# Patient Record
Sex: Male | Born: 1935 | Race: White | Hispanic: No | Marital: Married | Smoking: Former smoker
Health system: Southern US, Community
[De-identification: ages and names within clinical notes are randomized; demographics above are authoritative.]

## PROBLEM LIST (undated history)

## (undated) DIAGNOSIS — I639 Cerebral infarction, unspecified: Secondary | ICD-10-CM

## (undated) DIAGNOSIS — J45909 Unspecified asthma, uncomplicated: Secondary | ICD-10-CM

## (undated) DIAGNOSIS — E119 Type 2 diabetes mellitus without complications: Secondary | ICD-10-CM

## (undated) DIAGNOSIS — K4031 Unilateral inguinal hernia, with obstruction, without gangrene, recurrent: Secondary | ICD-10-CM

## (undated) DIAGNOSIS — R42 Dizziness and giddiness: Secondary | ICD-10-CM

## (undated) DIAGNOSIS — E162 Hypoglycemia, unspecified: Secondary | ICD-10-CM

## (undated) HISTORY — PX: HAND SURGERY: SHX662

## (undated) HISTORY — PX: APPENDECTOMY: SHX54

## (undated) HISTORY — PX: TONSILLECTOMY: SUR1361

## (undated) HISTORY — PX: CHOLECYSTECTOMY: SHX55

## (undated) HISTORY — PX: OTHER SURGICAL HISTORY: SHX169

## (undated) HISTORY — PX: HERNIA REPAIR: SHX51

## (undated) HISTORY — PX: EYE SURGERY: SHX253

---

## 1968-07-10 HISTORY — PX: LAPAROSCOPIC GASTROTOMY W/ REPAIR OF ULCER: SUR772

## 1999-05-17 ENCOUNTER — Encounter: Admission: RE | Admit: 1999-05-17 | Discharge: 1999-05-17 | Payer: Self-pay | Admitting: Gastroenterology

## 1999-05-17 ENCOUNTER — Encounter: Payer: Self-pay | Admitting: Gastroenterology

## 1999-10-18 ENCOUNTER — Ambulatory Visit (HOSPITAL_COMMUNITY): Admission: RE | Admit: 1999-10-18 | Discharge: 1999-10-18 | Payer: Self-pay | Admitting: Gastroenterology

## 2001-10-15 ENCOUNTER — Encounter (INDEPENDENT_AMBULATORY_CARE_PROVIDER_SITE_OTHER): Payer: Self-pay | Admitting: Specialist

## 2001-10-15 ENCOUNTER — Ambulatory Visit (HOSPITAL_BASED_OUTPATIENT_CLINIC_OR_DEPARTMENT_OTHER): Admission: RE | Admit: 2001-10-15 | Discharge: 2001-10-15 | Payer: Self-pay | Admitting: Orthopedic Surgery

## 2004-08-23 ENCOUNTER — Ambulatory Visit (HOSPITAL_COMMUNITY): Admission: RE | Admit: 2004-08-23 | Discharge: 2004-08-23 | Payer: Self-pay | Admitting: Gastroenterology

## 2006-07-10 HISTORY — PX: DENTAL SURGERY: SHX609

## 2007-09-23 ENCOUNTER — Encounter: Admission: RE | Admit: 2007-09-23 | Discharge: 2007-09-23 | Payer: Self-pay | Admitting: Orthopedic Surgery

## 2007-09-26 ENCOUNTER — Ambulatory Visit (HOSPITAL_BASED_OUTPATIENT_CLINIC_OR_DEPARTMENT_OTHER): Admission: RE | Admit: 2007-09-26 | Discharge: 2007-09-26 | Payer: Self-pay | Admitting: Orthopedic Surgery

## 2007-09-26 ENCOUNTER — Encounter (INDEPENDENT_AMBULATORY_CARE_PROVIDER_SITE_OTHER): Payer: Self-pay | Admitting: Orthopedic Surgery

## 2007-12-21 ENCOUNTER — Emergency Department: Payer: Self-pay | Admitting: Emergency Medicine

## 2009-11-06 ENCOUNTER — Emergency Department: Payer: Self-pay | Admitting: Emergency Medicine

## 2010-07-31 ENCOUNTER — Encounter: Payer: Self-pay | Admitting: Internal Medicine

## 2010-11-22 NOTE — Op Note (Signed)
Craig Vazquez, Craig Vazquez                ACCOUNT NO.:  [id number]   MEDICAL RECORD NO.:  [id number]          PATIENT TYPE:  AMB   LOCATION:  DSC                          FACILITY:  [facility]   PHYSICIAN:  Katy Fitch. Sypher, M.D. DATE OF BIRTH:  04-10-1931   DATE OF PROCEDURE:  09/26/2007  DATE OF DISCHARGE:                               OPERATIVE REPORT   PREOPERATIVE DIAGNOSIS:  Complex recurrent diffuse Dupuytren's  contracture left hand involving index, long, ring and small finger  status post prior surgery to index finger and prior surgeries for  primary excision of palmar fibromatosis.   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   OPERATION:  1. Resection of Dupuytren's contracture from left palm and ring      finger.  2. Resection of Dupuytren's contracture from left palm and small      finger.  3. Resection Dupuytren's contracture from palm and left long finger.   OPERATING SURGEON:  Katy Fitch. Sypher, M.D.   ASSISTANT:  Molly Maduro Dasnoit PA-C.   ANESTHESIA:  General by LMA without supplementary regional block.   SUPERVISING ANESTHESIOLOGIST:  Zenon Mayo, MD.   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  He has had four prior surgeries for palmar  fibromatosis.  His initial index surgeries were while serving in the  [employer].   He has had recurrence of his disease and now presents with profound  flexion contractures of his long, ring and small finger of the left hand  with extensive palmar disease.   Preoperatively, we advised him that due to his age of 26, the presence  of osteoarthrosis and his severe  persistent disease, he should not  expect correction of his flexion contractures.   Our goal was to palliate his predicament by removal of the nodular  palmar fibromatosis and relieve his MP and PIP flexion contractures as  much as technically possible.   Preoperatively, he was advised of the potential risks of joint stiffness  due to his underlying osteoarthritis.   Questions were invited and answered in detail both in the office and in  the holding area.   PROCEDURE:  Craig Vazquez is brought to the operating room and placed in  supine position upon the operating table.   Preoperatively, Dr. Sampson Goon provided anesthesia consult and offered a  regional block.  Craig Vazquez declined the block.   He is brought to room one of the surgical center, placed in supine  position on the operating table and under Dr. Jarrett Ables strict  supervision, general anesthesia by LMA technique induced.   One gram of Ancef was administered as an IV prophylactic antibiotic  followed by routine Betadine scrub and paint of left upper extremity.   The arm was draped with sterile stockinette and impervious arthroscopy  drapes followed by exsanguination of the limb with Esmarch bandage and  inflation __________ mmHg.   Procedure commenced with planning of Brunner zigzag incisions to be  extended to V-Y advancement flaps for skin lengthening on the palmar  surfaces of the fingers.   The skin flaps were taken sharply with scalpel and scissors dissection  exposing the palmar fascia.  Extensive dissection was accomplished in  the ring finger extending from the carpal canal distally to the DIP  joint and extensive nodular disease was present along the lateral  fascial sheets, spiral bands were present, central cords and extensive  involvement with the natatory ligaments.   After meticulous dissection, we  were able to fully correct the PIP  joint flexion contractures of the long, ring and small  fingers.   Due to the underlying osteoarthrosis, I elected to inject the PIP joints  of the long and ring fingers that were most swollen and involved with  osteoarthritis with Depo-Medrol 40 mg/mL and 1% lidocaine through both  the dorsal and palmar approach in an effort to minimize an arthritic  flare response following mobilization.   The tourniquet was released after 120 minutes and hemostasis achieved  with bipolar cautery and direct pressure.  The wounds were thoroughly  irrigated, followed by closure with corner sutures of 5-0 nylon and  multiple interrupted sutures of 5-0 nylon.   The wounds were dressed with Silvadene, Adaptic, sterile gauze, sterile  Webril and a volar plaster splint maintaining the MP and PIP joints in  full extension.  There were no apparent complications.   Upon tourniquet release, there was immediate capillary refill to all  fingers and normal turgor within 30 seconds.      Katy Fitch Sypher, M.D.  Electronically Signed     RVS/MEDQ  D:  09/26/2007  T:  09/26/2007  Job:  [id number]   cc:   [name], MD

## 2010-11-25 NOTE — Op Note (Signed)
[facility]  Patient:    Craig Vazquez, Craig Vazquez Visit Number: [id number] MRN: [id number]          Service Type: DSU Location: [facility] Attending Physician:  Susa Day Dictated by:   Katy Fitch Naaman Plummer., M.D. Proc. 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  OPERATION PERFORMED:  Excision of recurrent Dupuytrens disease, right small finger with V-Y advancement flaps for skin coverage x 3.  Proximal interphalangeal release with volar plate proximal release.  SURGEON:  Katy Fitch. Sypher, Montez Hageman., M.D.  ASSISTANT:  Jonni Sanger, P.A.  ANESTHESIA:  General by LMA.  SUPERVISING ANESTHESIOLOGIST:  Dr. Gelene Mink.  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  Approximately 30 years ago while in the [employer] hospital system, he had resection of an extensive Dupuytrens contracture from his right palm, long, ring and small fingers.  He did quite well for nearly 25 years but then had a severe recurrence in his small finger with development of a 90 degree flexion contracture of the PIP joint and hyperextension of the DIP joint with a pseudoboutonniere posture. He had intact sensibility to his fingertip and was able to flex his finger to the palm without full  flexion at the DIP joint due to contracture of the spiral oblique retinacular ligament.  He requested repeat excision.  Preoperatively we advised him that recurrent Dupuytrens and subsequent surgery for recurrent disease can be fraught with many complications including injury to nerves, blood vessels and a chance of recurrence of the disease rather promptly.  We discussed the possible need for skin grafting and possible neurovascular repairs.  Despite these risks and benefits, due to his degree of frustration with his finger, he proceeds at this time.  DESCRIPTION OF PROCEDURE:  Craig Vazquez was brought to the operating room and placed in supine position on the operating table.  Following induction of general anesthesia by LMA, the right arm was prepped with Betadine soap and solution and sterilely draped.  Following exsanguination of the limb with an Esmarch bandage, the arterial tourniquet on the proximal brachium was inflated to 220 mHg.  The procedure commenced with planning of Brunner zigzag incision extending from the level of the metacarpal neck distally to the DIP joint.  The skin incisions were taken sharply with very careful undermining of the dermis revealing a very complex recurrent Dupuytrens involving the pretendinous fibers to the small and ring fingers extending along the lateral fascial sheath to the fingers.  Spiral bands on the radial and ulnar aspect of the finger and extensive central cord distally causing a PIP flexion contracture.  A very tedious and meticulous dissection was performed along the radial and ulnar neurovascular bundles releasing on the ulnar side extensions to the abductor digiti minimi and dense involvement of Graysons ligaments. With the complete removal of all the  pathologic fascia we are able to preserve the radial and ulnar proper digital arteries as well as the main portion of the radial and ulnar proper digital nerves.  Several cutaneous  perforating branches of the proper digital nerves were necessarily sacrificed with the removal of the extensive fibromatosis.  With removal of the pretendinous fibers, central cord, lateral fascial sheaths and spiral bands, the MP recovered full passive extension and the PIP joint extended to approximately 30 degree flexion contracture.  I could not identify other significant fascial disease therefore the PIP joint was addressed by release of the proximal swallow tails of the volar plate.  With gentle manipulation, the finger easily extended to 0 degrees.  Care was taken to observe the neurovascular bundles with extension.  There was no sign of injury and indeed, there was apparently redundancy in the vessels. With the PIP in full extension, there was noted to be 60 degrees of passive flexion of the DIP joint.  Due to skin contracture, the PIP joint was pinned in approximately 5 degrees flexion.  The skin flaps were then tailored with V-Y advancement flaps and closed with multiple corner sutures of 5-0 nylon and interrupted mattress sutures of 5-0 nylon.  The tourniquet was released with immediate capillary refill to the ring, long, index and thumb.  The small finger was warmed with warm saline and a 0.25% Marcaine block was placed at the metacarpal head level.  Within five minutes, the finger had good capillary refill and turgor.  It has been my experience with release of significant contractures, vasospasm is a common predicament. There appeared, however, to be satisfactory capillary refill with the PIP joint in 5 degrees flexion.  The wounds were then dressed with Xeroflo, sterile gauze, Silvadene, fluff, sterile Webril and sandwich splints maintaining the hand in safe position. There were no apparent complications.  The patient tolerated the surgery and anesthesia well and was transferred to the recovery room with stable vital signs.  For aftercare he was given prescriptions  for Percocet 5/325 one or two tablets p.o. q.4-6h. p.r.n. pain.  Also Keflex 500 mg 1 p.o. q.8h. x 4 days as  prophylactic antibiotic.  Motrin 600 mg 1 p.o. q.6h. p.r.n. pain, 30 tablets without refill. Dictated by:   Katy Fitch Naaman Plummer., M.D. Attending Physician:  Susa Day DD:  10/15/01 TD:  10/15/01 Job: [id number]

## 2011-04-03 LAB — POCT HEMOGLOBIN-HEMACUE: Hemoglobin: 14.9

## 2011-10-19 DIAGNOSIS — Z961 Presence of intraocular lens: Secondary | ICD-10-CM | POA: Diagnosis not present

## 2012-01-02 DIAGNOSIS — J45909 Unspecified asthma, uncomplicated: Secondary | ICD-10-CM | POA: Diagnosis not present

## 2012-01-02 DIAGNOSIS — K279 Peptic ulcer, site unspecified, unspecified as acute or chronic, without hemorrhage or perforation: Secondary | ICD-10-CM | POA: Diagnosis not present

## 2012-01-02 DIAGNOSIS — E782 Mixed hyperlipidemia: Secondary | ICD-10-CM | POA: Diagnosis not present

## 2012-01-02 DIAGNOSIS — R972 Elevated prostate specific antigen [PSA]: Secondary | ICD-10-CM | POA: Diagnosis not present

## 2012-01-02 DIAGNOSIS — J309 Allergic rhinitis, unspecified: Secondary | ICD-10-CM | POA: Diagnosis not present

## 2012-04-29 ENCOUNTER — Ambulatory Visit (INDEPENDENT_AMBULATORY_CARE_PROVIDER_SITE_OTHER): Payer: Medicare Other | Admitting: Ophthalmology

## 2012-04-29 DIAGNOSIS — H43819 Vitreous degeneration, unspecified eye: Secondary | ICD-10-CM

## 2012-04-29 DIAGNOSIS — H3530 Unspecified macular degeneration: Secondary | ICD-10-CM

## 2012-05-22 ENCOUNTER — Ambulatory Visit (INDEPENDENT_AMBULATORY_CARE_PROVIDER_SITE_OTHER): Payer: Medicare Other | Admitting: Ophthalmology

## 2012-05-22 DIAGNOSIS — H3530 Unspecified macular degeneration: Secondary | ICD-10-CM

## 2012-05-22 DIAGNOSIS — Z0000 Encounter for general adult medical examination without abnormal findings: Secondary | ICD-10-CM

## 2012-07-08 DIAGNOSIS — L0591 Pilonidal cyst without abscess: Secondary | ICD-10-CM | POA: Diagnosis not present

## 2012-07-08 DIAGNOSIS — E782 Mixed hyperlipidemia: Secondary | ICD-10-CM | POA: Diagnosis not present

## 2012-07-08 DIAGNOSIS — J45909 Unspecified asthma, uncomplicated: Secondary | ICD-10-CM | POA: Diagnosis not present

## 2012-07-08 DIAGNOSIS — R972 Elevated prostate specific antigen [PSA]: Secondary | ICD-10-CM | POA: Diagnosis not present

## 2012-07-08 DIAGNOSIS — Z1331 Encounter for screening for depression: Secondary | ICD-10-CM | POA: Diagnosis not present

## 2012-07-08 DIAGNOSIS — R7309 Other abnormal glucose: Secondary | ICD-10-CM | POA: Diagnosis not present

## 2012-07-08 DIAGNOSIS — Z0000 Encounter for general adult medical examination without abnormal findings: Secondary | ICD-10-CM | POA: Diagnosis not present

## 2012-07-15 ENCOUNTER — Ambulatory Visit (INDEPENDENT_AMBULATORY_CARE_PROVIDER_SITE_OTHER): Payer: Medicare Other | Admitting: Surgery

## 2012-07-15 ENCOUNTER — Encounter (INDEPENDENT_AMBULATORY_CARE_PROVIDER_SITE_OTHER): Payer: Self-pay | Admitting: Surgery

## 2012-07-15 VITALS — BP 152/80 | HR 85 | Temp 97.8°F | Resp 18 | Ht 69.75 in | Wt 167.8 lb

## 2012-07-15 DIAGNOSIS — K6289 Other specified diseases of anus and rectum: Secondary | ICD-10-CM | POA: Diagnosis not present

## 2012-07-15 NOTE — Patient Instructions (Signed)
Return in 2 months for recheck.

## 2012-07-15 NOTE — Progress Notes (Signed)
Patient ID: Craig Vazquez, male   DOB: [birth year], 79 y.o.   MRN: [id number]  Chief Complaint  Patient presents with  . New Evaluation    pilo cyst    HPI [name] is a 79 y.o. male.  Patient sent at the request of Dr. Eula Listen do to cyst near its. One month ago he developed swelling and pain just posterior to his anal canal and this drained. The pain is since gone away. He has no further swelling, drainage or pain. He denies any fever, chills or aches. Bowel movements are normal. HPI  No past medical history on file.  Past Surgical History  Procedure Date  . Hand surgery     rt hand  . Laparoscopic gastrotomy w/ repair of ulcer 1970  . Herina     Family History  Problem Relation Age of Onset  . Cancer Brother     lung ca x 2 brothers    Social History History  Substance Use Topics  . Smoking status: Former Games developer  . Smokeless tobacco: Not on file     Comment: quit 1970  . Alcohol Use: Not on file    No Known Allergies  Current Outpatient Prescriptions  Medication Sig Dispense Refill  . cephALEXin (KEFLEX) 500 MG capsule Take 500 mg by mouth 3 (three) times daily.      . fluticasone (FLONASE) 50 MCG/ACT nasal spray Place 2 sprays into the nose daily.      . flunisolide (NASAREL) 29 MCG/ACT (0.025%) nasal spray Place 2 sprays into the nose 2 (two) times daily. Dose is for each nostril.        Review of Systems Review of Systems  Constitutional: Negative for fever, chills and unexpected weight change.  HENT: Negative for hearing loss, congestion, sore throat, trouble swallowing and voice change.   Eyes: Negative for visual disturbance.  Respiratory: Negative for cough and wheezing.   Cardiovascular: Negative for chest pain, palpitations and leg swelling.  Gastrointestinal: Negative for nausea, vomiting, abdominal pain, diarrhea, constipation, blood in stool, abdominal distention, anal bleeding and rectal pain.  Genitourinary: Negative for hematuria and  difficulty urinating.  Musculoskeletal: Negative for arthralgias.  Skin: Negative for rash and wound.  Neurological: Negative for seizures, syncope, weakness and headaches.  Hematological: Negative for adenopathy. Does not bruise/bleed easily.  Psychiatric/Behavioral: Negative for confusion.    Blood pressure 152/80, pulse 85, temperature 97.8 F (36.6 C), temperature source Oral, resp. rate 18, height 5' 9.75" (1.772 m), weight 167 lb 12.8 oz (76.114 kg).  Physical Exam Physical Exam  Constitutional: He is oriented to person, place, and time. He appears well-developed and well-nourished.  HENT:  Head: Normocephalic and atraumatic.  Eyes: EOM are normal. Pupils are equal, round, and reactive to light.  Neck: Normal range of motion. Neck supple.  Pulmonary/Chest: Effort normal and breath sounds normal.  Genitourinary: Rectal exam shows no mass and no tenderness.     Musculoskeletal: Normal range of motion.  Neurological: He is alert and oriented to person, place, and time.  Skin: Skin is warm and dry.  Psychiatric: He has a normal mood and affect. His behavior is normal. Judgment and thought content normal.      Assessment    Proctalgia with probable history of perirectal abscess  resolved    Plan    Return in 2 months for recheck.  20 % risk of fistula en ano.       Craig Vazquez,Nima A. 07/15/2012, 2:29 PM

## 2012-08-02 DIAGNOSIS — H905 Unspecified sensorineural hearing loss: Secondary | ICD-10-CM | POA: Diagnosis not present

## 2012-08-02 DIAGNOSIS — H6120 Impacted cerumen, unspecified ear: Secondary | ICD-10-CM | POA: Diagnosis not present

## 2012-08-05 DIAGNOSIS — H903 Sensorineural hearing loss, bilateral: Secondary | ICD-10-CM | POA: Diagnosis not present

## 2012-08-05 DIAGNOSIS — H8110 Benign paroxysmal vertigo, unspecified ear: Secondary | ICD-10-CM | POA: Diagnosis not present

## 2012-08-05 DIAGNOSIS — R42 Dizziness and giddiness: Secondary | ICD-10-CM | POA: Diagnosis not present

## 2012-08-07 DIAGNOSIS — H8110 Benign paroxysmal vertigo, unspecified ear: Secondary | ICD-10-CM | POA: Diagnosis not present

## 2012-08-27 DIAGNOSIS — H8110 Benign paroxysmal vertigo, unspecified ear: Secondary | ICD-10-CM | POA: Diagnosis not present

## 2012-09-10 DIAGNOSIS — H8110 Benign paroxysmal vertigo, unspecified ear: Secondary | ICD-10-CM | POA: Diagnosis not present

## 2012-09-10 DIAGNOSIS — R42 Dizziness and giddiness: Secondary | ICD-10-CM | POA: Diagnosis not present

## 2012-09-12 ENCOUNTER — Encounter (INDEPENDENT_AMBULATORY_CARE_PROVIDER_SITE_OTHER): Payer: TRICARE For Life (TFL) | Admitting: Surgery

## 2012-09-27 ENCOUNTER — Encounter (INDEPENDENT_AMBULATORY_CARE_PROVIDER_SITE_OTHER): Payer: Medicare Other | Admitting: Surgery

## 2012-10-24 DIAGNOSIS — Z961 Presence of intraocular lens: Secondary | ICD-10-CM | POA: Diagnosis not present

## 2012-11-07 ENCOUNTER — Inpatient Hospital Stay (HOSPITAL_COMMUNITY): Payer: Medicare Other

## 2012-11-07 ENCOUNTER — Encounter (HOSPITAL_COMMUNITY): Payer: Self-pay | Admitting: Emergency Medicine

## 2012-11-07 ENCOUNTER — Emergency Department (HOSPITAL_COMMUNITY): Payer: Medicare Other

## 2012-11-07 ENCOUNTER — Inpatient Hospital Stay (HOSPITAL_COMMUNITY)
Admission: EM | Admit: 2012-11-07 | Discharge: 2012-11-09 | DRG: 066 | Disposition: A | Discharge status: Home / Self Care | Payer: Medicare Other | Attending: Internal Medicine | Admitting: Internal Medicine

## 2012-11-07 DIAGNOSIS — I6509 Occlusion and stenosis of unspecified vertebral artery: Secondary | ICD-10-CM | POA: Diagnosis not present

## 2012-11-07 DIAGNOSIS — H8110 Benign paroxysmal vertigo, unspecified ear: Secondary | ICD-10-CM | POA: Diagnosis not present

## 2012-11-07 DIAGNOSIS — R4701 Aphasia: Secondary | ICD-10-CM | POA: Diagnosis present

## 2012-11-07 DIAGNOSIS — Z87891 Personal history of nicotine dependence: Secondary | ICD-10-CM

## 2012-11-07 DIAGNOSIS — I669 Occlusion and stenosis of unspecified cerebral artery: Secondary | ICD-10-CM | POA: Diagnosis not present

## 2012-11-07 DIAGNOSIS — J45909 Unspecified asthma, uncomplicated: Secondary | ICD-10-CM | POA: Diagnosis not present

## 2012-11-07 DIAGNOSIS — I6350 Cerebral infarction due to unspecified occlusion or stenosis of unspecified cerebral artery: Secondary | ICD-10-CM

## 2012-11-07 DIAGNOSIS — G459 Transient cerebral ischemic attack, unspecified: Secondary | ICD-10-CM | POA: Diagnosis not present

## 2012-11-07 DIAGNOSIS — I6340 Cerebral infarction due to embolism of unspecified cerebral artery: Secondary | ICD-10-CM | POA: Diagnosis present

## 2012-11-07 DIAGNOSIS — R4789 Other speech disturbances: Secondary | ICD-10-CM | POA: Diagnosis not present

## 2012-11-07 DIAGNOSIS — R471 Dysarthria and anarthria: Secondary | ICD-10-CM | POA: Diagnosis present

## 2012-11-07 DIAGNOSIS — I639 Cerebral infarction, unspecified: Secondary | ICD-10-CM

## 2012-11-07 DIAGNOSIS — I498 Other specified cardiac arrhythmias: Secondary | ICD-10-CM | POA: Diagnosis not present

## 2012-11-07 DIAGNOSIS — Z79899 Other long term (current) drug therapy: Secondary | ICD-10-CM

## 2012-11-07 DIAGNOSIS — I519 Heart disease, unspecified: Secondary | ICD-10-CM | POA: Diagnosis not present

## 2012-11-07 DIAGNOSIS — I6789 Other cerebrovascular disease: Secondary | ICD-10-CM | POA: Diagnosis not present

## 2012-11-07 HISTORY — DX: Dizziness and giddiness: R42

## 2012-11-07 HISTORY — DX: Unspecified asthma, uncomplicated: J45.909

## 2012-11-07 LAB — POCT I-STAT TROPONIN I: Troponin i, poc: 0 ng/mL (ref 0.00–0.08)

## 2012-11-07 LAB — COMPREHENSIVE METABOLIC PANEL
ALT: 12 U/L (ref 0–53)
AST: 23 U/L (ref 0–37)
Albumin: 4.3 g/dL (ref 3.5–5.2)
Alkaline Phosphatase: 51 U/L (ref 39–117)
BUN: 12 mg/dL (ref 6–23)
CO2: 24 mEq/L (ref 19–32)
Calcium: 9.5 mg/dL (ref 8.4–10.5)
Chloride: 103 mEq/L (ref 96–112)
Creatinine, Ser: 1.22 mg/dL (ref 0.50–1.35)
GFR calc Af Amer: 63 mL/min — ABNORMAL LOW (ref >90)
GFR calc non Af Amer: 55 mL/min — ABNORMAL LOW (ref >90)
Glucose, Bld: 121 mg/dL — ABNORMAL HIGH (ref 70–99)
Potassium: 3.9 mEq/L (ref 3.5–5.1)
Sodium: 139 mEq/L (ref 135–145)
Total Bilirubin: 0.4 mg/dL (ref 0.3–1.2)
Total Protein: 7.2 g/dL (ref 6.0–8.3)

## 2012-11-07 LAB — DIFFERENTIAL
Basophils Absolute: 0 10*3/uL (ref 0.0–0.1)
Basophils Relative: 0 % (ref 0–1)
Eosinophils Absolute: 0.3 10*3/uL (ref 0.0–0.7)
Eosinophils Relative: 3 % (ref 0–5)
Lymphocytes Relative: 17 % (ref 12–46)
Lymphs Abs: 1.5 10*3/uL (ref 0.7–4.0)
Monocytes Absolute: 0.7 10*3/uL (ref 0.1–1.0)
Monocytes Relative: 7 % (ref 3–12)
Neutro Abs: 6.8 10*3/uL (ref 1.7–7.7)
Neutrophils Relative %: 73 % (ref 43–77)

## 2012-11-07 LAB — POCT I-STAT, CHEM 8
BUN: 11 mg/dL (ref 6–23)
Calcium, Ion: 1.17 mmol/L (ref 1.13–1.30)
Chloride: 104 mEq/L (ref 96–112)
Creatinine, Ser: 1.2 mg/dL (ref 0.50–1.35)
Glucose, Bld: 119 mg/dL — ABNORMAL HIGH (ref 70–99)
HCT: 42 % (ref 39.0–52.0)
Hemoglobin: 14.3 g/dL (ref 13.0–17.0)
Potassium: 3.8 mEq/L (ref 3.5–5.1)
Sodium: 139 mEq/L (ref 135–145)
TCO2: 25 mmol/L (ref 0–100)

## 2012-11-07 LAB — CBC
HCT: 39.1 % (ref 39.0–52.0)
Hemoglobin: 13.9 g/dL (ref 13.0–17.0)
MCH: 32 pg (ref 26.0–34.0)
MCHC: 35.5 g/dL (ref 30.0–36.0)
MCV: 90.1 fL (ref 78.0–100.0)
Platelets: 217 10*3/uL (ref 150–400)
RBC: 4.34 MIL/uL (ref 4.22–5.81)
RDW: 12.7 % (ref 11.5–15.5)
WBC: 9.4 10*3/uL (ref 4.0–10.5)

## 2012-11-07 LAB — TROPONIN I: Troponin I: <0.3 ng/mL (ref <0.30)

## 2012-11-07 LAB — APTT: aPTT: 24 seconds (ref 24–37)

## 2012-11-07 LAB — GLUCOSE, CAPILLARY: Glucose-Capillary: 113 mg/dL — ABNORMAL HIGH (ref 70–99)

## 2012-11-07 LAB — PROTIME-INR
INR: 0.96 (ref 0.00–1.49)
Prothrombin Time: 12.7 seconds (ref 11.6–15.2)

## 2012-11-07 MED ORDER — LORAZEPAM 2 MG/ML IJ SOLN
1.0000 mg | Freq: Once | INTRAMUSCULAR | Status: AC
  Administered 2012-11-07: 1 mg via INTRAVENOUS
  Filled 2012-11-07: qty 1

## 2012-11-07 MED ORDER — SALMETEROL XINAFOATE 50 MCG/DOSE IN AEPB
1.0000 | INHALATION_SPRAY | Freq: Two times a day (BID) | RESPIRATORY_TRACT | Status: DC
  Administered 2012-11-08: 1 via RESPIRATORY_TRACT
  Filled 2012-11-07: qty 0

## 2012-11-07 MED ORDER — ASPIRIN 325 MG PO TABS
325.0000 mg | ORAL_TABLET | Freq: Every day | ORAL | Status: DC
  Administered 2012-11-08 – 2012-11-09 (×2): 325 mg via ORAL
  Filled 2012-11-07 (×2): qty 1

## 2012-11-07 MED ORDER — HEPARIN SODIUM (PORCINE) 5000 UNIT/ML IJ SOLN
5000.0000 [IU] | Freq: Three times a day (TID) | INTRAMUSCULAR | Status: DC
  Administered 2012-11-07 – 2012-11-09 (×5): 5000 [IU] via SUBCUTANEOUS
  Filled 2012-11-07 (×8): qty 1

## 2012-11-07 MED ORDER — SODIUM CHLORIDE 0.9 % IV BOLUS (SEPSIS)
500.0000 mL | Freq: Once | INTRAVENOUS | Status: AC
  Administered 2012-11-07: 500 mL via INTRAVENOUS

## 2012-11-07 MED ORDER — FLUTICASONE PROPIONATE 50 MCG/ACT NA SUSP
2.0000 | Freq: Every day | NASAL | Status: DC
  Administered 2012-11-08 – 2012-11-09 (×2): 2 via NASAL
  Filled 2012-11-07: qty 16

## 2012-11-07 MED ORDER — ASPIRIN 81 MG PO CHEW
324.0000 mg | CHEWABLE_TABLET | Freq: Once | ORAL | Status: AC
  Administered 2012-11-07: 324 mg via ORAL
  Filled 2012-11-07: qty 4

## 2012-11-07 MED ORDER — FLUTICASONE PROPIONATE HFA 44 MCG/ACT IN AERO
2.0000 | INHALATION_SPRAY | Freq: Two times a day (BID) | RESPIRATORY_TRACT | Status: DC
  Filled 2012-11-07: qty 10.6

## 2012-11-07 NOTE — H&P (Signed)
[facility] History and Physical  Craig Vazquez ZOX:[id number] DOB: [birth year] DOA: 11/07/2012  Referring physician: ED PCP: [name], MD  Specialists: None  Chief Complaint: Aphasia  HPI: Craig Vazquez is a 79 y.o. male LKW 1650, when his wife left him alone for 10 mins.  On returning she noted that he had garbled speech with some drooling.  No extremity weakness, confusion, fall, or facial droop.  He had been working today outside for several hours in the yard.  Patient was taken to [facility] where code stroke was called but the patient was not given TPA due to improving symptoms.  CT head was negative, MRI is currently being done, neurology has already evaluated and hospitalist has been asked to admit.  Review of Systems: 12 systems reviewed and otherwise negative.  Past Medical History  Diagnosis Date  . Asthma   . Vertigo    Past Surgical History  Procedure Laterality Date  . Hand surgery      rt hand  . Laparoscopic gastrotomy w/ repair of ulcer  1970  . Herina     Social History:  reports that he has quit smoking. He does not have any smokeless tobacco history on file. He reports that he does not drink alcohol or use illicit drugs.   No Known Allergies  Family History  Problem Relation Age of Onset  . Cancer Brother     lung ca x 2 brothers     Prior to Admission medications   Medication Sig Start Date End Date Taking? Authorizing Provider  Budesonide (PULMICORT FLEXHALER) 90 MCG/ACT inhaler Inhale 1 puff into the lungs 2 (two) times daily.   Yes Historical Provider, MD  fluticasone (FLONASE) 50 MCG/ACT nasal spray Place 2 sprays into the nose daily.   Yes Historical Provider, MD  salmeterol (SEREVENT) 50 MCG/DOSE diskus inhaler Inhale 1 puff into the lungs 2 (two) times daily.   Yes Historical Provider, MD   Physical Exam: Filed Vitals:   11/07/12 1915 11/07/12 1930 11/07/12 1945 11/07/12 2000  BP: 155/72 159/73 148/76 136/105  Pulse: 102 99 106 99  Temp:       TempSrc:      Resp: 20 13 25 25   SpO2: 97% 95% 99% 98%    General:  NAD, resting comfortably in bed Eyes: PEERLA EOMI ENT: mucous membranes moist Neck: supple w/o JVD Cardiovascular: RRR w/o MRG Respiratory: CTA B Abdomen: soft, nt, nd, bs+ Skin: no rash nor lesion Musculoskeletal: MAE, full ROM all 4 extremities Psychiatric: normal tone and affect Neurologic: AAOx3, 5/5 strength all 4 extremities, no facial droop, normal finger to nose, following commands without difficulty, but clearly has expressive aphasia with dysarthria.  Labs on Admission:  Basic Metabolic Panel:  Recent Labs Lab 11/07/12 1817 11/07/12 1832  NA 139 139  K 3.9 3.8  CL 103 104  CO2 24  --   GLUCOSE 121* 119*  BUN 12 11  CREATININE 1.22 1.20  CALCIUM 9.5  --    Liver Function Tests:  Recent Labs Lab 11/07/12 1817  AST 23  ALT 12  ALKPHOS 51  BILITOT 0.4  PROT 7.2  ALBUMIN 4.3   No results found for this basename: LIPASE, AMYLASE,  in the last 168 hours No results found for this basename: AMMONIA,  in the last 168 hours CBC:  Recent Labs Lab 11/07/12 1817 11/07/12 1832  WBC 9.4  --   NEUTROABS 6.8  --   HGB 13.9 14.3  HCT 39.1 42.0  MCV 90.1  --   PLT 217  --    Cardiac Enzymes:  Recent Labs Lab 11/07/12 1817  TROPONINI <0.30    BNP (last 3 results) No results found for this basename: PROBNP,  in the last 8760 hours CBG:  Recent Labs Lab 11/07/12 1848  GLUCAP 113*    Radiological Exams on Admission: Ct Head Wo Contrast  11/07/2012  *RADIOLOGY REPORT*  Clinical Data: Code stroke with aphasia and drooling.  CT HEAD WITHOUT CONTRAST  Technique:  Contiguous axial images were obtained from the base of the skull through the vertex without contrast.  Comparison: None.  Findings: The patient had difficulty remaining motionless for the study.  Images are suboptimal.  Small or subtle lesions could be overlooked. There is no evidence for acute infarction, intracranial  hemorrhage, mass lesion, hydrocephalus, or extra-axial fluid.  Mild to moderate atrophy.  Chronic microvascular ischemic change.  Small dystrophic calcification left sylvian fissure, a chronic insult. Calvarium intact. Advanced proximal vascular calcification.  The maxillary sinuses show chronic mucosal thickening with other sinuses grossly clear.  No definite mastoid fluid.  IMPRESSION: Motion degraded exam showing no definite acute infarct or hemorrhage.  Critical Value/emergent results were called by telephone at the time of interpretation on 11/08/2011  at 6:54 p.m. to Dr. Preston Fleeting, who verbally acknowledged these results.   Original Report Authenticated By: Davonna Belling, M.D.     EKG: Independently reviewed.  Assessment/Plan Principal Problem:   Expressive aphasia   1. Expressive aphasia - probably acute ischemic stroke (confirmation with MRI pending), patient on stroke pathway, MRI, MRA head, carotid dopplers, 2d echo all pending, admit to tele monitor, allow permissive HTN (patient isnt on any BP meds at home for [place] to hold), starting ASA 325 daily per neuro recs.  Lipid panel and A1C ordered, neuro checks ordered.  Code Status: Full Code (must indicate code status--if unknown or must be presumed, indicate so) Family Communication: No family in room (indicate person spoken with, if applicable, with phone number if by telephone) Disposition Plan: Admit to inpatient (indicate anticipated LOS)  Time spent: 70 min  Reannon Candella M. [facility] Pager [phone]  If 7PM-7AM, please contact night-coverage [email or web address] Password [facility] 11/07/2012, 9:21 PM

## 2012-11-07 NOTE — ED Notes (Signed)
Patient transported to MRI 

## 2012-11-07 NOTE — ED Provider Notes (Signed)
History     CSN: [id number]  Arrival date & time 11/07/12  4098   First MD Initiated Contact with Patient 11/07/12 1819      Chief Complaint  Patient presents with  . Code Stroke    (Consider location/radiation/quality/duration/timing/severity/associated sxs/prior treatment) The history is provided by the patient.   79 year old male had onset about 5 PM of difficulty speaking. He did not have any headache, vision change, weakness, numbness, chest pain, dyspnea. No difficulty with balance. His daughter states that he had a similar episode but was not is mild several months ago. Symptoms are moderate. Nothing makes it better nothing makes it worse.  Past Medical History  Diagnosis Date  . Asthma   . Vertigo     Past Surgical History  Procedure Laterality Date  . Hand surgery      rt hand  . Laparoscopic gastrotomy w/ repair of ulcer  1970  . Herina      Family History  Problem Relation Age of Onset  . Cancer Brother     lung ca x 2 brothers    History  Substance Use Topics  . Smoking status: Former Games developer  . Smokeless tobacco: Not on file     Comment: quit 1970  . Alcohol Use: No      Review of Systems  All other systems reviewed and are negative.    Allergies  Review of patient's allergies indicates no known allergies.  Home Medications   Current Outpatient Rx  Name  Route  Sig  Dispense  Refill  . Budesonide (PULMICORT FLEXHALER) 90 MCG/ACT inhaler   Inhalation   Inhale 1 puff into the lungs 2 (two) times daily.         . fluticasone (FLONASE) 50 MCG/ACT nasal spray   Nasal   Place 2 sprays into the nose daily.         . salmeterol (SEREVENT) 50 MCG/DOSE diskus inhaler   Inhalation   Inhale 1 puff into the lungs 2 (two) times daily.           BP 155/72  Pulse 102  Temp(Src) 98.5 F (36.9 C) (Oral)  Resp 20  SpO2 97%  Physical Exam  Nursing note and vitals reviewed.  79 year old male, resting comfortably and in no acute distress.  Vital signs are significant for mild tachycardia with heart rate 102, and hypertension with blood pressure 155/72. Oxygen saturation is 97%, which is normal. Head is normocephalic and atraumatic. PERRLA, EOMI. Oropharynx is clear. Neck is nontender and supple without adenopathy or JVD. There no carotid bruits. Back is nontender and there is no CVA tenderness. Lungs are clear without rales, wheezes, or rhonchi. Chest is nontender. Heart has regular rate and rhythm without murmur. Abdomen is soft, flat, nontender without masses or hepatosplenomegaly and peristalsis is normoactive. Extremities have no cyanosis or edema, full range of motion is present. Skin is warm and dry without rash. Neurologic: He is awake, alert, oriented x3, speech is slow with difficulty thinking of words but no dysarthria, cranial nerves are intact, there are no motor or sensory deficits.  ED Course  Procedures (including critical care time)  Results for orders placed during the hospital encounter of 11/07/12  PROTIME-INR      Result Value Range   Prothrombin Time 12.7  11.6 - 15.2 seconds   INR 0.96  0.00 - 1.49  APTT      Result Value Range   aPTT 24  24 - 37 seconds  CBC      Result Value Range   WBC 9.4  4.0 - 10.5 K/uL   RBC 4.34  4.22 - 5.81 MIL/uL   Hemoglobin 13.9  13.0 - 17.0 g/dL   HCT 45.4  09.8 - 11.9 %   MCV 90.1  78.0 - 100.0 fL   MCH 32.0  26.0 - 34.0 pg   MCHC 35.5  30.0 - 36.0 g/dL   RDW 14.7  82.9 - 56.2 %   Platelets 217  150 - 400 K/uL  DIFFERENTIAL      Result Value Range   Neutrophils Relative 73  43 - 77 %   Neutro Abs 6.8  1.7 - 7.7 K/uL   Lymphocytes Relative 17  12 - 46 %   Lymphs Abs 1.5  0.7 - 4.0 K/uL   Monocytes Relative 7  3 - 12 %   Monocytes Absolute 0.7  0.1 - 1.0 K/uL   Eosinophils Relative 3  0 - 5 %   Eosinophils Absolute 0.3  0.0 - 0.7 K/uL   Basophils Relative 0  0 - 1 %   Basophils Absolute 0.0  0.0 - 0.1 K/uL  COMPREHENSIVE METABOLIC PANEL      Result Value  Range   Sodium 139  135 - 145 mEq/L   Potassium 3.9  3.5 - 5.1 mEq/L   Chloride 103  96 - 112 mEq/L   CO2 24  19 - 32 mEq/L   Glucose, Bld 121 (*) 70 - 99 mg/dL   BUN 12  6 - 23 mg/dL   Creatinine, Ser 1.30  0.50 - 1.35 mg/dL   Calcium 9.5  8.4 - 86.5 mg/dL   Total Protein 7.2  6.0 - 8.3 g/dL   Albumin 4.3  3.5 - 5.2 g/dL   AST 23  0 - 37 U/L   ALT 12  0 - 53 U/L   Alkaline Phosphatase 51  39 - 117 U/L   Total Bilirubin 0.4  0.3 - 1.2 mg/dL   GFR calc non Af Amer 55 (*) >90 mL/min   GFR calc Af Amer 63 (*) >90 mL/min  TROPONIN I      Result Value Range   Troponin I <0.30  <0.30 ng/mL  GLUCOSE, CAPILLARY      Result Value Range   Glucose-Capillary 113 (*) 70 - 99 mg/dL   Comment 1 Documented in Chart    POCT I-STAT, CHEM 8      Result Value Range   Sodium 139  135 - 145 mEq/L   Potassium 3.8  3.5 - 5.1 mEq/L   Chloride 104  96 - 112 mEq/L   BUN 11  6 - 23 mg/dL   Creatinine, Ser 7.84  0.50 - 1.35 mg/dL   Glucose, Bld 696 (*) 70 - 99 mg/dL   Calcium, Ion 2.95  2.84 - 1.30 mmol/L   TCO2 25  0 - 100 mmol/L   Hemoglobin 14.3  13.0 - 17.0 g/dL   HCT 13.2  44.0 - 10.2 %  POCT I-STAT TROPONIN I      Result Value Range   Troponin i, poc 0.00  0.00 - 0.08 ng/mL   Comment 3            Ct Head Wo Contrast  11/07/2012  *RADIOLOGY REPORT*  Clinical Data: Code stroke with aphasia and drooling.  CT HEAD WITHOUT CONTRAST  Technique:  Contiguous axial images were obtained from the base of the skull through the vertex without contrast.  Comparison: None.  Findings: The patient had difficulty remaining motionless for the study.  Images are suboptimal.  Small or subtle lesions could be overlooked. There is no evidence for acute infarction, intracranial hemorrhage, mass lesion, hydrocephalus, or extra-axial fluid.  Mild to moderate atrophy.  Chronic microvascular ischemic change.  Small dystrophic calcification left sylvian fissure, a chronic insult. Calvarium intact. Advanced proximal vascular  calcification.  The maxillary sinuses show chronic mucosal thickening with other sinuses grossly clear.  No definite mastoid fluid.  IMPRESSION: Motion degraded exam showing no definite acute infarct or hemorrhage.  Critical Value/emergent results were called by telephone at the time of interpretation on 11/08/2011  at 6:54 p.m. to Dr. Preston Fleeting, who verbally acknowledged these results.   Original Report Authenticated By: Davonna Belling, M.D.     Date: 11/07/2012  Rate: 111  Rhythm: sinus tachycardia  QRS Axis: normal  Intervals: normal  ST/T Wave abnormalities: normal  Conduction Disutrbances:none  Narrative Interpretation: Left atrial hypertrophy. When compared with ECG of 09/23/2007, no significant changes are seen  Old EKG Reviewed: unchanged    1. Stroke    CRITICAL CARE Performed by: Dione Booze   Total critical care time: 35 minutes  Critical care time was exclusive of separately billable procedures and treating other patients.  Critical care was necessary to treat or prevent imminent or life-threatening deterioration.  Critical care was time spent personally by me on the following activities: development of treatment plan with patient and/or surrogate as well as nursing, discussions with consultants, evaluation of patient's response to treatment, examination of patient, obtaining history from patient or surrogate, ordering and performing treatments and interventions, ordering and review of laboratory studies, ordering and review of radiographic studies, pulse oximetry and re-evaluation of patient's condition.    MDM  Stroke with some relatively low NIH scale of 1. I doubt he would be a candidate for thrombolytic to treatment because of low and NIH scale.  Patient has been seen by neurology who agrees that he is not a candidate for thrombolytic treatment and he will be admitted on the medicine service. Case is discussed with Dr. Julian Reil of [facility] who agrees to admit the  patient.        Dione Booze, MD 11/07/12 2003

## 2012-11-07 NOTE — Consult Note (Signed)
Referring Physician: Dr. Preston Fleeting    Chief Complaint: Speech difficulty  HPI: Craig Vazquez is an 79 y.o. male who was LKW 1650 by his wife, she left him alone for 10 minutes, upon return she found him to have garbled speech with some drooling. No extremity weakness, confusion or fall. He has been working outside today for a couple of hours in the yard.  Does not take aspirin. Patient has history of asthma and BPPV. See's Dr. Eula Listen.  Date last known well: 11/07/2012 Time last known well: 1650 NIHSS=2 (speech)  tPA Given: No: resolving symptoms.  Past Medical History  Diagnosis Date  . Asthma   . Vertigo     Past Surgical History  Procedure Laterality Date  . Hand surgery      rt hand  . Laparoscopic gastrotomy w/ repair of ulcer  1970  . Herina      Family History  Problem Relation Age of Onset  . Cancer Brother     lung ca x 2 brothers   Social History:  reports that he has quit smoking. He does not have any smokeless tobacco history on file. He reports that he does not drink alcohol or use illicit drugs.  Allergies: No Known Allergies  Current Facility-Administered Medications  Medication Dose Route Frequency Provider Last Rate Last Dose  . aspirin chewable tablet 324 mg  324 mg Oral Once Noel Christmas      . sodium chloride 0.9 % bolus 500 mL  500 mL Intravenous Once Noel Christmas       Current Outpatient Prescriptions  Medication Sig Dispense Refill  . cephALEXin (KEFLEX) 500 MG capsule Take 500 mg by mouth 3 (three) times daily.      . flunisolide (NASAREL) 29 MCG/ACT (0.025%) nasal spray Place 2 sprays into the nose 2 (two) times daily. Dose is for each nostril.      . fluticasone (FLONASE) 50 MCG/ACT nasal spray Place 2 sprays into the nose daily.         ROS: History obtained from the patient  General ROS: negative for - chills, fatigue, fever, night sweats, weight gain or weight loss Psychological ROS: negative for - behavioral disorder,  hallucinations, memory difficulties, mood swings or suicidal ideation Ophthalmic ROS: negative for - blurry vision, double vision, eye pain or loss of vision ENT ROS: negative for - epistaxis, nasal discharge, oral lesions, sore throat, tinnitus or vertigo Allergy and Immunology ROS: negative for - hives or itchy/watery eyes Hematological and Lymphatic ROS: negative for - bleeding problems, bruising or swollen lymph nodes Endocrine ROS: negative for - galactorrhea, hair pattern changes, polydipsia/polyuria or temperature intolerance Respiratory ROS: negative for - cough, hemoptysis, shortness of breath or wheezing Cardiovascular ROS: negative for - chest pain, dyspnea on exertion, edema or irregular heartbeat Gastrointestinal ROS: negative for - abdominal pain, diarrhea, hematemesis, nausea/vomiting or stool incontinence Genito-Urinary ROS: negative for - dysuria, hematuria, incontinence or urinary frequency/urgency Musculoskeletal ROS: negative for - joint swelling or muscular weakness Neurological ROS: as noted in HPI Dermatological ROS: negative for rash and skin lesion changes   Physical Examination: Blood pressure 177/82, pulse 110, resp. rate 18, SpO2 97.00%.  Neurologic Examination: Mental Status: Alert, oriented, thought content appropriate.  Expressive aphasia. Dysarthric.  Able to follow 3 step commands without difficulty. Cranial Nerves: II: visual fields grossly normal, pupils equal, round, reactive to light and accommodation III,IV, VI: ptosis not present, extra-ocular motions intact bilaterally V,VII: smile symmetric, facial light touch sensation normal bilaterally VIII: hard  of hearing IX,X: gag reflex present XI: trapezius strength/neck flexion strength normal bilaterally XII: tongue strength normal, appears dehydrated-dry mouth Motor: Right : Upper extremity   5/5    Left:     Upper extremity   5/5  Lower extremity   5/5     Lower extremity   5/5 Tone and bulk:normal  tone throughout; no atrophy noted Sensory: Pinprick and light touch intact throughout, bilaterally Deep Tendon Reflexes: 2+ and symmetric throughout knee distally Plantars: Right: downgoing   Left: downgoing Cerebellar: normal finger-to-nose   Results for orders placed during the hospital encounter of 11/07/12 (from the past 48 hour(s))  PROTIME-INR     Status: None   Collection Time    11/07/12  6:17 PM      Result Value Range   Prothrombin Time 12.7  11.6 - 15.2 seconds   INR 0.96  0.00 - 1.49  APTT     Status: None   Collection Time    11/07/12  6:17 PM      Result Value Range   aPTT 24  24 - 37 seconds  CBC     Status: None   Collection Time    11/07/12  6:17 PM      Result Value Range   WBC 9.4  4.0 - 10.5 K/uL   RBC 4.34  4.22 - 5.81 MIL/uL   Hemoglobin 13.9  13.0 - 17.0 g/dL   HCT 13.0  86.5 - 78.4 %   MCV 90.1  78.0 - 100.0 fL   MCH 32.0  26.0 - 34.0 pg   MCHC 35.5  30.0 - 36.0 g/dL   RDW 69.6  29.5 - 28.4 %   Platelets 217  150 - 400 K/uL  DIFFERENTIAL     Status: None   Collection Time    11/07/12  6:17 PM      Result Value Range   Neutrophils Relative 73  43 - 77 %   Neutro Abs 6.8  1.7 - 7.7 K/uL   Lymphocytes Relative 17  12 - 46 %   Lymphs Abs 1.5  0.7 - 4.0 K/uL   Monocytes Relative 7  3 - 12 %   Monocytes Absolute 0.7  0.1 - 1.0 K/uL   Eosinophils Relative 3  0 - 5 %   Eosinophils Absolute 0.3  0.0 - 0.7 K/uL   Basophils Relative 0  0 - 1 %   Basophils Absolute 0.0  0.0 - 0.1 K/uL  POCT I-STAT TROPONIN I     Status: None   Collection Time    11/07/12  6:29 PM      Result Value Range   Troponin i, poc 0.00  0.00 - 0.08 ng/mL   Comment 3            Comment: Due to the release kinetics of cTnI,     a negative result within the first hours     of the onset of symptoms does not rule out     myocardial infarction with certainty.     If myocardial infarction is still suspected,     repeat the test at appropriate intervals.  POCT I-STAT, CHEM 8      Status: Abnormal   Collection Time    11/07/12  6:32 PM      Result Value Range   Sodium 139  135 - 145 mEq/L   Potassium 3.8  3.5 - 5.1 mEq/L   Chloride 104  96 - 112 mEq/L  BUN 11  6 - 23 mg/dL   Creatinine, Ser 1.61  0.50 - 1.35 mg/dL   Glucose, Bld 096 (*) 70 - 99 mg/dL   Calcium, Ion 0.45  4.09 - 1.30 mmol/L   TCO2 25  0 - 100 mmol/L   Hemoglobin 14.3  13.0 - 17.0 g/dL   HCT 81.1  91.4 - 78.2 %   No results found.  Assessment: 79 y.o. male with expressive aphasia/dysarthria which is intermittent, improved per patient. Tachycardic with dehydration. Will rehydrate with fluid bolus. Continue stroke workup. Add aspirin.  Stroke Risk Factors - none  Plan: 1. HgbA1c, fasting lipid panel 2. MRI, MRA  of the brain without contrast 3. PT consult, OT consult, Speech consult 4. Echocardiogram 5. Carotid dopplers 6. Prophylactic therapy-Antiplatelet med: Aspirin - dose 325mg  7. Risk factor modification 8. Telemetry monitoring 9. Frequent neuro checks   Guy Franco PA-C, MBA, MHA [facility] Pager [phone]  This patient was examined by me along with the physician assistant and I approved the above clinical assessment and management recommendations.  Venetia Maxon M.D. [facility] [phone]  11/07/2012, 6:48 PM

## 2012-11-07 NOTE — ED Notes (Signed)
Patient working at home sweeping and wife last saw patient normal 1650 and discovered patient having slurred speech at 1700. EMS called Ax4 with slurred speech and slight left side drool. Alert answering and following commands appropriate.

## 2012-11-08 DIAGNOSIS — J45909 Unspecified asthma, uncomplicated: Secondary | ICD-10-CM | POA: Diagnosis not present

## 2012-11-08 DIAGNOSIS — H8110 Benign paroxysmal vertigo, unspecified ear: Secondary | ICD-10-CM | POA: Diagnosis not present

## 2012-11-08 DIAGNOSIS — R4701 Aphasia: Secondary | ICD-10-CM | POA: Diagnosis not present

## 2012-11-08 DIAGNOSIS — I6350 Cerebral infarction due to unspecified occlusion or stenosis of unspecified cerebral artery: Secondary | ICD-10-CM | POA: Diagnosis not present

## 2012-11-08 DIAGNOSIS — I519 Heart disease, unspecified: Secondary | ICD-10-CM

## 2012-11-08 LAB — HEMOGLOBIN A1C
Hgb A1c MFr Bld: 6 % — ABNORMAL HIGH (ref <5.7)
Mean Plasma Glucose: 126 mg/dL — ABNORMAL HIGH (ref <117)

## 2012-11-08 LAB — LIPID PANEL
Cholesterol: 183 mg/dL (ref 0–200)
HDL: 86 mg/dL (ref >39)
LDL Cholesterol: 83 mg/dL (ref 0–99)
Total CHOL/HDL Ratio: 2.1 RATIO
Triglycerides: 72 mg/dL (ref <150)
VLDL: 14 mg/dL (ref 0–40)

## 2012-11-08 NOTE — Progress Notes (Signed)
  Echocardiogram 2D Echocardiogram has been performed.  Craig Vazquez 11/08/2012, 10:34 AM

## 2012-11-08 NOTE — Progress Notes (Signed)
Stroke Team Progress Note  HISTORY Craig Vazquez is an 79 y.o. male who was LKW 1650 on 11/07/2012 by his wife, she left him alone for 10 minutes, upon return she found him to have garbled speech with some drooling. No extremity weakness, confusion or fall. He has been working outside today for a couple of hours in the yard. Does not take aspirin. Patient has history of asthma and BPPV. See's Dr. Eula Listen. Patient was not a TPA candidate secondary to resolving symptoms. He was admitted  for further evaluation and treatment.  SUBJECTIVE His daughter and grandson are at the bedside.  Overall he feels his condition is gradually improving.   OBJECTIVE Most recent Vital Signs: Filed Vitals:   11/08/12 0014 11/08/12 0157 11/08/12 0420 11/08/12 0617  BP: 126/78 120/58 119/56 120/54  Pulse: 73 73 70 65  Temp: 98.4 F (36.9 C) 98.2 F (36.8 C) 98.2 F (36.8 C) 98.1 F (36.7 C)  TempSrc: Oral Oral Oral Oral  Resp: 16 16 16 16   Height:      Weight:      SpO2: 96% 97% 97% 94%   CBG (last 3)   Recent Labs  11/07/12 1848  GLUCAP 113*    IV Fluid Intake:     MEDICATIONS  . aspirin  325 mg Oral Daily  . fluticasone  2 spray Each Nare Daily  . fluticasone  2 puff Inhalation BID  . heparin  5,000 Units Subcutaneous Q8H  . salmeterol  1 puff Inhalation BID   PRN:    Diet:  Cardiac thin liquids Activity:  Bedrest with Bathroom privileges DVT Prophylaxis:  Heparin 5000 units sq tid   CLINICALLY SIGNIFICANT STUDIES Basic Metabolic Panel:  Recent Labs Lab 11/07/12 1817 11/07/12 1832  NA 139 139  K 3.9 3.8  CL 103 104  CO2 24  --   GLUCOSE 121* 119*  BUN 12 11  CREATININE 1.22 1.20  CALCIUM 9.5  --    Liver Function Tests:  Recent Labs Lab 11/07/12 1817  AST 23  ALT 12  ALKPHOS 51  BILITOT 0.4  PROT 7.2  ALBUMIN 4.3   CBC:  Recent Labs Lab 11/07/12 1817 11/07/12 1832  WBC 9.4  --   NEUTROABS 6.8  --   HGB 13.9 14.3  HCT 39.1 42.0  MCV 90.1  --   PLT 217  --     Coagulation:  Recent Labs Lab 11/07/12 1817  LABPROT 12.7  INR 0.96   Cardiac Enzymes:  Recent Labs Lab 11/07/12 1817  TROPONINI <0.30   Urinalysis: No results found for this basename: COLORURINE, APPERANCEUR, LABSPEC, PHURINE, GLUCOSEU, HGBUR, BILIRUBINUR, KETONESUR, PROTEINUR, UROBILINOGEN, NITRITE, LEUKOCYTESUR,  in the last 168 hours Lipid Panel    Component Value Date/Time   CHOL 183 11/08/2012 0530   TRIG 72 11/08/2012 0530   HDL 86 11/08/2012 0530   CHOLHDL 2.1 11/08/2012 0530   VLDL 14 11/08/2012 0530   LDLCALC 83 11/08/2012 0530   HgbA1C  No results found for this basename: HGBA1C    Urine Drug Screen:   No results found for this basename: labopia, cocainscrnur, labbenz, amphetmu, thcu, labbarb    Alcohol Level: No results found for this basename: ETH,  in the last 168 hours  CT of the brain  11/07/2012  Motion degraded exam showing no definite acute infarct or hemorrhage.   MRI of the brain  11/07/2012   Innumerable punctate foci of acute infarctions scattered throughout the cerebral hemispheres consistent with micro  emboli from a cardiac or ascending aortic source. However, based on a congenital pattern described below, I think it is more likely that this pattern derives from the left carotid system micro emboli.  Background pattern of chronic small vessel disease.  Mucosal inflammation of the paranasal sinuses.    MRA of the brain  11/07/2012   No major vessel occlusion or correctable proximal stenosis.  Small distal right vertebral artery which could be congenital or due to required atherosclerosis.  More distal branch vessels show atherosclerotic change diffusely.  Diminutive A1 segment on the right.  Prominent anterior circulation contribution to the posterior cerebral arteries.  Based on these variations, the embolic pattern is more likely to be due to left carotid disease than cardiac or ascending aortic disease.   2D Echocardiogram    Carotid Doppler    CXR    EKG   sinus tachycardia.   Therapy Recommendations no needs  Physical Exam   Pleasant elderly Caucasian male currently not in distress.Awake alert. Afebrile. Head is nontraumatic. Neck is supple without bruit. Hearing is normal. Cardiac exam no murmur or gallop. Lungs are clear to auscultation. Distal pulses are well felt. Neurological Exam :  Awake  Alert oriented x 3. Normal speech and language.eye movements full without nystagmus.fundi were not visualized. Vision acuity and fields appear normal. Hearing is normal. Palatal movements are normal. Face symmetric. Tongue midline. Normal strength, tone, reflexes and coordination. Normal sensation. Gait deferred. ASSESSMENT [name] is a 79 y.o. male presenting with sudden onset inability to speak (but could type a text). Imaging confirms a multiple bilateral tiny infarcts. Infarct felt to be embolic secondary to unknown source.  On no antiplatlets prior to admission. Now on aspirin 325 mg orally every day for secondary stroke prevention. Patient with resultant improved dysarthria. Work up underway.  LDL 83 HgbA1c pending  Hospital day # 1  TREATMENT/PLAN  Continue aspirin 325 mg orally every day for secondary stroke prevention.  Continue telemetry monitoring to look for atrial fibrillation TEE to look for embolic source. Please arrange with pts cardiologist or cardiologist of choice. If positive for PFO (patent foramen ovale), check bilateral lower extremity venous dopplers to rule out DVT as possible source of stroke. Can be done as an OP next week if discharge desired (TEEs are not typically performed on the weekends.) Please schedule outpatient telemetry monitoring to assess patient for atrial fibrillation as source of stroke. May be arranged with patient's cardiologist, or cardiologist of choice.  F/u carotid dopplers, 2D echo, HgbA1c  SHARON BIBY, MSN, RN, ANVP-BC, ANP-BC, GNP-BC [facility] Pager:  [phone] 11/08/2012 11:38 AM  I have personally obtained a history, examined the patient, evaluated imaging results, and formulated the assessment and plan of care. I agree with the above. Delia Heady, MD

## 2012-11-08 NOTE — Progress Notes (Signed)
PT Evaluation Cancellation Note  Patient Details Name: Craig Vazquez MRN: [id number] DOB: November 05, 1930   Cancelled Treatment:    Reason Eval/Treat Not Completed: Other (comment) (Evaluation cancelled due to Pt with no further PT needs.)  11/08/2012  Cygnet Bing, PT [phone]  (pager) Moneka Mcquinn, Eliseo Gum 11/08/2012, 4:20 PM

## 2012-11-08 NOTE — Progress Notes (Signed)
*  PRELIMINARY RESULTS* Vascular Ultrasound Carotid Duplex (Doppler) has been completed.   There is no obvious evidence of hemodynamically significant internal carotid artery stenosis >40%. Vertebral arteries are patent with antegrade flow.  11/08/2012 3:46 PM Gertie Fey, RDMS, RDCS

## 2012-11-08 NOTE — Evaluation (Signed)
Occupational Therapy Evaluation Patient Details Name: Craig Vazquez MRN: [id number] DOB: 04-Jul-1931 Today's Date: 11/08/2012 Time: 1550-1600 OT Time Calculation (min): 10 min  OT Assessment / Plan / Recommendation Clinical Impression  Pt admitted with expressive aphasia/dysarthria which is intermittent, improved per patient. MRI revealed innumerable punctate foci of acute infarctions scattered throughout the cerebral hemispheres consistent with micro emboli from a cardiac or ascending aortic source. Pt is at baseline (independent) with ADLs and functional mobility.  Educated pt on stroke signs/symptoms.  No further acute OT needed. Signing off.        OT Assessment  Patient does not need any further OT services    Follow Up Recommendations  No OT follow up    Barriers to Discharge      Equipment Recommendations  None recommended by OT    Recommendations for Other Services    Frequency       Precautions / Restrictions     Pertinent Vitals/Pain See vitals    ADL  Grooming: Performed;Wash/dry hands;Independent Where Assessed - Grooming: Unsupported standing Toilet Transfer: Performed;Independent Toilet Transfer Method: Sit to Barista: Comfort height toilet Toileting - Clothing Manipulation and Hygiene: Performed;Independent Where Assessed - Toileting Clothing Manipulation and Hygiene: Sit to stand from 3-in-1 or toilet Transfers/Ambulation Related to ADLs: independent ADL Comments: Pt is at baseline.  Family present and agree that pt is at baseline LOF.  Educated pt on stroke signs and symptoms, and pt verbalized understanding.    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 Pt's daughter is caring for wife currently while pt is in hospital. Communication Communication: No difficulties Dominant Hand: Right         Vision/Perception Vision - History Baseline Vision: Wears glasses all the time   Cognition  Cognition Arousal/Alertness: Awake/alert Behavior During Therapy: WFL for tasks assessed/performed Overall Cognitive Status: Within Functional Limits for tasks assessed    Extremity/Trunk Assessment Right Upper Extremity Assessment RUE ROM/Strength/Tone: [facility] for tasks assessed Left Upper Extremity Assessment LUE ROM/Strength/Tone: WFL for tasks assessed Right Lower Extremity Assessment RLE ROM/Strength/Tone: [facility] for tasks assessed Left Lower Extremity Assessment LLE ROM/Strength/Tone: [facility] for tasks assessed     Mobility Bed Mobility Bed Mobility: Supine to Sit;Sitting - Scoot to Edge of Bed;Sit to Supine Supine to Sit: 7: Independent Sitting - Scoot to Edge of Bed: 7: Independent Sit to Supine: 7: Independent Transfers Transfers: Sit to Stand;Stand to Sit Sit to Stand: 7: Independent;From bed Stand to Sit: 7: Independent;To bed     Exercise     Balance     End of Session OT - End of Session Equipment Utilized During Treatment:  (none) Activity Tolerance: Patient tolerated treatment well Patient left: in bed;with call bell/phone within reach;with family/visitor present Nurse Communication: Mobility status  GO    11/08/2012 Cipriano Mile OTR/L Pager [phone] Office [phone]  Cipriano Mile 11/08/2012, 4:14 PM

## 2012-11-08 NOTE — Evaluation (Signed)
Speech Language Pathology Evaluation Patient Details Name: Craig Vazquez MRN: [id number] DOB: Jul 28, 1930 Today's Date: 11/08/2012 Time: 4098-1191 SLP Time Calculation (min): 15 min  Problem List:  Patient Active Problem List   Diagnosis Date Noted  . Asthma, chronic 11/08/2012  . BPPV (benign paroxysmal positional vertigo) 11/08/2012  . Expressive aphasia 11/07/2012  . 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 Asthma   . Vertigo    Past Surgical History:  Past Surgical History  Procedure Laterality Date  . Hand surgery      rt hand  . Laparoscopic gastrotomy w/ repair of ulcer  1970  . Herina     HPI:  79 y.o. Male with expressive aphasia/dysarthria which is intermittent, improved per patient. MRI revealed innumerable punctate foci of acute infarctions scattered throughout the cerebral hemispheres consistent with micro emboli from a cardiac or ascending aortic source.     Assessment / Plan / Recommendation Clinical Impression  Pt presents with mild dysfluency in verbal expression, but expressive/receptive language are WNL.  Pt reports continued improvements - no SLP f/u warranted.    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   Cognition  Overall Cognitive Status: Within Functional Limits for tasks assessed Orientation Level: Oriented X4    Comprehension  Auditory Comprehension Overall Auditory Comprehension: Appears within functional limits for tasks assessed Visual Recognition/Discrimination Discrimination: Within Function Limits Reading Comprehension Reading Status: Within funtional limits    Expression Expression Primary Mode of Expression: Verbal Verbal Expression Overall Verbal  Expression: Appears within functional limits for tasks assessed Written Expression Dominant Hand: Right Written Expression: Within Functional Limits   Oral / Motor Oral Motor/Sensory Function Overall Oral Motor/Sensory Function: Appears within functional limits for tasks assessed Motor Speech Overall Motor Speech: Appears within functional limits for tasks assessed   GO     Blenda Mounts Laurice 11/08/2012, 2:00 PM

## 2012-11-08 NOTE — Progress Notes (Signed)
Subjective: Pt feel better A xO x3 Speech ok MRI finding noted  Objective: Vital signs in last 24 hours: Temp:  [98.1 F (36.7 C)-98.5 F (36.9 C)] 98.1 F (36.7 C) (05/02 0617) Pulse Rate:  [65-110] 65 (05/02 0617) Resp:  [13-25] 16 (05/02 0617) BP: (119-177)/(54-105) 120/54 mmHg (05/02 0617) SpO2:  [94 %-99 %] 94 % (05/02 0617) Weight:  [72.8 kg (160 lb 7.9 oz)] 72.8 kg (160 lb 7.9 oz) (05/01 2204) Weight change:  Last BM Date: 11/07/12  Intake/Output from previous day:   Intake/Output this shift:    General appearance: alert Resp: clear to auscultation bilaterally Cardio: regular rate and rhythm Neurologic: Alert and oriented X 3, normal strength and tone. Normal symmetric reflexes. Normal coordination and gait  Lab Results:  Recent Labs  11/07/12 1817 11/07/12 1832  WBC 9.4  --   HGB 13.9 14.3  HCT 39.1 42.0  PLT 217  --    BMET  Recent Labs  11/07/12 1817 11/07/12 1832  NA 139 139  K 3.9 3.8  CL 103 104  CO2 24  --   GLUCOSE 121* 119*  BUN 12 11  CREATININE 1.22 1.20  CALCIUM 9.5  --     Studies/Results: Ct Head Wo Contrast  11/07/2012  *RADIOLOGY REPORT*  Clinical Data: Code stroke with aphasia and drooling.  CT HEAD WITHOUT CONTRAST  Technique:  Contiguous axial images were obtained from the base of the skull through the vertex without contrast.  Comparison: None.  Findings: The patient had difficulty remaining motionless for the study.  Images are suboptimal.  Small or subtle lesions could be overlooked. There is no evidence for acute infarction, intracranial hemorrhage, mass lesion, hydrocephalus, or extra-axial fluid.  Mild to moderate atrophy.  Chronic microvascular ischemic change.  Small dystrophic calcification left sylvian fissure, a chronic insult. Calvarium intact. Advanced proximal vascular calcification.  The maxillary sinuses show chronic mucosal thickening with other sinuses grossly clear.  No definite mastoid fluid.  IMPRESSION: Motion  degraded exam showing no definite acute infarct or hemorrhage.  Critical Value/emergent results were called by telephone at the time of interpretation on 11/08/2011  at 6:54 p.m. to Dr. Preston Fleeting, who verbally acknowledged these results.   Original Report Authenticated By: Davonna Belling, M.D.    Mr Angiogram Head Wo Contrast  11/07/2012  *RADIOLOGY REPORT*  Clinical Data:  Mental status changes.  Speech disturbance.  MRI HEAD WITHOUT CONTRAST MRA HEAD WITHOUT CONTRAST  Technique:  Multiplanar, multiecho pulse sequences of the brain and surrounding structures were obtained without intravenous contrast. Angiographic images of the head were obtained using MRA technique without contrast.  Comparison:  Head CT same day  MRI HEAD  Findings:  There are innumerable punctate foci of restricted diffusion scattered throughout the the anterior circulation territory bilaterally, more numerous on the left than the right. No single infarction is larger than 5 mm in size.  The pattern is suggestive of a shower of micro emboli.  No large vessel territory infarction.  No sign of mass lesion, hemorrhage, hydrocephalus or extra-axial collection.  There is a background pattern of chronic small vessel disease throughout the brain.  No pituitary mass. There is mucosal inflammation affecting the paranasal sinuses.  No skull or skull base lesion.  IMPRESSION: Innumerable punctate foci of acute infarctions scattered throughout the cerebral hemispheres consistent with micro emboli from a cardiac or ascending aortic source. However, based on a congenital pattern described below, I think it is more likely that this pattern derives from  the left carotid system micro emboli.  Background pattern of chronic small vessel disease.  Mucosal inflammation of the paranasal sinuses.  MRA HEAD  Findings: Both internal carotid arteries are widely patent into the brain.  No siphon stenosis.  The anterior and middle cerebral vessels are patent without proximal  stenosis, aneurysm or vascular malformation.  More distal branch vessels show mild atherosclerotic change. The A1 segment on the right is diminutive.  Both anterior cerebral arteries receive most there supply from the left carotid circulation. There is dominant contribution to the posterior cerebral arteries from the anterior circulation.  Both vertebral arteries are patent with the left being dominant. The right vertebral artery is very small, either congenital or due to atherosclerosis.  No basilar stenosis.  Posterior circulation branch vessels are patent, with the posterior cerebral arteries receiving most of there supply from the anterior circulation.  IMPRESSION:  No major vessel occlusion or correctable proximal stenosis.  Small distal right vertebral artery which could be congenital or due to required atherosclerosis.  More distal branch vessels show atherosclerotic change diffusely.  Diminutive A1 segment on the right.  Prominent anterior circulation contribution to the posterior cerebral arteries.  Based on these variations, the embolic pattern is more likely to be due to left carotid disease than cardiac or ascending aortic disease.   Original Report Authenticated By: Paulina Fusi, M.D.    Mr Brain Wo Contrast  11/07/2012  *RADIOLOGY REPORT*  Clinical Data:  Mental status changes.  Speech disturbance.  MRI HEAD WITHOUT CONTRAST MRA HEAD WITHOUT CONTRAST  Technique:  Multiplanar, multiecho pulse sequences of the brain and surrounding structures were obtained without intravenous contrast. Angiographic images of the head were obtained using MRA technique without contrast.  Comparison:  Head CT same day  MRI HEAD  Findings:  There are innumerable punctate foci of restricted diffusion scattered throughout the the anterior circulation territory bilaterally, more numerous on the left than the right. No single infarction is larger than 5 mm in size.  The pattern is suggestive of a shower of micro emboli.  No  large vessel territory infarction.  No sign of mass lesion, hemorrhage, hydrocephalus or extra-axial collection.  There is a background pattern of chronic small vessel disease throughout the brain.  No pituitary mass. There is mucosal inflammation affecting the paranasal sinuses.  No skull or skull base lesion.  IMPRESSION: Innumerable punctate foci of acute infarctions scattered throughout the cerebral hemispheres consistent with micro emboli from a cardiac or ascending aortic source. However, based on a congenital pattern described below, I think it is more likely that this pattern derives from the left carotid system micro emboli.  Background pattern of chronic small vessel disease.  Mucosal inflammation of the paranasal sinuses.  MRA HEAD  Findings: Both internal carotid arteries are widely patent into the brain.  No siphon stenosis.  The anterior and middle cerebral vessels are patent without proximal stenosis, aneurysm or vascular malformation.  More distal branch vessels show mild atherosclerotic change. The A1 segment on the right is diminutive.  Both anterior cerebral arteries receive most there supply from the left carotid circulation. There is dominant contribution to the posterior cerebral arteries from the anterior circulation.  Both vertebral arteries are patent with the left being dominant. The right vertebral artery is very small, either congenital or due to atherosclerosis.  No basilar stenosis.  Posterior circulation branch vessels are patent, with the posterior cerebral arteries receiving most of there supply from the anterior circulation.  IMPRESSION:  No major vessel occlusion or correctable proximal stenosis.  Small distal right vertebral artery which could be congenital or due to required atherosclerosis.  More distal branch vessels show atherosclerotic change diffusely.  Diminutive A1 segment on the right.  Prominent anterior circulation contribution to the posterior cerebral arteries.  Based  on these variations, the embolic pattern is more likely to be due to left carotid disease than cardiac or ascending aortic disease.   Original Report Authenticated By: Paulina Fusi, M.D.     Medications: I have reviewed the patient's current medications.  Assessment/Plan: Acute CVA- with expressive aphasia and dysarthria, confusion- improved MRI showed punctate acute area in left  Cerebral area emboli ? From left carotid Check  Carotid u/s. Echo today-  Cholesterol is ok, not DM. No HTN ASA 325 mg daily Asthma- continue Pulmocort and serenvent inhalers- stable H/o BPPV- stable If study ok , may d/c probable in am    LOS: 1 day   Kelli Robeck 11/08/2012, 7:50 AM

## 2012-11-09 DIAGNOSIS — J45909 Unspecified asthma, uncomplicated: Secondary | ICD-10-CM

## 2012-11-09 DIAGNOSIS — I6350 Cerebral infarction due to unspecified occlusion or stenosis of unspecified cerebral artery: Secondary | ICD-10-CM | POA: Diagnosis not present

## 2012-11-09 DIAGNOSIS — R4701 Aphasia: Secondary | ICD-10-CM

## 2012-11-09 MED ORDER — ASPIRIN 325 MG PO TABS: 325.0000 mg | ORAL_TABLET | Freq: Every day | ORAL | Status: DC

## 2012-11-09 NOTE — Progress Notes (Signed)
Stroke Team Progress Note  HISTORY Craig Vazquez is a 79 y.o. male who was last seen well at 1650 on 11/07/2012 by his wife. She left him alone for 10 minutes, upon return she found him to have garbled speech with some drooling. No extremity weakness, confusion or fall. He had been working outside that day for a couple of hours in the yard. He does not take aspirin. Patient has a history of asthma and BPPV. See's Dr. Eula Listen. Patient was not a TPA candidate secondary to resolving symptoms. He was admitted  for further evaluation and treatment.  SUBJECTIVE  There no family members present this morning. The patient is without complaints. He feels he is back to baseline.  OBJECTIVE Most recent Vital Signs: Filed Vitals:   11/08/12 1853 11/08/12 2138 11/09/12 0208 11/09/12 0545  BP: 120/61 146/62 124/59 128/63  Pulse: 69 64 75 71  Temp: 97.6 F (36.4 C) 98.1 F (36.7 C) 98.1 F (36.7 C) 98.2 F (36.8 C)  TempSrc: Oral Oral Oral Oral  Resp: 16 18 18 19   Height:      Weight:    67.4 kg (148 lb 9.4 oz)  SpO2: 97% 98% 98% 96%   CBG (last 3)   Recent Labs  11/07/12 1848  GLUCAP 113*    IV Fluid Intake:     MEDICATIONS  . aspirin  325 mg Oral Daily  . fluticasone  2 spray Each Nare Daily  . fluticasone  2 puff Inhalation BID  . heparin  5,000 Units Subcutaneous Q8H  . salmeterol  1 puff Inhalation BID   PRN:    Diet:  Cardiac thin liquids Activity:  Up with assistance. DVT Prophylaxis:  Heparin 5000 units sq tid   CLINICALLY SIGNIFICANT STUDIES Basic Metabolic Panel:   Recent Labs Lab 11/07/12 1817 11/07/12 1832  NA 139 139  K 3.9 3.8  CL 103 104  CO2 24  --   GLUCOSE 121* 119*  BUN 12 11  CREATININE 1.22 1.20  CALCIUM 9.5  --    Liver Function Tests:   Recent Labs Lab 11/07/12 1817  AST 23  ALT 12  ALKPHOS 51  BILITOT 0.4  PROT 7.2  ALBUMIN 4.3   CBC:   Recent Labs Lab 11/07/12 1817 11/07/12 1832  WBC 9.4  --   NEUTROABS 6.8  --   HGB 13.9 14.3   HCT 39.1 42.0  MCV 90.1  --   PLT 217  --    Coagulation:   Recent Labs Lab 11/07/12 1817  LABPROT 12.7  INR 0.96   Cardiac Enzymes:   Recent Labs Lab 11/07/12 1817  TROPONINI <0.30   Urinalysis: No results found for this basename: COLORURINE, APPERANCEUR, LABSPEC, PHURINE, GLUCOSEU, HGBUR, BILIRUBINUR, KETONESUR, PROTEINUR, UROBILINOGEN, NITRITE, LEUKOCYTESUR,  in the last 168 hours Lipid Panel    Component Value Date/Time   CHOL 183 11/08/2012 0530   TRIG 72 11/08/2012 0530   HDL 86 11/08/2012 0530   CHOLHDL 2.1 11/08/2012 0530   VLDL 14 11/08/2012 0530   LDLCALC 83 11/08/2012 0530   HgbA1C  Lab Results  Component Value Date   HGBA1C 6.0* 11/08/2012    Urine Drug Screen:   No results found for this basename: labopia,  cocainscrnur,  labbenz,  amphetmu,  thcu,  labbarb    Alcohol Level: No results found for this basename: ETH,  in the last 168 hours  CT of the brain  11/07/2012  Motion degraded exam showing no definite acute infarct  or hemorrhage.   MRI of the brain  11/07/2012   Innumerable punctate foci of acute infarctions scattered throughout the cerebral hemispheres consistent with micro emboli from a cardiac or ascending aortic source. However, based on a congenital pattern described below, I think it is more likely that this pattern derives from the left carotid system micro emboli.  Background pattern of chronic small vessel disease.  Mucosal inflammation of the paranasal sinuses.    MRA of the brain  11/07/2012   No major vessel occlusion or correctable proximal stenosis.  Small distal right vertebral artery which could be congenital or due to required atherosclerosis.  More distal branch vessels show atherosclerotic change diffusely.  Diminutive A1 segment on the right.  Prominent anterior circulation contribution to the posterior cerebral arteries.  Based on these variations, the embolic pattern is more likely to be due to left carotid disease than cardiac or ascending aortic  disease.   2D Echocardiogram  pending  Carotid Doppler  There is no obvious evidence of hemodynamically significant internal carotid artery stenosis >40%. Vertebral arteries are patent with antegrade flow.  CXR    EKG  sinus tachycardia.   Therapy Recommendations no needs  Physical Exam   Pleasant elderly Caucasian male currently not in distress.Awake alert. Afebrile. Head is nontraumatic. Neck is supple without bruit. Hearing is normal. Cardiac exam no murmur or gallop. Lungs are clear to auscultation. Distal pulses are well felt. Neurological Exam :  Awake  Alert oriented x 3. Normal speech and language.eye movements full without nystagmus.fundi were not visualized. Vision acuity and fields appear normal. Hearing is normal. Palatal movements are normal. Face symmetric. Tongue midline. Normal strength, tone, reflexes and coordination. Normal sensation. Gait deferred.  ASSESSMENT [name] is a 79 y.o. male presenting with sudden onset inability to speak (but could type a text). Imaging confirms a multiple bilateral tiny infarcts. Infarct felt to be embolic secondary to unknown source.  On no antiplatlets prior to admission. Now on aspirin 325 mg orally every day for secondary stroke prevention. Patient with resultant improved dysarthria. Work up underway.  LDL 83 HgbA1c 6.0  Hospital day # 2  TREATMENT/PLAN  Continue aspirin 325 mg orally every day for secondary stroke prevention.  Continue telemetry monitoring to look for atrial fibrillation TEE to look for embolic source. Please arrange with pts cardiologist or cardiologist of choice. If positive for PFO (patent foramen ovale), check bilateral lower extremity venous dopplers to rule out DVT as possible source of stroke. Can be done as an OP next week if discharge desired (TEEs are not typically performed on the weekends.) Please schedule outpatient telemetry monitoring to assess patient for atrial fibrillation as source of  stroke. May be arranged with patient's cardiologist, or cardiologist of choice.  Remote history of GI bleed F/u 2D echo. Followup Dr. Pearlean Brownie in 1-2 months.   Delton See PA-C [facility] Pager [phone] 11/09/2012, 9:46 AM  This patient was discharged prior to my evaluation.  Lesly Dukes

## 2012-11-09 NOTE — Discharge Summary (Signed)
Physician Discharge Summary  Patient ID: Craig Vazquez MRN: [id number] DOB/AGE: 1930/08/04 79 y.o.  Admit date: 11/07/2012 Discharge date: 11/09/2012  Admission Diagnoses: Stroke  Discharge Diagnoses:  Principal Problem:   Expressive aphasia Active Problems:   Asthma, chronic   BPPV (benign paroxysmal positional vertigo)   Discharged Condition: good  Hospital Course: on the day of admission the patient had been working in the yard at about 5 PM he came in for a glass of water. While eating the water out of the refrigerator he had a sudden sense of weakness and collapsed to the floor. He states that he laid there for approximately 10 minutes before he was able to get his wife's attention. At that time he was unable to speak only able to produce garbled sounds. EMS arrived approximately 5:30 and within an hour his symptoms have significantly improved. During the hospitalization the MRI brain revealed multiple strokes likely of an embolic source. The carotid Dopplers were negative for significant stenosis. He remained in normal sinus rhythm. He was started on one aspirin a day and suggested that he should get a TEE as an outpatient   Consults: neurology  Significant Diagnostic Studies: labs: carotid Dopplers negative for significant stenosis  Treatments: aspirin  Discharge Exam: Blood pressure 128/63, pulse 71, temperature 98.2 F (36.8 C), temperature source Oral, resp. rate 19, height 5\' 10"  (1.778 m), weight 67.4 kg (148 lb 9.4 oz), SpO2 96.00%. Neurologic: Grossly normal  Disposition: Final discharge disposition not confirmed     Medication List    ASK your doctor about these medications       fluticasone 50 MCG/ACT nasal spray  Commonly known as:  FLONASE  Place 2 sprays into the nose daily.     PULMICORT FLEXHALER 90 MCG/ACT inhaler  Generic drug:  Budesonide  Inhale 1 puff into the lungs 2 (two) times daily.     salmeterol 50 MCG/DOSE diskus inhaler  Commonly known  as:  SEREVENT  Inhale 1 puff into the lungs 2 (two) times daily.           Follow-up Information   Follow up with [name], MD In 9 days.   Contact information:   [address] [place] [address] [phone]       Signed: MITCHELL, IWANICKI 11/09/2012, 9:34 AM

## 2012-11-09 NOTE — Discharge Instructions (Signed)
STROKE/TIA DISCHARGE INSTRUCTIONS SMOKING Cigarette smoking nearly doubles your risk of having a stroke & is the single most alterable risk factor  If you smoke or have smoked in the last 12 months, you are advised to quit smoking for your health.  Most of the excess cardiovascular risk related to smoking disappears within a year of stopping.  Ask you doctor about anti-smoking medications  [place] Quit Line: 1-800-QUIT NOW  Free Smoking Cessation Classes [phone]  CHOLESTEROL Know your levels; limit fat & cholesterol in your diet  Lipid Panel     Component Value Date/Time   CHOL 183 11/08/2012 0530   TRIG 72 11/08/2012 0530   HDL 86 11/08/2012 0530   CHOLHDL 2.1 11/08/2012 0530   VLDL 14 11/08/2012 0530   LDLCALC 83 11/08/2012 0530      Many patients benefit from treatment even if their cholesterol is at goal.  Goal: Total Cholesterol (CHOL) less than 160  Goal:  Triglycerides (TRIG) less than 150  Goal:  HDL greater than 40  Goal:  LDL (LDLCALC) less than 100   BLOOD PRESSURE American Stroke Association blood pressure target is less that 120/80 mm/Hg  Your discharge blood pressure is:  BP: 120/54 mmHg  Monitor your blood pressure  Limit your salt and alcohol intake  Many individuals will require more than one medication for high blood pressure  DIABETES (A1c is a blood sugar average for last 3 months) Goal HGBA1c is under 7% (HBGA1c is blood sugar average for last 3 months)  Diabetes: {STROKE DC DIABETES:22357}    No results found for this basename: HGBA1C     Your HGBA1c can be lowered with medications, healthy diet, and exercise.  Check your blood sugar as directed by your physician  Call your physician if you experience unexplained or low blood sugars.  PHYSICAL ACTIVITY/REHABILITATION Goal is 30 minutes at least 4 days per week    {STROKE DC ACTIVITY/REHAB:22359}  Activity decreases your risk of heart attack and stroke and makes your heart stronger.  It helps control  your weight and blood pressure; helps you relax and can improve your mood.  Participate in a regular exercise program.  Talk with your doctor about the best form of exercise for you (dancing, walking, swimming, cycling).  DIET/WEIGHT Goal is to maintain a healthy weight  Your discharge diet is: Cardiac *** liquids Your height is:  Height: 5\' 10"  (177.8 cm) Your current weight is: Weight: 72.8 kg (160 lb 7.9 oz) Your Body Mass Index (BMI) is:  BMI (Calculated): 23.1  Following the type of diet specifically designed for you will help prevent another stroke.  Your goal weight range is:  ***  Your goal Body Mass Index (BMI) is 19-24.  Healthy food habits can help reduce 3 risk factors for stroke:  High cholesterol, hypertension, and excess weight.  RESOURCES Stroke/Support Group:  Call [phone]  they meet the 3rd Sunday of the month on the Rehab Unit at [facility], [place] ( no meetings June, July & Aug).  STROKE EDUCATION PROVIDED/REVIEWED AND GIVEN TO PATIENT Stroke warning signs and symptoms How to activate emergency medical system (call 911). Medications prescribed at discharge. Need for follow-up after discharge. Personal risk factors for stroke. Pneumonia vaccine given:   {STROKE DC YES/NO/DATE:22363} Flu vaccine given:   {STROKE DC YES/NO/DATE:22363} My questions have been answered, the writing is legible, and I understand these instructions.  I will adhere to these goals & educational materials that have been provided to me after  my discharge from the hospital.   Resume regular activity avoid overheating

## 2012-11-22 ENCOUNTER — Ambulatory Visit: Payer: Medicare Other | Attending: Internal Medicine | Admitting: Physical Therapy

## 2012-11-22 DIAGNOSIS — IMO0001 Reserved for inherently not codable concepts without codable children: Secondary | ICD-10-CM | POA: Insufficient documentation

## 2012-11-22 DIAGNOSIS — R269 Unspecified abnormalities of gait and mobility: Secondary | ICD-10-CM | POA: Diagnosis not present

## 2012-11-26 ENCOUNTER — Ambulatory Visit: Payer: Medicare Other | Admitting: Physical Therapy

## 2012-11-29 ENCOUNTER — Ambulatory Visit: Payer: Medicare Other | Admitting: *Deleted

## 2012-12-03 ENCOUNTER — Ambulatory Visit: Payer: Medicare Other | Admitting: Physical Therapy

## 2012-12-06 ENCOUNTER — Ambulatory Visit: Payer: Medicare Other | Admitting: *Deleted

## 2012-12-08 DIAGNOSIS — M6281 Muscle weakness (generalized): Secondary | ICD-10-CM | POA: Diagnosis not present

## 2012-12-08 DIAGNOSIS — I679 Cerebrovascular disease, unspecified: Secondary | ICD-10-CM | POA: Diagnosis not present

## 2012-12-12 ENCOUNTER — Encounter: Payer: Self-pay | Admitting: Neurology

## 2012-12-12 ENCOUNTER — Ambulatory Visit (INDEPENDENT_AMBULATORY_CARE_PROVIDER_SITE_OTHER): Payer: Medicare Other | Admitting: Neurology

## 2012-12-12 VITALS — BP 116/58 | HR 68 | Ht 69.0 in | Wt 169.0 lb

## 2012-12-12 DIAGNOSIS — I6350 Cerebral infarction due to unspecified occlusion or stenosis of unspecified cerebral artery: Secondary | ICD-10-CM

## 2012-12-12 DIAGNOSIS — I639 Cerebral infarction, unspecified: Secondary | ICD-10-CM | POA: Insufficient documentation

## 2012-12-12 NOTE — Progress Notes (Signed)
[facility]  PATIENT: Craig Vazquez DOB: [birth year]   HISTORY FROM: patient, chart REASON FOR VISIT: routine stroke follow up  HISTORY OF PRESENT ILLNESS:  Patient denies medication side effects, with no signs of bleeding.    REVIEW OF SYSTEMS: Full 14 system review of systems performed and notable only for: allergy/immunology: Allergies   ALLERGIES: No Known Allergies  HOME MEDICATIONS: Outpatient Prescriptions Prior to Visit  Medication Sig Dispense Refill  . aspirin 325 MG tablet Take 1 tablet (325 mg total) by mouth daily.  1 tablet  1  . Budesonide (PULMICORT FLEXHALER) 90 MCG/ACT inhaler Inhale 1 puff into the lungs 2 (two) times daily.      . fluticasone (FLONASE) 50 MCG/ACT nasal spray Place 2 sprays into the nose daily.      . salmeterol (SEREVENT) 50 MCG/DOSE diskus inhaler Inhale 1 puff into the lungs 2 (two) times daily.       No facility-administered medications prior to visit.    PAST MEDICAL HISTORY: Past Medical History  Diagnosis Date  . Asthma   . Vertigo     PAST SURGICAL HISTORY: Past Surgical History  Procedure Laterality Date  . Hand surgery      rt hand  . Laparoscopic gastrotomy w/ repair of ulcer  1970  . Herina      FAMILY HISTORY: Family History  Problem Relation Age of Onset  . Cancer Brother     lung ca x 2 brothers    SOCIAL HISTORY: History   Social History  . Marital Status: Married    Spouse Name: N/A    Number of Children: N/A  . Years of Education: N/A   Occupational History  . Not on file.   Social History Main Topics  . Smoking status: Former Games developer  . Smokeless tobacco: Not on file     Comment: quit 1970  . Alcohol Use: Yes  . Drug Use: No  . Sexually Active: Not on file   Other Topics Concern  . Not on file   Social History Narrative  . No narrative on file     PHYSICAL EXAM  Filed Vitals:   12/12/12 1441  BP: 116/58  Pulse: 68  Height: 5\' 9"  (1.753 m)  Weight: 169 lb  (76.658 kg)   Body mass index is 24.95 kg/(m^2).  GENERAL EXAM: Patient is in no distress, well developed and well groomed. HEAD: Symmetric facial features. EARS, NOSE, and THROAT: Normal.  NECK: Supple, no JVD RESPIRATORY: Lungs CTA. CARDIOVASCULAR: Regular rate and rhythm, no murmurs, no carotid bruits SKIN: No rash, no bruising  NEUROLOGIC: MENTAL STATUS: awake, alert and oriented to person, place and time, language fluent, comprehension intact, naming intact CRANIAL NERVE: pupils equal and reactive to light, visual fields full to confrontation, extraocular muscles intact, no nystagmus, facial sensation and strength symmetric, uvula midline, shoulder shrug symmetric, tongue midline. MOTOR: normal bulk and tone, full strength in the BUE, BLE, fine finger movements normal SENSORY: normal and symmetric to light touch, pinprick, temperature, vibration COORDINATION: finger-nose-finger normal REFLEXES: deep tendon reflexes present and symmetric 2+,  GAIT/STATION: narrow based gait; able to walk on toes, heels and tandem; romberg is negative. No assistive device.   DIAGNOSTIC DATA (LABS, IMAGING, TESTING) - I reviewed patient records, labs, notes, testing and imaging myself where available.  Lab Results  Component Value Date   WBC 9.4 11/07/2012   HGB 14.3 11/07/2012   HCT 42.0 11/07/2012   MCV 90.1 11/07/2012   PLT  217 11/07/2012      Component Value Date/Time   NA 139 11/07/2012 1832   K 3.8 11/07/2012 1832   CL 104 11/07/2012 1832   CO2 24 11/07/2012 1817   GLUCOSE 119* 11/07/2012 1832   BUN 11 11/07/2012 1832   CREATININE 1.20 11/07/2012 1832   CALCIUM 9.5 11/07/2012 1817   PROT 7.2 11/07/2012 1817   ALBUMIN 4.3 11/07/2012 1817   AST 23 11/07/2012 1817   ALT 12 11/07/2012 1817   ALKPHOS 51 11/07/2012 1817   BILITOT 0.4 11/07/2012 1817   GFRNONAA 55* 11/07/2012 1817   GFRAA 63* 11/07/2012 1817   Lab Results  Component Value Date   CHOL 183 11/08/2012   HDL 86 11/08/2012   LDLCALC 83 11/08/2012   TRIG 72  11/08/2012   CHOLHDL 2.1 11/08/2012   Lab Results  Component Value Date   HGBA1C 6.0* 11/08/2012   No results found for this basename: VITAMINB12   No results found for this basename: TSH     ASSESSMENT AND PLAN  [name] is a 79 year old Caucasian gentleman who had a stroke on 11/07/12.  MRI revealed multiple small areas of stroke, likely of unknown embolic source.  Carotid dopplers were negative for significant stenosis. Symptoms of aphasia quickly resolved in hospital.    Continue aspirin 325 mg orally every day  for secondary stroke prevention and maintain strict control of hypertension with blood pressure goal below 130/90, diabetes with hemoglobin A1c goal below 6.5% and lipids with LDL cholesterol goal below 100 mg/dL.  We have referred patient to [facility] for TEE and LifeWatch telemetry monitoring for 30 days. Lipid panel to be checked every 6 months. Followup in 3 months.   LYNN LAM NP-C 12/12/2012, 4:02 PM  [facility] [address] [place], [place] [address] [phone]  I have personally examined this patient, reviewed pertinent data, developed plan of care and discussed with patient and agree with above.  Delia Heady, MD

## 2012-12-12 NOTE — Patient Instructions (Addendum)
We will order TEE and LifeWatch Cardiac monitor through [facility].  Follow up in office in 3-6 months.  STROKE/TIA INSTRUCTIONS SMOKING Cigarette smoking nearly doubles your risk of having a stroke & is the single most alterable risk factor  If you smoke or have smoked in the last 12 months, you are advised to quit smoking for your health.  Most of the excess cardiovascular risk related to smoking disappears within a year of stopping.  Ask you doctor about anti-smoking medications  [place] Quit Line: 1-800-QUIT NOW  Free Smoking Cessation Classes ([phone]  CHOLESTEROL Know your levels; limit fat & cholesterol in your diet  Lab Results  Component Value Date   CHOL 183 11/08/2012   HDL 86 11/08/2012   LDLCALC 83 11/08/2012   TRIG 72 11/08/2012   CHOLHDL 2.1 11/08/2012      Many patients benefit from treatment even if their cholesterol is at goal.  Goal: Total Cholesterol less than 160  Goal:  LDL less than 100  Goal:  HDL greater than 40  Goal:  Triglycerides less than 150  BLOOD PRESSURE American Stroke Association blood pressure target is less that 120/80 mm/Hg  Your discharge blood pressure is:  BP: 116/58 mmHg  Monitor your blood pressure  Limit your salt and alcohol intake  Many individuals will require more than one medication for high blood pressure  DIABETES (A1c is a blood sugar average for last 3 months) Goal A1c is under 7% (A1c is blood sugar average for last 3 months)  Diabetes: No known diagnosis of diabetes    Lab Results  Component Value Date   HGBA1C 6.0* 11/08/2012    Your A1c can be lowered with medications, healthy diet, and exercise.  Check your blood sugar as directed by your physician  Call your physician if you experience unexplained or low blood sugars.  PHYSICAL ACTIVITY/REHABILITATION Goal is 30 minutes at least 4 days per week    Activity decreases your risk of heart attack and stroke and makes your heart stronger.  It helps control your  weight and blood pressure; helps you relax and can improve your mood.  Participate in a regular exercise program.  Talk with your doctor about the best form of exercise for you (dancing, walking, swimming, cycling).  DIET/WEIGHT Goal is to maintain a healthy weight  Your height is:  Height: 5\' 9"  (175.3 cm) Your current weight is: Weight: 169 lb (76.658 kg) Your body Mass Index (BMI) is:  BMI (Calculated): 25  Following the type of diet specifically designed for you will help prevent another stroke.  Your goal Body Mass Index (BMI) is 19-24.  Healthy food habits can help reduce 3 risk factors for stroke:  High cholesterol, hypertension, and excess weight.

## 2012-12-18 ENCOUNTER — Telehealth: Payer: Self-pay | Admitting: *Deleted

## 2012-12-18 ENCOUNTER — Other Ambulatory Visit: Payer: Self-pay | Admitting: *Deleted

## 2012-12-18 DIAGNOSIS — I4891 Unspecified atrial fibrillation: Secondary | ICD-10-CM

## 2012-12-18 DIAGNOSIS — I6350 Cerebral infarction due to unspecified occlusion or stenosis of unspecified cerebral artery: Secondary | ICD-10-CM

## 2012-12-18 NOTE — Telephone Encounter (Signed)
Debbie from [facility] called on behalf of Dr. Pearlean Brownie to arrange TEE to evaluate for [facility], diagnostic code 434.91. Please notify Eunice Blase of scheduled date, as well as, patient.

## 2012-12-19 ENCOUNTER — Telehealth: Payer: Self-pay | Admitting: *Deleted

## 2012-12-19 NOTE — Telephone Encounter (Signed)
Dr. Delia Heady MD, from [facility] did  order the TEE. According to our Manager Ashok Croon RN,  you will need to put the orders in. Thanks Nivida.

## 2012-12-19 NOTE — Telephone Encounter (Signed)
Pt is scheduled for a TEE on July 2 Nd at 11:00 AM with Dr. Marca Ancona;  pt is to arrive at the Endoscopy lab at 9:30 AM Via the new North tower main entrance "A" located at [address]  Pt need to have labs drown: BMET, CBC within 6 weeks  Of the procedure. Pt is to be NPO after MN and have someone to drive;  Pt aware of instructions.This message is being send to Alease Frame RN; Dr. Marlis Edelson nurse.

## 2012-12-19 NOTE — Telephone Encounter (Signed)
Message copied by Harriet Butte on Thu Dec 19, 2012  3:24 PM ------      Message from: Laurey Morale      Created: Thu Dec 19, 2012 11:17 AM      Regarding: RE: orders       Can whoever ordered the tee do orders?       ----- Message -----         From: Harriet Butte, RN         Sent: 12/19/2012  10:58 AM           To: Laurey Morale, MD      Subject: orders                                                   Pt is on your scheduled for a TEE on 01/08/13 at 11:00 AM. Pt.  needs orders.          Thanks Ollen Gross RN        ------

## 2012-12-19 NOTE — Telephone Encounter (Signed)
Order for BMET and CBC placed.

## 2012-12-23 ENCOUNTER — Telehealth: Payer: Self-pay | Admitting: *Deleted

## 2012-12-23 ENCOUNTER — Encounter: Payer: Self-pay | Admitting: *Deleted

## 2012-12-23 ENCOUNTER — Encounter (INDEPENDENT_AMBULATORY_CARE_PROVIDER_SITE_OTHER): Payer: Medicare Other

## 2012-12-23 DIAGNOSIS — I4891 Unspecified atrial fibrillation: Secondary | ICD-10-CM | POA: Diagnosis not present

## 2012-12-23 DIAGNOSIS — I6350 Cerebral infarction due to unspecified occlusion or stenosis of unspecified cerebral artery: Secondary | ICD-10-CM

## 2012-12-23 NOTE — Telephone Encounter (Signed)
I spoke with pt to let him know about getting labs done prior to his TEE scheduled at [facility] 01-08-13.   He stated that he was to be here in [place], to have his monitor placed today and could come by.   He then asked about rescheduling the TEE.   I relayed to let them know at [facility] about this when in for moniotr placement.  If needs my help to let me know.  He verbalized understanding.

## 2012-12-23 NOTE — Progress Notes (Signed)
Patient ID: Craig Vazquez, male   DOB: April 30, 1931, 79 y.o.   MRN: [id number] Lifewatch 30 day telemetry monitor applied to patient.  Lifewatch transmitter unable to pick up cell signal.  [employer] to call patient within 24 hours and instruct patient on recording symptoms.  [name] unhappy with event monitor stating it was much more complicated than he thought.  It was explained Lifewatch was the monitor type that was specified in his doctors order and that all monitors will involve changing electrodes, charging units or changing batteries.  It was also explained to [name] that if he is still unhappy with the monitor after speaking with [employer] , we could try a different type of monitor if ordered by Dr. Pearlean Brownie.

## 2012-12-24 DIAGNOSIS — I4891 Unspecified atrial fibrillation: Secondary | ICD-10-CM | POA: Diagnosis not present

## 2012-12-26 ENCOUNTER — Ambulatory Visit (INDEPENDENT_AMBULATORY_CARE_PROVIDER_SITE_OTHER): Payer: Medicare Other

## 2012-12-26 DIAGNOSIS — I639 Cerebral infarction, unspecified: Secondary | ICD-10-CM

## 2012-12-26 DIAGNOSIS — I6350 Cerebral infarction due to unspecified occlusion or stenosis of unspecified cerebral artery: Secondary | ICD-10-CM

## 2012-12-31 ENCOUNTER — Other Ambulatory Visit: Payer: Medicare Other

## 2013-01-03 ENCOUNTER — Ambulatory Visit (INDEPENDENT_AMBULATORY_CARE_PROVIDER_SITE_OTHER): Payer: Medicare Other | Admitting: *Deleted

## 2013-01-03 ENCOUNTER — Encounter: Payer: Self-pay | Admitting: Nurse Practitioner

## 2013-01-03 ENCOUNTER — Telehealth: Payer: Self-pay | Admitting: Nurse Practitioner

## 2013-01-03 DIAGNOSIS — I6350 Cerebral infarction due to unspecified occlusion or stenosis of unspecified cerebral artery: Secondary | ICD-10-CM

## 2013-01-03 LAB — CBC WITH DIFFERENTIAL/PLATELET
Basophils Absolute: 0 10*3/uL (ref 0.0–0.1)
Basophils Relative: 0.5 % (ref 0.0–3.0)
Eosinophils Absolute: 0.4 10*3/uL (ref 0.0–0.7)
Eosinophils Relative: 6.6 % — ABNORMAL HIGH (ref 0.0–5.0)
HCT: 39.1 % (ref 39.0–52.0)
Hemoglobin: 13.2 g/dL (ref 13.0–17.0)
Lymphocytes Relative: 31.3 % (ref 12.0–46.0)
Lymphs Abs: 1.9 10*3/uL (ref 0.7–4.0)
MCHC: 33.8 g/dL (ref 30.0–36.0)
MCV: 96.1 fl (ref 78.0–100.0)
Monocytes Absolute: 0.4 10*3/uL (ref 0.1–1.0)
Monocytes Relative: 6.4 % (ref 3.0–12.0)
Neutro Abs: 3.4 10*3/uL (ref 1.4–7.7)
Neutrophils Relative %: 55.2 % (ref 43.0–77.0)
Platelets: 219 10*3/uL (ref 150.0–400.0)
RBC: 4.07 Mil/uL — ABNORMAL LOW (ref 4.22–5.81)
RDW: 12.3 % (ref 11.5–14.6)
WBC: 6.1 10*3/uL (ref 4.5–10.5)

## 2013-01-03 LAB — BASIC METABOLIC PANEL
BUN: 9 mg/dL (ref 6–23)
CO2: 30 mEq/L (ref 19–32)
Calcium: 9 mg/dL (ref 8.4–10.5)
Chloride: 101 mEq/L (ref 96–112)
Creatinine, Ser: 1.1 mg/dL (ref 0.4–1.5)
GFR: 72.28 mL/min (ref >60.00)
Glucose, Bld: 108 mg/dL — ABNORMAL HIGH (ref 70–99)
Potassium: 4.4 mEq/L (ref 3.5–5.1)
Sodium: 135 mEq/L (ref 135–145)

## 2013-01-03 NOTE — Telephone Encounter (Signed)
Patient came to checkout after getting lab work for upcoming TEE.  Merita Norton called triage and explained situation to me ; that patient states he has not received any instructions regarding preparation for the TEE.  I called OR scheduling at [facility] to verify date/time of procedure and printed patient's instructions.  I verbally reviewed instructions with patient in person; patient verbalized understanding and left our office with a copy of the instructions in his hand.

## 2013-01-16 ENCOUNTER — Encounter (HOSPITAL_COMMUNITY): Payer: Self-pay | Admitting: *Deleted

## 2013-01-16 ENCOUNTER — Encounter (HOSPITAL_COMMUNITY)
Admission: RE | Disposition: A | Discharge status: Home / Self Care | Payer: Self-pay | Source: Ambulatory Visit | Attending: Cardiology

## 2013-01-16 ENCOUNTER — Ambulatory Visit (HOSPITAL_COMMUNITY)
Admission: RE | Admit: 2013-01-16 | Discharge: 2013-01-16 | Disposition: A | Discharge status: Home / Self Care | Payer: Medicare Other | Source: Ambulatory Visit | Attending: Cardiology | Admitting: Cardiology

## 2013-01-16 ENCOUNTER — Telehealth: Payer: Self-pay | Admitting: *Deleted

## 2013-01-16 DIAGNOSIS — IMO0002 Reserved for concepts with insufficient information to code with codable children: Secondary | ICD-10-CM | POA: Insufficient documentation

## 2013-01-16 DIAGNOSIS — I639 Cerebral infarction, unspecified: Secondary | ICD-10-CM

## 2013-01-16 DIAGNOSIS — Z8673 Personal history of transient ischemic attack (TIA), and cerebral infarction without residual deficits: Secondary | ICD-10-CM | POA: Insufficient documentation

## 2013-01-16 DIAGNOSIS — Z87891 Personal history of nicotine dependence: Secondary | ICD-10-CM | POA: Insufficient documentation

## 2013-01-16 DIAGNOSIS — I498 Other specified cardiac arrhythmias: Secondary | ICD-10-CM | POA: Insufficient documentation

## 2013-01-16 DIAGNOSIS — J45909 Unspecified asthma, uncomplicated: Secondary | ICD-10-CM | POA: Insufficient documentation

## 2013-01-16 DIAGNOSIS — E162 Hypoglycemia, unspecified: Secondary | ICD-10-CM | POA: Insufficient documentation

## 2013-01-16 DIAGNOSIS — I6789 Other cerebrovascular disease: Secondary | ICD-10-CM

## 2013-01-16 DIAGNOSIS — Z79899 Other long term (current) drug therapy: Secondary | ICD-10-CM | POA: Diagnosis not present

## 2013-01-16 DIAGNOSIS — Z7982 Long term (current) use of aspirin: Secondary | ICD-10-CM | POA: Diagnosis not present

## 2013-01-16 HISTORY — DX: Hypoglycemia, unspecified: E16.2

## 2013-01-16 HISTORY — PX: TEE WITHOUT CARDIOVERSION: SHX5443

## 2013-01-16 HISTORY — DX: Cerebral infarction, unspecified: I63.9

## 2013-01-16 SURGERY — ECHOCARDIOGRAM, TRANSESOPHAGEAL
Anesthesia: Moderate Sedation

## 2013-01-16 MED ORDER — BUTAMBEN-TETRACAINE-BENZOCAINE 2-2-14 % EX AERO
INHALATION_SPRAY | CUTANEOUS | Status: DC | PRN
  Administered 2013-01-16: 2 via TOPICAL

## 2013-01-16 MED ORDER — FENTANYL CITRATE 0.05 MG/ML IJ SOLN
INTRAMUSCULAR | Status: DC | PRN
  Administered 2013-01-16: 25 ug via INTRAVENOUS
  Administered 2013-01-16 (×2): 12.5 ug via INTRAVENOUS

## 2013-01-16 MED ORDER — FENTANYL CITRATE 0.05 MG/ML IJ SOLN
INTRAMUSCULAR | Status: AC
  Filled 2013-01-16: qty 2

## 2013-01-16 MED ORDER — MIDAZOLAM HCL 10 MG/2ML IJ SOLN
INTRAMUSCULAR | Status: DC | PRN
  Administered 2013-01-16 (×2): 2 mg via INTRAVENOUS
  Administered 2013-01-16: 1 mg via INTRAVENOUS

## 2013-01-16 MED ORDER — MIDAZOLAM HCL 5 MG/ML IJ SOLN
INTRAMUSCULAR | Status: AC
  Filled 2013-01-16: qty 2

## 2013-01-16 MED ORDER — SODIUM CHLORIDE 0.9 % IV SOLN
INTRAVENOUS | Status: DC
  Administered 2013-01-16: 10:00:00 via INTRAVENOUS

## 2013-01-16 NOTE — CV Procedure (Signed)
    Transesophageal Echocardiogram Note  SHAFER SWAMY [id number] [birth year]  Procedure: Transesophageal Echocardiogram Indications: CVA  Procedure Details Consent: Obtained Time Out: Verified patient identification, verified procedure, site/side was marked, verified correct patient position, special equipment/implants available, Radiology Safety Procedures followed,  medications/allergies/relevent history reviewed, required imaging and test results available.  Performed  Medications: Fentanyl: 50 mcg IV Versed: 5 mg IV  Left Ventrical:  Normal LV function.  Mitral Valve: normal MV, no significant MR  Aortic Valve: normal  Tricuspid Valve: not well visualized  Pulmonic Valve: not well visualized  Left Atrium/ Left atrial appendage: normal, no thrombi  Atrial septum: + PFO by bubble study  Aorta: mild calcification   Complications: No apparent complications Patient did tolerate procedure well.   Vesta Mixer, Montez Hageman., MD, [facility] 01/16/2013, 11:37 AM

## 2013-01-16 NOTE — H&P (Signed)
ADMISSION HISTORY AND PHYSICAL   Date: 01/16/2013               Patient Name:  Craig Vazquez MRN: [id number]  DOB: October 24, 1930 Age / Sex: 79 y.o., male        PCP: [name] Primary Cardiologist: Marca Ancona, MD         History of Present Illness: Patient is a 79 y.o. male with a PMHx of CVA , who was admitted to [facility] on 01/16/2013 for TEE as part of the CVA evaluation as requested by Dr  Pearlean Brownie.   He is retired Company secretary.  He had a CVA Nov 07, 2012.  He has no significant defects.    Medications: Outpatient medications: Prescriptions prior to admission  Medication Sig Dispense Refill  . aspirin 325 MG tablet Take 1 tablet (325 mg total) by mouth daily.  1 tablet  1  . Budesonide (PULMICORT FLEXHALER) 90 MCG/ACT inhaler Inhale 1 puff into the lungs 2 (two) times daily.      . fluticasone (FLONASE) 50 MCG/ACT nasal spray Place 2 sprays into the nose daily.      . salmeterol (SEREVENT) 50 MCG/DOSE diskus inhaler Inhale 1 puff into the lungs 2 (two) times daily.      . sucralfate (CARAFATE) 1 G tablet Take 1 g by mouth 2 (two) times daily.        No Known Allergies   Past Medical History  Diagnosis Date  . Asthma   . Vertigo   . Stroke     5.1.14  . Hypoglycemia     Past Surgical History  Procedure Laterality Date  . Hand surgery      rt hand  . Laparoscopic gastrotomy w/ repair of ulcer  1970  . Herina    . Hernia repair      Family History  Problem Relation Age of Onset  . Cancer Brother     lung ca x 2 brothers    Social History:  reports that he has quit smoking. He does not have any smokeless tobacco history on file. He reports that  drinks alcohol. He reports that he does not use illicit drugs.   Review of Systems: Constitutional:  denies fever, chills, diaphoresis, appetite change and fatigue.  HEENT: denies photophobia, eye pain, redness, hearing loss, ear pain, congestion, sore throat, rhinorrhea, sneezing, neck pain, neck stiffness and  tinnitus.  Respiratory: denies SOB, DOE, cough, chest tightness, and wheezing.  Cardiovascular: denies chest pain, palpitations and leg swelling.  Gastrointestinal: denies nausea, vomiting, abdominal pain, diarrhea, constipation, blood in stool.  Genitourinary: denies dysuria, urgency, frequency, hematuria, flank pain and difficulty urinating.  Musculoskeletal: denies  myalgias, back pain, joint swelling, arthralgias and gait problem.   Skin: denies pallor, rash and wound.  Neurological: denies dizziness, seizures, syncope, weakness, light-headedness, numbness and headaches.   Hematological: denies adenopathy, easy bruising, personal or family bleeding history.  Psychiatric/ Behavioral: denies suicidal ideation, mood changes, confusion, nervousness, sleep disturbance and agitation.    Physical Exam: BP 142/71  Pulse 77  Temp(Src) 97.5 F (36.4 C) (Oral)  Resp 22  Ht 5\' 8"  (1.727 m)  Wt 160 lb (72.576 kg)  BMI 24.33 kg/m2  SpO2 95%  General: Vital signs reviewed and noted. Well-developed, well-nourished, in no acute distress; alert, appropriate and cooperative throughout examination.  Head: Normocephalic, atraumatic, sclera anicteric, mucus membranes are moist  Neck: Supple. Negative for carotid bruits. JVD not elevated.  Lungs:  Clear bilaterally to auscultation without  wheezes, rales, or rhonchi. Breathing is unlabored.  Heart: RRR with S1 S2. No murmurs, rubs, or gallops appreciated.  Abdomen:  Soft, non-tender, non-distended with normoactive bowel sounds. No hepatomegaly. No rebound/guarding. No obvious abdominal masses  MSK: Strength and the appear normal for age.  Extremities: No clubbing or cyanosis. No edema.  Distal pedal pulses are 2+ and equal bilaterally.  Neurologic: Alert and oriented X 3. Moves all extremities spontaneously  Psych:  Responds to questions appropriately with a normal affect.    Lab results: Basic Metabolic Panel: No results found for this basename:  NA, K, CL, CO2, GLUCOSE, BUN, CREATININE, CALCIUM, MG, PHOS,  in the last 168 hours  Liver Function Tests: No results found for this basename: AST, ALT, ALKPHOS, BILITOT, PROT, ALBUMIN,  in the last 168 hours No results found for this basename: LIPASE, AMYLASE,  in the last 168 hours  CBC: No results found for this basename: WBC, NEUTROABS, HGB, HCT, MCV, PLT,  in the last 168 hours  Cardiac Enzymes: No results found for this basename: CKTOTAL, CKMB, CKMBINDEX, TROPONINI,  in the last 168 hours  BNP: No components found with this basename: POCBNP,   CBG: No results found for this basename: GLUCAP,  in the last 168 hours  Coagulation Studies: No results found for this basename: LABPROT, INR,  in the last 72 hours   Other results:  EKG 11/08/12:  Sinus tachy   Imaging:  No results found.       Assessment & Plan: 1. CVA:  His symptoms have  Resolved.  Discussed risks, benefits, options.  He and daughter understand and agree to proceed.   DVT PPX -    Alvia Grove., MD, [facility] 01/16/2013, 9:55 AM

## 2013-01-16 NOTE — Telephone Encounter (Signed)
Made call to pt and was not able to LM.  Will call later re: normal carotid doppler.

## 2013-01-16 NOTE — Progress Notes (Signed)
  Echocardiogram Echocardiogram Transesophageal has been performed.  Georgian Co 01/16/2013, 11:45 AM

## 2013-01-17 ENCOUNTER — Encounter (HOSPITAL_COMMUNITY): Payer: Self-pay | Admitting: Cardiovascular Disease

## 2013-01-20 ENCOUNTER — Telehealth: Payer: Self-pay | Admitting: *Deleted

## 2013-01-20 NOTE — Telephone Encounter (Signed)
I called pt and relayed that cartotid doppler negative for significant stenosis.   He stated he had TEE and has small pfo.

## 2013-01-24 NOTE — Telephone Encounter (Signed)
Pt called, later note.

## 2013-02-12 ENCOUNTER — Other Ambulatory Visit: Payer: Self-pay

## 2013-02-17 DIAGNOSIS — G5600 Carpal tunnel syndrome, unspecified upper limb: Secondary | ICD-10-CM | POA: Diagnosis not present

## 2013-03-17 ENCOUNTER — Ambulatory Visit: Payer: Medicare Other | Admitting: Nurse Practitioner

## 2013-03-24 DIAGNOSIS — G5600 Carpal tunnel syndrome, unspecified upper limb: Secondary | ICD-10-CM | POA: Diagnosis not present

## 2013-03-31 ENCOUNTER — Ambulatory Visit: Payer: Medicare Other | Admitting: Nurse Practitioner

## 2013-04-01 ENCOUNTER — Encounter: Payer: Self-pay | Admitting: Nurse Practitioner

## 2013-04-01 ENCOUNTER — Ambulatory Visit (INDEPENDENT_AMBULATORY_CARE_PROVIDER_SITE_OTHER): Payer: Medicare Other | Admitting: Nurse Practitioner

## 2013-04-01 VITALS — BP 150/70 | HR 69 | Ht 69.0 in | Wt 175.0 lb

## 2013-04-01 DIAGNOSIS — I6350 Cerebral infarction due to unspecified occlusion or stenosis of unspecified cerebral artery: Secondary | ICD-10-CM

## 2013-04-01 NOTE — Progress Notes (Signed)
[facility]  PATIENT: Craig Vazquez DOB: Jan 26, 1931  REASON FOR VISIT: follow up HISTORY FROM: patient  HISTORY OF PRESENT ILLNESS: Craig Vazquez is an 79 y.o. male who was LKW 1650 on 11/07/2012 by his wife, she left him alone for 10 minutes, upon return she found him to have garbled speech with some drooling. No extremity weakness, confusion or fall. He has been working outside today for a couple of hours in the yard. Does not take aspirin. Patient has history of asthma and BPPV. Sees Dr. Eula Listen. Patient was not a TPA candidate secondary to resolving symptoms. He was admitted for further evaluation and treatment.  UPDATE 04/01/13 (LL): Patient returns for 4 month stroke revisit.  He has done well, no residual neurological deficits.  30 day cardiac monitor was negative.  Outpatient TEE showed a small PFO.  His BP is well controlled he states, though it is elevated in office today, 150/70.  He states he has his annual visit coming up with his PCP.  He states he stays active, working in his yard.  He is retired Company secretary.  He is tolerating daily aspirin 325 mg, with  No bleeding or excessive bruising.    REVIEW OF SYSTEMS: Full 14 system review of systems performed and notable only for:  Constitutional: N/A  Cardiovascular: N/A  Ear/Nose/Throat: N/A  Skin: N/A  Eyes: N/A  Respiratory: N/A  Gastroitestinal: N/A  Genitourinary: N/A Hematology/Lymphatic: N/A  Endocrine: N/A Musculoskeletal:N/A  Allergy/Immunology: allergies  Neurological: N/A Psychiatric: N/A Sleep: N/A   ALLERGIES: No Known Allergies  HOME MEDICATIONS: Outpatient Prescriptions Prior to Visit  Medication Sig Dispense Refill  . aspirin 325 MG tablet Take 1 tablet (325 mg total) by mouth daily.  1 tablet  1  . Budesonide (PULMICORT FLEXHALER) 90 MCG/ACT inhaler Inhale 1 puff into the lungs 2 (two) times daily.      . fluticasone (FLONASE) 50 MCG/ACT nasal spray Place 2 sprays into the nose  daily.      . salmeterol (SEREVENT) 50 MCG/DOSE diskus inhaler Inhale 1 puff into the lungs 2 (two) times daily.      . sucralfate (CARAFATE) 1 G tablet Take 1 g by mouth 2 (two) times daily.       No facility-administered medications prior to visit.    PAST MEDICAL HISTORY: Past Medical History  Diagnosis Date  . Asthma   . Vertigo   . Stroke     5.1.14  . Hypoglycemia     PAST SURGICAL HISTORY: Past Surgical History  Procedure Laterality Date  . Hand surgery      rt hand  . Laparoscopic gastrotomy w/ repair of ulcer  1970  . Herina    . Hernia repair    . Tee without cardioversion N/A 01/16/2013    Procedure: TRANSESOPHAGEAL ECHOCARDIOGRAM (TEE);  Surgeon: Vesta Mixer, MD;  Location: [facility] ENDOSCOPY;  Service: Cardiovascular;  Laterality: N/A;    FAMILY HISTORY: Family History  Problem Relation Age of Onset  . Cancer Brother     lung ca x 2 brothers    SOCIAL HISTORY: History   Social History  . Marital Status: Married    Spouse Name: N/A    Number of Children: N/A  . Years of Education: N/A   Occupational History  . Not on file.   Social History Main Topics  . Smoking status: Former Games developer  . Smokeless tobacco: Not on file     Comment: quit 1970  . Alcohol  Use: Yes  . Drug Use: No  . Sexual Activity: Not on file   Other Topics Concern  . Not on file   Social History Narrative  . No narrative on file     PHYSICAL EXAM  Filed Vitals:   04/01/13 1446  BP: 150/70  Pulse: 69  Height: 5\' 9"  (1.753 m)  Weight: 175 lb (79.379 kg)   Body mass index is 25.83 kg/(m^2).  Generalized: Well developed, elderly Caucasian male, in no acute distress  Head: normocephalic and atraumatic. Oropharynx benign  Neck: Supple, no carotid bruits  Cardiac: Regular rate rhythm, no murmur  Musculoskeletal: No deformity   Neurological examination   Mentation: Alert oriented to time, place, history taking. Follows all commands speech and language fluent Cranial  nerve II-XII: Fundoscopic exam reveals sharp disc margins.Pupils were equal round reactive to light extraocular movements were full, visual field were full on confrontational test. Facial sensation and strength were normal. hearing was intact to finger rubbing bilaterally. Uvula tongue midline. head turning and shoulder shrug and were normal and symmetric.Tongue protrusion into cheek strength was normal. Motor: normal bulk and tone, full strength in the BUE, BLE, fine finger movements normal, no pronator drift. No focal weakness Sensory: normal and symmetric to light touch, pinprick, and  vibration  Coordination: finger-nose-finger, heel-to-shin bilaterally, no dysmetria Reflexes: Brachioradialis 2/2, biceps 2/2, triceps 2/2, patellar 2/2, Achilles 2/2 Gait and Station: Rising up from seated position without assistance, normal stance, without trunk ataxia, moderate stride, good arm swing, smooth turning, able to perform tiptoe, and heel walking without difficulty.   DIAGNOSTIC DATA (LABS, IMAGING, TESTING) - I reviewed patient records, labs, notes, testing and imaging myself where available.  Lab Results  Component Value Date   WBC 6.1 01/03/2013   HGB 13.2 01/03/2013   HCT 39.1 01/03/2013   MCV 96.1 01/03/2013   PLT 219.0 01/03/2013      Component Value Date/Time   NA 135 01/03/2013 1553   K 4.4 01/03/2013 1553   CL 101 01/03/2013 1553   CO2 30 01/03/2013 1553   GLUCOSE 108* 01/03/2013 1553   BUN 9 01/03/2013 1553   CREATININE 1.1 01/03/2013 1553   CALCIUM 9.0 01/03/2013 1553   PROT 7.2 11/07/2012 1817   ALBUMIN 4.3 11/07/2012 1817   AST 23 11/07/2012 1817   ALT 12 11/07/2012 1817   ALKPHOS 51 11/07/2012 1817   BILITOT 0.4 11/07/2012 1817   GFRNONAA 55* 11/07/2012 1817   GFRAA 63* 11/07/2012 1817   Lab Results  Component Value Date   CHOL 183 11/08/2012   HDL 86 11/08/2012   LDLCALC 83 11/08/2012   TRIG 72 11/08/2012   CHOLHDL 2.1 11/08/2012   Lab Results  Component Value Date   HGBA1C 6.0* 11/08/2012     ASSESSMENT AND PLAN [name] is a 79 year old Caucasian gentleman who had a stroke on 11/07/12. MRI revealed multiple small areas of stroke, likely of unknown embolic source. Carotid dopplers were negative for significant stenosis. Symptoms of aphasia quickly resolved in hospital.   Continue aspirin 325 mg orally every day for secondary stroke prevention and maintain strict control of hypertension with blood pressure goal below 130/90, diabetes with hemoglobin A1c goal below 6.5% and lipids with LDL cholesterol goal below 100 mg/dL.  Followup in 6 months.  Ronal Fear, MSN, NP-C 04/01/2013, 3:21 PM [facility] [address] [place], [place] [address] [phone]

## 2013-04-01 NOTE — Patient Instructions (Addendum)
Continue aspirin 325 mg orally every day  for secondary stroke prevention and maintain strict control of hypertension with blood pressure goal below 130/90, diabetes with hemoglobin A1c goal below 6.5% and lipids with LDL cholesterol goal below 100 mg/dL. Followup in the future with me in 6 months.    STROKE/TIA INSTRUCTIONS SMOKING Cigarette smoking nearly doubles your risk of having a stroke & is the single most alterable risk factor  If you smoke or have smoked in the last 12 months, you are advised to quit smoking for your health.  Most of the excess cardiovascular risk related to smoking disappears within a year of stopping.  Ask you doctor about anti-smoking medications   Quit Line: 1-800-QUIT NOW  Free Smoking Cessation Classes ([phone]  CHOLESTEROL Know your levels; limit fat & cholesterol in your diet  Lab Results  Component Value Date   CHOL 183 11/08/2012   HDL 86 11/08/2012   LDLCALC 83 11/08/2012   TRIG 72 11/08/2012   CHOLHDL 2.1 11/08/2012      Many patients benefit from treatment even if their cholesterol is at goal.  Goal: Total Cholesterol less than 160  Goal:  LDL less than 100  Goal:  HDL greater than 40  Goal:  Triglycerides less than 150  BLOOD PRESSURE American Stroke Association blood pressure target is less that 120/80 mm/Hg  Your discharge blood pressure is:  BP: 150/70 mmHg  Monitor your blood pressure  Limit your salt and alcohol intake  Many individuals will require more than one medication for high blood pressure  DIABETES (A1c is a blood sugar average for last 3 months) Goal A1c is under 7% (A1c is blood sugar average for last 3 months)  Diabetes: No known diagnosis of diabetes    Lab Results  Component Value Date   HGBA1C 6.0* 11/08/2012    Your A1c can be lowered with medications, healthy diet, and exercise.  Check your blood sugar as directed by your physician  Call your physician if you experience unexplained or low blood sugars.   PHYSICAL ACTIVITY/REHABILITATION Goal is 30 minutes at least 4 days per week    Activity decreases your risk of heart attack and stroke and makes your heart stronger.  It helps control your weight and blood pressure; helps you relax and can improve your mood.  Participate in a regular exercise program.  Talk with your doctor about the best form of exercise for you (dancing, walking, swimming, cycling).  DIET/WEIGHT Goal is to maintain a healthy weight  Your height is:  Height: 5\' 9"  (175.3 cm) Your current weight is: Weight: 175 lb (79.379 kg) Your body Mass Index (BMI) is:  BMI (Calculated): 25.9  Following the type of diet specifically designed for you will help prevent another stroke.  Your goal Body Mass Index (BMI) is 19-24.  Healthy food habits can help reduce 3 risk factors for stroke:  High cholesterol, hypertension, and excess weight.

## 2013-05-15 ENCOUNTER — Other Ambulatory Visit: Payer: Self-pay

## 2013-07-08 DIAGNOSIS — K279 Peptic ulcer, site unspecified, unspecified as acute or chronic, without hemorrhage or perforation: Secondary | ICD-10-CM | POA: Diagnosis not present

## 2013-07-08 DIAGNOSIS — N400 Enlarged prostate without lower urinary tract symptoms: Secondary | ICD-10-CM | POA: Diagnosis not present

## 2013-07-08 DIAGNOSIS — Z1331 Encounter for screening for depression: Secondary | ICD-10-CM | POA: Diagnosis not present

## 2013-07-08 DIAGNOSIS — I679 Cerebrovascular disease, unspecified: Secondary | ICD-10-CM | POA: Diagnosis not present

## 2013-07-08 DIAGNOSIS — R030 Elevated blood-pressure reading, without diagnosis of hypertension: Secondary | ICD-10-CM | POA: Diagnosis not present

## 2013-07-08 DIAGNOSIS — R7301 Impaired fasting glucose: Secondary | ICD-10-CM | POA: Diagnosis not present

## 2013-07-08 DIAGNOSIS — E782 Mixed hyperlipidemia: Secondary | ICD-10-CM | POA: Diagnosis not present

## 2013-07-08 DIAGNOSIS — Z0000 Encounter for general adult medical examination without abnormal findings: Secondary | ICD-10-CM | POA: Diagnosis not present

## 2013-07-24 DIAGNOSIS — D485 Neoplasm of uncertain behavior of skin: Secondary | ICD-10-CM | POA: Diagnosis not present

## 2013-09-29 ENCOUNTER — Ambulatory Visit: Payer: Medicare Other | Admitting: Nurse Practitioner

## 2013-09-29 DIAGNOSIS — J209 Acute bronchitis, unspecified: Secondary | ICD-10-CM | POA: Diagnosis not present

## 2013-09-29 DIAGNOSIS — J45909 Unspecified asthma, uncomplicated: Secondary | ICD-10-CM | POA: Diagnosis not present

## 2013-11-04 DIAGNOSIS — J45909 Unspecified asthma, uncomplicated: Secondary | ICD-10-CM | POA: Diagnosis not present

## 2013-11-04 DIAGNOSIS — K279 Peptic ulcer, site unspecified, unspecified as acute or chronic, without hemorrhage or perforation: Secondary | ICD-10-CM | POA: Diagnosis not present

## 2013-11-04 DIAGNOSIS — I6990 Unspecified sequelae of unspecified cerebrovascular disease: Secondary | ICD-10-CM | POA: Diagnosis not present

## 2013-11-12 ENCOUNTER — Ambulatory Visit (INDEPENDENT_AMBULATORY_CARE_PROVIDER_SITE_OTHER): Payer: Medicare Other | Admitting: Nurse Practitioner

## 2013-11-12 ENCOUNTER — Encounter: Payer: Self-pay | Admitting: Nurse Practitioner

## 2013-11-12 VITALS — BP 107/62 | HR 71 | Ht 70.0 in | Wt 175.0 lb

## 2013-11-12 DIAGNOSIS — R4701 Aphasia: Secondary | ICD-10-CM | POA: Diagnosis not present

## 2013-11-12 DIAGNOSIS — I6350 Cerebral infarction due to unspecified occlusion or stenosis of unspecified cerebral artery: Secondary | ICD-10-CM

## 2013-11-12 NOTE — Progress Notes (Signed)
PATIENT: Craig Vazquez DOB: 03-18-1931  REASON FOR VISIT: routine stroke follow up HISTORY FROM: patient  HISTORY OF PRESENT ILLNESS: Craig Vazquez is an 80 y.o. male who was LKW 1650 on 11/07/2012 by his wife, she left him alone for 10 minutes, upon return she found him to have garbled speech with some drooling. No extremity weakness, confusion or fall. He has been working outside today for a couple of hours in the yard. Does not take aspirin. Patient has history of asthma and BPPV. Sees Dr. Deforest Hoyles. Patient was not a TPA candidate secondary to resolving symptoms. He was admitted for further evaluation and treatment.   UPDATE 04/01/13 (LL): Patient returns for 4 month stroke revisit. He has done well, no residual neurological deficits. 30 day cardiac monitor was negative. Outpatient TEE showed a small PFO. His BP is well controlled he states, though it is elevated in office today, 150/70. He states he has his annual visit coming up with his PCP. He states he stays active, working in his yard. He is retired Social research officer, government. He is tolerating daily aspirin 325 mg, with No bleeding or excessive bruising.   UPDATE 11/12/13 (LL): Craig Vazquez returns for routine stroke follow up.  Still doing well, only very mild expressive aphasia when tired.  No new neurovascular symptoms.  His blood pressure remains well controlled without medication.  BP in office today is 107/62.  He is tolerating aspirin well with no signs of significant bleeding or bruising.  REVIEW OF SYSTEMS: Full 14 system review of systems performed and notable only for:  Eye itching, frequency of urination, speech difficulty  ALLERGIES: No Known Allergies  HOME MEDICATIONS: Outpatient Prescriptions Prior to Visit  Medication Sig Dispense Refill  . aspirin 325 MG tablet Take 1 tablet (325 mg total) by mouth daily.  1 tablet  1  . Budesonide (PULMICORT FLEXHALER) 90 MCG/ACT inhaler Inhale 1 puff into the lungs 2 (two) times daily.      .  fluticasone (FLONASE) 50 MCG/ACT nasal spray Place 2 sprays into the nose daily.      . salmeterol (SEREVENT) 50 MCG/DOSE diskus inhaler Inhale 1 puff into the lungs 2 (two) times daily.      . sucralfate (CARAFATE) 1 G tablet Take 1 g by mouth 2 (two) times daily.       No facility-administered medications prior to visit.   PHYSICAL EXAM  Filed Vitals:   11/12/13 1518  BP: 107/62  Pulse: 71  Height: 5\' 10"  (1.778 m)  Weight: 175 lb (79.379 kg)   Body mass index is 25.11 kg/(m^2).  Generalized: Well developed, elderly Caucasian male, in no acute distress  Head: normocephalic and atraumatic. Oropharynx benign  Neck: Supple, no carotid bruits  Cardiac: Regular rate rhythm, no murmur  Musculoskeletal: No deformity   Neurological examination  Mentation: Alert oriented to time, place, history taking. Follows all commands speech and language mildly dysfluent  Cranial nerve II-XII: Pupils were equal round reactive to light extraocular movements were full, visual field were full on confrontational test. Facial sensation and strength were normal. hearing was intact to finger rubbing bilaterally. Uvula tongue midline. head turning and shoulder shrug and were normal and symmetric.Tongue protrusion into cheek strength was normal.  Motor: normal bulk and tone, full strength in the BUE, BLE, fine finger movements normal, no pronator drift. No focal weakness  Sensory: normal and symmetric to light touch, pinprick, and vibration  Coordination: finger-nose-finger, heel-to-shin bilaterally, no dysmetria  Reflexes: Brachioradialis  2/2, biceps 2/2, triceps 2/2, patellar 2/2, Achilles 2/2  Gait and Station: Rising up from seated position without assistance, normal stance, without trunk ataxia, moderate stride, good arm swing, smooth turning, able to perform tiptoe, and heel walking without difficulty.   ASSESSMENT AND PLAN Craig Vazquez is a 80 year old Caucasian gentleman who had a stroke on 11/07/12.  MRI revealed multiple small areas of stroke, likely of unknown embolic source. Carotid dopplers were negative for significant stenosis. Symptoms of aphasia quickly resolved in hospital.   Continue aspirin 325 mg orally every day for secondary stroke prevention and maintain strict control of hypertension with blood pressure goal below 130/90, diabetes with hemoglobin A1c goal below 6.5% and lipids with LDL cholesterol goal below 100 mg/dL.  Followup in 6 months, sooner as needed.  Philmore Pali, MSN, NP-C 11/12/2013, 3:35 PM [facility] [address], [place], [place] [address] [phone]  Note: This document was prepared with digital dictation and possible smart phrase technology. Any transcriptional errors that result from this process are unintentional.

## 2013-11-12 NOTE — Patient Instructions (Addendum)
Continue aspirin 325 mg orally every day for secondary stroke prevention and maintain strict control of hypertension with blood pressure goal below 130/90, diabetes with hemoglobin A1c goal below 6.5% and lipids with LDL cholesterol goal below 100 mg/dL.  Followup in 6 months with Dr. Leonie Man, sooner as needed.

## 2013-12-18 DIAGNOSIS — J45909 Unspecified asthma, uncomplicated: Secondary | ICD-10-CM | POA: Diagnosis not present

## 2013-12-19 NOTE — Progress Notes (Signed)
I reviewed note and agree with plan.   Elvis Boot R. Maysen Sudol, MD  Certified in Neurology, Neurophysiology and Neuroimaging  [facility] [address], [address] [place], [place] [address] [phone]   

## 2014-01-29 DIAGNOSIS — Z961 Presence of intraocular lens: Secondary | ICD-10-CM | POA: Diagnosis not present

## 2014-02-25 ENCOUNTER — Ambulatory Visit
Admission: RE | Admit: 2014-02-25 | Discharge: 2014-02-25 | Disposition: A | Discharge status: Home / Self Care | Payer: Medicare Other | Source: Ambulatory Visit | Attending: Internal Medicine | Admitting: Internal Medicine

## 2014-02-25 ENCOUNTER — Other Ambulatory Visit: Payer: Self-pay | Admitting: Internal Medicine

## 2014-02-25 DIAGNOSIS — R252 Cramp and spasm: Secondary | ICD-10-CM | POA: Diagnosis not present

## 2014-02-25 DIAGNOSIS — K279 Peptic ulcer, site unspecified, unspecified as acute or chronic, without hemorrhage or perforation: Secondary | ICD-10-CM | POA: Diagnosis not present

## 2014-02-25 DIAGNOSIS — M549 Dorsalgia, unspecified: Secondary | ICD-10-CM | POA: Diagnosis not present

## 2014-02-25 DIAGNOSIS — J45909 Unspecified asthma, uncomplicated: Secondary | ICD-10-CM | POA: Diagnosis not present

## 2014-02-25 DIAGNOSIS — IMO0002 Reserved for concepts with insufficient information to code with codable children: Secondary | ICD-10-CM | POA: Diagnosis not present

## 2014-05-20 ENCOUNTER — Encounter: Payer: Self-pay | Admitting: Neurology

## 2014-05-20 ENCOUNTER — Ambulatory Visit (INDEPENDENT_AMBULATORY_CARE_PROVIDER_SITE_OTHER): Payer: Medicare Other | Admitting: Neurology

## 2014-05-20 VITALS — BP 122/64 | HR 68 | Ht 70.0 in | Wt 169.6 lb

## 2014-05-20 DIAGNOSIS — I6300 Cerebral infarction due to thrombosis of unspecified precerebral artery: Secondary | ICD-10-CM

## 2014-05-20 DIAGNOSIS — I639 Cerebral infarction, unspecified: Secondary | ICD-10-CM

## 2014-05-20 NOTE — Patient Instructions (Signed)
I had a long discussion with the patient regarding his remote stroke, risk factors, secondary stroke prevention strategies and answered questions. I recommend he stay on aspirin for stroke prevention. Check follow-up carotid ultrasound study. Return for follow-up in a year or call earlier if necessary

## 2014-05-20 NOTE — Progress Notes (Signed)
PATIENT: Craig Vazquez DOB: October 12, 1930  REASON FOR VISIT: routine stroke follow up HISTORY FROM: patient  HISTORY OF PRESENT ILLNESS: Craig Vazquez is an 80 y.o. 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 He has done well, no residual neurological deficits. 30 day cardiac monitor was negative. Outpatient TEE showed a small PFO. His BP is well controlled he states, though it is elevated in office today, 150/70. He states he has his annual visit coming up with his PCP. He states he stays active, working in his yard. He is retired Social research officer, government. He is tolerating daily aspirin 325 mg, with No bleeding or excessive bruising.   UPDATE 11/12/13 (LL): Craig Vazquez returns for routine stroke follow up.  Still doing well, only very mild expressive aphasia when tired.  No new neurovascular symptoms.  His blood pressure remains well controlled without medication.  BP in office today is 107/62.  He is tolerating aspirin well with no signs of significant bleeding or bruising. UPDATE 05/20/2014 (PS) : he returns for follow-up after last visit 6 months ago. He is doing well without recurrent stroke or TIA symptoms. He continues to have mild word finding difficulties particularly when tired but states his memory is good. He is independent in activities of daily living. He has not had any recurrent neurovascular symptoms. He is tolerating aspirin well without any bleeding or bruising.His blood pressure is under good control He has no new complaints REVIEW OF SYSTEMS: Full 14 system review of systems performed and notable only for:  Swallowing difficulty, speech difficulty and all other systems negative  ALLERGIES: No Known Allergies  HOME MEDICATIONS: Outpatient Prescriptions Prior to Visit  Medication Sig Dispense Refill  . aspirin 325 MG tablet Take 1 tablet (325 mg total) by mouth daily. 1 tablet 1   . Budesonide (PULMICORT FLEXHALER) 90 MCG/ACT inhaler Inhale 1 puff into the lungs 2 (two) times daily.    . fluticasone (FLONASE) 50 MCG/ACT nasal spray Place 2 sprays into the nose daily.    . salmeterol (SEREVENT) 50 MCG/DOSE diskus inhaler Inhale 1 puff into the lungs 2 (two) times daily.    . sucralfate (CARAFATE) 1 G tablet Take 1 g by mouth 2 (two) times daily.     No facility-administered medications prior to visit.   PHYSICAL EXAM  Filed Vitals:   05/20/14 1508  BP: 122/64  Pulse: 68  Height: 5\' 10"  (1.778 m)  Weight: 169 lb 9.6 oz (76.93 kg)   Body mass index is 24.34 kg/(m^2).  Generalized: Well developed, elderly Caucasian male, in no acute distress  Head: normocephalic and atraumatic. Oropharynx benign  Neck: Supple, no carotid bruits  Cardiac: Regular rate rhythm, no murmur  Musculoskeletal: No deformity   Neurological examination  Mentation: Alert oriented to time, place, history taking. Follows all commands speech and language mildly dysfluent  Cranial nerve II-XII: Pupils were equal round reactive to light extraocular movements were full, visual field were full on confrontational test. Facial sensation and strength were normal. hearing was intact to finger rubbing bilaterally. Uvula tongue midline. head turning and shoulder shrug and were normal and symmetric.Tongue protrusion into cheek strength was normal.  Motor: normal bulk and tone, full strength in the BUE, BLE, fine finger movements normal, no pronator drift. No focal weakness  Sensory: normal and symmetric to light touch, pinprick, and vibration  Coordination: finger-nose-finger, heel-to-shin bilaterally, no dysmetria  Reflexes:  2+ symmetric Gait and Station: Rising up from seated position without assistance, normal stance, without  ataxia, moderate stride, good arm swing, smooth turning, able to perform tiptoe, and heel walking without difficulty.   ASSESSMENT AND PLAN Craig Vazquez is a 80 year old  Caucasian gentleman who had a stroke on 11/07/12. MRI revealed multiple small bicerebral areas of stroke, likely of unknown embolic source.  Mild intermittent word finding difficulties likely due to age-related cognitive impairment I had a long discussion with the patient regarding his remote stroke, risk factors, secondary stroke prevention strategies and answered questions. I recommend he stay on aspirin for stroke prevention. Check follow-up carotid ultrasound study. Return for follow-up in a year or call earlier if necessary Craig Contras, MD  05/20/2014, 3:33 PM [facility] [address] [place], [place] [address] [phone]  Note: This document was prepared with digital dictation and possible smart phrase technology. Any transcriptional errors that result from this process are unintentional.

## 2014-05-27 DIAGNOSIS — H4011X1 Primary open-angle glaucoma, mild stage: Secondary | ICD-10-CM | POA: Diagnosis not present

## 2014-06-25 ENCOUNTER — Ambulatory Visit (INDEPENDENT_AMBULATORY_CARE_PROVIDER_SITE_OTHER): Payer: Medicare Other

## 2014-06-25 DIAGNOSIS — I6300 Cerebral infarction due to thrombosis of unspecified precerebral artery: Secondary | ICD-10-CM | POA: Diagnosis not present

## 2014-06-29 DIAGNOSIS — N400 Enlarged prostate without lower urinary tract symptoms: Secondary | ICD-10-CM | POA: Diagnosis not present

## 2014-06-29 DIAGNOSIS — R7309 Other abnormal glucose: Secondary | ICD-10-CM | POA: Diagnosis not present

## 2014-06-29 DIAGNOSIS — R972 Elevated prostate specific antigen [PSA]: Secondary | ICD-10-CM | POA: Diagnosis not present

## 2014-06-29 DIAGNOSIS — E780 Pure hypercholesterolemia: Secondary | ICD-10-CM | POA: Diagnosis not present

## 2014-06-29 DIAGNOSIS — Z0000 Encounter for general adult medical examination without abnormal findings: Secondary | ICD-10-CM | POA: Diagnosis not present

## 2014-06-29 DIAGNOSIS — I639 Cerebral infarction, unspecified: Secondary | ICD-10-CM | POA: Diagnosis not present

## 2014-06-29 DIAGNOSIS — Z1389 Encounter for screening for other disorder: Secondary | ICD-10-CM | POA: Diagnosis not present

## 2014-06-29 DIAGNOSIS — J45909 Unspecified asthma, uncomplicated: Secondary | ICD-10-CM | POA: Diagnosis not present

## 2014-06-29 DIAGNOSIS — Z23 Encounter for immunization: Secondary | ICD-10-CM | POA: Diagnosis not present

## 2014-06-29 DIAGNOSIS — K279 Peptic ulcer, site unspecified, unspecified as acute or chronic, without hemorrhage or perforation: Secondary | ICD-10-CM | POA: Diagnosis not present

## 2014-11-27 DIAGNOSIS — L0213 Carbuncle of neck: Secondary | ICD-10-CM | POA: Diagnosis not present

## 2014-11-27 DIAGNOSIS — L720 Epidermal cyst: Secondary | ICD-10-CM | POA: Diagnosis not present

## 2014-11-27 DIAGNOSIS — L739 Follicular disorder, unspecified: Secondary | ICD-10-CM | POA: Diagnosis not present

## 2014-12-02 DIAGNOSIS — H60332 Swimmer's ear, left ear: Secondary | ICD-10-CM | POA: Diagnosis not present

## 2014-12-10 DIAGNOSIS — H7202 Central perforation of tympanic membrane, left ear: Secondary | ICD-10-CM | POA: Diagnosis not present

## 2014-12-25 DIAGNOSIS — L814 Other melanin hyperpigmentation: Secondary | ICD-10-CM | POA: Diagnosis not present

## 2014-12-25 DIAGNOSIS — L821 Other seborrheic keratosis: Secondary | ICD-10-CM | POA: Diagnosis not present

## 2014-12-25 DIAGNOSIS — L570 Actinic keratosis: Secondary | ICD-10-CM | POA: Diagnosis not present

## 2014-12-25 DIAGNOSIS — D225 Melanocytic nevi of trunk: Secondary | ICD-10-CM | POA: Diagnosis not present

## 2014-12-25 DIAGNOSIS — L739 Follicular disorder, unspecified: Secondary | ICD-10-CM | POA: Diagnosis not present

## 2015-02-01 DIAGNOSIS — H3530 Unspecified macular degeneration: Secondary | ICD-10-CM | POA: Diagnosis not present

## 2015-03-18 ENCOUNTER — Other Ambulatory Visit: Payer: Self-pay | Admitting: Dermatology

## 2015-03-18 DIAGNOSIS — D485 Neoplasm of uncertain behavior of skin: Secondary | ICD-10-CM | POA: Diagnosis not present

## 2015-03-18 DIAGNOSIS — L0213 Carbuncle of neck: Secondary | ICD-10-CM | POA: Diagnosis not present

## 2015-03-18 DIAGNOSIS — L720 Epidermal cyst: Secondary | ICD-10-CM | POA: Diagnosis not present

## 2015-03-18 DIAGNOSIS — C4442 Squamous cell carcinoma of skin of scalp and neck: Secondary | ICD-10-CM | POA: Diagnosis not present

## 2015-04-03 DIAGNOSIS — L298 Other pruritus: Secondary | ICD-10-CM | POA: Diagnosis not present

## 2015-04-21 DIAGNOSIS — Z23 Encounter for immunization: Secondary | ICD-10-CM | POA: Diagnosis not present

## 2015-05-17 ENCOUNTER — Encounter: Payer: Self-pay | Admitting: Nurse Practitioner

## 2015-05-17 ENCOUNTER — Ambulatory Visit (INDEPENDENT_AMBULATORY_CARE_PROVIDER_SITE_OTHER): Payer: Medicare Other | Admitting: Nurse Practitioner

## 2015-05-17 ENCOUNTER — Ambulatory Visit: Payer: Self-pay | Admitting: Nurse Practitioner

## 2015-05-17 VITALS — BP 128/60 | HR 76 | Ht 70.0 in | Wt 166.4 lb

## 2015-05-17 DIAGNOSIS — I639 Cerebral infarction, unspecified: Secondary | ICD-10-CM

## 2015-05-17 NOTE — Progress Notes (Signed)
[facility]  PATIENT: Craig Vazquez DOB: Jun 17, 1931   REASON FOR VISIT: Follow-up for history of CVA, 11/07/2012 HISTORY FROM: Patient    HISTORY OF PRESENT ILLNESS: UPDATE 04/01/13 (LL): Patient returns for 4 month stroke revisit. He has done well, no residual neurological deficits. 30 day cardiac monitor was negative. Outpatient TEE showed a small PFO. His BP is well controlled he states, though it is elevated in office today, 150/70. He states he has his annual visit coming up with his PCP. He states he stays active, working in his yard. He is retired Social research officer, government. He is tolerating daily aspirin 325 mg, with No bleeding or excessive bruising.   UPDATE 11/12/13 (LL): [name] returns for routine stroke follow up. Still doing well, only very mild expressive aphasia when tired. No new neurovascular symptoms. His blood pressure remains well controlled without medication. BP in office today is 107/62. He is tolerating aspirin well with no signs of significant bleeding or bruising. UPDATE 05/20/2014 (PS) : he returns for follow-up after last visit 6 months ago. He is doing well without recurrent stroke or TIA symptoms. He continues to have mild word finding difficulties particularly when tired but states his memory is good. He is independent in activities of daily living. He has not had any recurrent neurovascular symptoms. He is tolerating aspirin well without any bleeding or bruising.His blood pressure is under good control He has no new complaints UPDATE 05/17/15  [name] , 81 year old male returns for follow-up. He had a stroke 11/07/2012.  MRI that time showed multiple small areas of stroke likely a pontine embolic source. He has been maintained on aspirin 325 mg daily without further stroke or TIA symptoms.  Carotid Doppler after last visit negative for significant stenosis. Patient given a copy. B/P in the office today 128/60. He continues to be very active , reports that  his memory is good. Appetite is good he is sleeping well. He returns for reevaluation  REVIEW OF SYSTEMS: Full 14 system review of systems performed and notable only for those listed, all others are neg:  Constitutional: neg  Cardiovascular: neg Ear/Nose/Throat: neg  Skin: neg Eyes: neg Respiratory: neg Gastroitestinal: neg  Hematology/Lymphatic: neg  Endocrine: neg Musculoskeletal:neg Allergy/Immunology: neg Neurological: neg Psychiatric: neg Sleep : neg   ALLERGIES: No Known Allergies  HOME MEDICATIONS: Outpatient Prescriptions Prior to Visit  Medication Sig Dispense Refill  . aspirin 325 MG tablet Take 1 tablet (325 mg total) by mouth daily. 1 tablet 1  . Budesonide (PULMICORT FLEXHALER) 90 MCG/ACT inhaler Inhale 1 puff into the lungs 2 (two) times daily.    . fluticasone (FLONASE) 50 MCG/ACT nasal spray Place 2 sprays into the nose daily.    . salmeterol (SEREVENT) 50 MCG/DOSE diskus inhaler Inhale 1 puff into the lungs 2 (two) times daily.    . sucralfate (CARAFATE) 1 G tablet Take 1 g by mouth 2 (two) times daily.     No facility-administered medications prior to visit.    PAST MEDICAL HISTORY: Past Medical History  Diagnosis Date  . Asthma   . Vertigo   . Stroke ([place])     5.1.14  . Hypoglycemia     PAST SURGICAL HISTORY: Past Surgical History  Procedure Laterality Date  . Hand surgery      rt hand  . Laparoscopic gastrotomy w/ repair of ulcer  1970  . Herina    . Hernia repair    . Tee without cardioversion N/A 01/16/2013  Procedure: TRANSESOPHAGEAL ECHOCARDIOGRAM (TEE);  Surgeon: Thayer Headings, MD;  Location: [facility] ENDOSCOPY;  Service: Cardiovascular;  Laterality: N/A;    FAMILY HISTORY: Family History  Problem Relation Age of Onset  . Cancer Brother     lung ca x 2 brothers    SOCIAL HISTORY: Social History   Social History  . Marital Status: Married    Spouse Name: N/A  . Number of Children: 2  . Years of Education: DOCTRATE    Occupational History  . retired    Social History Main Topics  . Smoking status: Former Research scientist (life sciences)  . Smokeless tobacco: Never Used     Comment: quit 1970  . Alcohol Use: Yes     Comment: BEER/WINE OCC  . Drug Use: No  . Sexual Activity: Not on file   Other Topics Concern  . Not on file   Social History Narrative   Patient is married with 2 children.   Patient is ambidextrous.   Patient has a Statistician in criminology.   Patient drinks 1-2 cups daily.     PHYSICAL EXAM  Filed Vitals:   05/17/15 1357  BP: 128/60  Pulse: 76  Height: 5\' 10"  (1.778 m)  Weight: 166 lb 6.4 oz (75.479 kg)   Body mass index is 23.88 kg/(m^2). Generalized: Well developed, elderly Caucasian male, in no acute distress  Head: normocephalic and atraumatic. Oropharynx benign  Neck: Supple, no carotid bruits  Cardiac: Regular rate rhythm, no murmur  Musculoskeletal: No deformity    Neurological examination  Mentation: Alert oriented to time, place, history taking. Follows all commands speech and language mildly dysfluent  Cranial nerve II-XII: Pupils were equal round reactive to light extraocular movements were full, visual field were full on confrontational test. Facial sensation and strength were normal. hearing was intact to finger rubbing bilaterally. Uvula tongue midline. head turning and shoulder shrug and were normal and symmetric.Tongue protrusion into cheek strength was normal.  Motor: normal bulk and tone, full strength in the BUE, BLE, fine finger movements normal, no pronator drift. No focal weakness  Sensory: normal and symmetric to light touch, pinprick, and vibration  Coordination: finger-nose-finger, heel-to-shin bilaterally, no dysmetria  Reflexes: 2+ symmetric Gait and Station: Rising up from seated position without assistance, normal stance, without ataxia, moderate stride, good arm swing, smooth turning, able to perform tiptoe, and heel walking without difficulty.   DIAGNOSTIC DATA (LABS, IMAGING, TESTING) -  ASSESSMENT AND PLAN  81 y.o. year old male  has a past medical history of  Stroke [facility]); which occurred in May 2014. He has not had further stroke TIA symptoms since that time. He has been well-controlled on aspirin 325 daily. Carotid Doppler after last visit November 2015 was negative for significant stenosis of the extracranial carotid arteries.  Monitoring risk factors is essential to prevent further stroke Keep systolic blood pressure less than 130, today's reading 128/60 Lipids are followed by PCP last cholesterol on file 183, LDL 83 Last Carotid Doppler  Negative for significant stenosis , copy to patient Continue  Aspirin 325 mg for secondary stroke prevention No further stroke or TIA symptoms since 11/07/2012  If recurrent stroke symptoms occur, call 911 and proceed to the hospital Discharge from neurologic services at this time    Dennie Bible, [facility], [facility], [facility] [address], [place], [place] [address] [phone]

## 2015-05-17 NOTE — Patient Instructions (Addendum)
Monitoring risk factors is essential to prevent further stroke Keep systolic blood pressure less than 130, today's reading 128/60 Lipids are followed by PCP last cholesterol on file 183LDL 83 Last Carotid Doppler  Negative for significant stenosis , copy to patient Continue  Aspirin 325 mg for secondary stroke prevention No further stroke or TIA symptoms since 11/07/2012  If recurrent stroke symptoms occur, call 911 and proceed to the hospital Discharge from neurologic services at this time

## 2015-05-17 NOTE — Progress Notes (Signed)
I agree with the above plan 

## 2015-05-21 ENCOUNTER — Ambulatory Visit: Payer: TRICARE For Life (TFL) | Admitting: Neurology

## 2015-05-24 DIAGNOSIS — M720 Palmar fascial fibromatosis [Dupuytren]: Secondary | ICD-10-CM | POA: Diagnosis not present

## 2015-06-29 DIAGNOSIS — J069 Acute upper respiratory infection, unspecified: Secondary | ICD-10-CM | POA: Diagnosis not present

## 2015-07-06 DIAGNOSIS — J45909 Unspecified asthma, uncomplicated: Secondary | ICD-10-CM | POA: Diagnosis not present

## 2015-07-06 DIAGNOSIS — J069 Acute upper respiratory infection, unspecified: Secondary | ICD-10-CM | POA: Diagnosis not present

## 2015-07-06 DIAGNOSIS — N400 Enlarged prostate without lower urinary tract symptoms: Secondary | ICD-10-CM | POA: Diagnosis not present

## 2015-07-06 DIAGNOSIS — E7800 Pure hypercholesterolemia, unspecified: Secondary | ICD-10-CM | POA: Diagnosis not present

## 2015-07-06 DIAGNOSIS — I639 Cerebral infarction, unspecified: Secondary | ICD-10-CM | POA: Diagnosis not present

## 2015-07-06 DIAGNOSIS — Z0000 Encounter for general adult medical examination without abnormal findings: Secondary | ICD-10-CM | POA: Diagnosis not present

## 2015-07-06 DIAGNOSIS — R7309 Other abnormal glucose: Secondary | ICD-10-CM | POA: Diagnosis not present

## 2015-07-06 DIAGNOSIS — H409 Unspecified glaucoma: Secondary | ICD-10-CM | POA: Diagnosis not present

## 2015-07-06 DIAGNOSIS — K279 Peptic ulcer, site unspecified, unspecified as acute or chronic, without hemorrhage or perforation: Secondary | ICD-10-CM | POA: Diagnosis not present

## 2015-07-06 DIAGNOSIS — Z1389 Encounter for screening for other disorder: Secondary | ICD-10-CM | POA: Diagnosis not present

## 2015-07-06 DIAGNOSIS — K911 Postgastric surgery syndromes: Secondary | ICD-10-CM | POA: Diagnosis not present

## 2015-07-06 DIAGNOSIS — R972 Elevated prostate specific antigen [PSA]: Secondary | ICD-10-CM | POA: Diagnosis not present

## 2015-07-11 DIAGNOSIS — M1990 Unspecified osteoarthritis, unspecified site: Secondary | ICD-10-CM

## 2015-07-11 HISTORY — PX: MOHS SURGERY: SUR867

## 2015-07-11 HISTORY — DX: Unspecified osteoarthritis, unspecified site: M19.90

## 2015-07-11 HISTORY — PX: HERNIA REPAIR: SHX51

## 2015-07-16 DIAGNOSIS — Z1211 Encounter for screening for malignant neoplasm of colon: Secondary | ICD-10-CM | POA: Diagnosis not present

## 2015-11-01 DIAGNOSIS — T148 Other injury of unspecified body region: Secondary | ICD-10-CM | POA: Diagnosis not present

## 2015-11-10 DIAGNOSIS — R234 Changes in skin texture: Secondary | ICD-10-CM | POA: Diagnosis not present

## 2015-11-10 DIAGNOSIS — W57XXXA Bitten or stung by nonvenomous insect and other nonvenomous arthropods, initial encounter: Secondary | ICD-10-CM | POA: Diagnosis not present

## 2015-11-11 DIAGNOSIS — H40003 Preglaucoma, unspecified, bilateral: Secondary | ICD-10-CM | POA: Diagnosis not present

## 2015-11-11 DIAGNOSIS — H04123 Dry eye syndrome of bilateral lacrimal glands: Secondary | ICD-10-CM | POA: Diagnosis not present

## 2015-11-11 DIAGNOSIS — H3530 Unspecified macular degeneration: Secondary | ICD-10-CM | POA: Diagnosis not present

## 2015-11-11 DIAGNOSIS — H02831 Dermatochalasis of right upper eyelid: Secondary | ICD-10-CM | POA: Diagnosis not present

## 2015-12-09 DIAGNOSIS — B079 Viral wart, unspecified: Secondary | ICD-10-CM | POA: Diagnosis not present

## 2015-12-09 DIAGNOSIS — B070 Plantar wart: Secondary | ICD-10-CM | POA: Diagnosis not present

## 2015-12-22 DIAGNOSIS — N390 Urinary tract infection, site not specified: Secondary | ICD-10-CM | POA: Diagnosis not present

## 2016-01-26 DIAGNOSIS — J45909 Unspecified asthma, uncomplicated: Secondary | ICD-10-CM | POA: Diagnosis not present

## 2016-01-26 DIAGNOSIS — K279 Peptic ulcer, site unspecified, unspecified as acute or chronic, without hemorrhage or perforation: Secondary | ICD-10-CM | POA: Diagnosis not present

## 2016-01-26 DIAGNOSIS — Z1389 Encounter for screening for other disorder: Secondary | ICD-10-CM | POA: Diagnosis not present

## 2016-01-26 DIAGNOSIS — R7303 Prediabetes: Secondary | ICD-10-CM | POA: Diagnosis not present

## 2016-01-26 DIAGNOSIS — I639 Cerebral infarction, unspecified: Secondary | ICD-10-CM | POA: Diagnosis not present

## 2016-01-27 DIAGNOSIS — C44319 Basal cell carcinoma of skin of other parts of face: Secondary | ICD-10-CM | POA: Diagnosis not present

## 2016-02-23 DIAGNOSIS — H40053 Ocular hypertension, bilateral: Secondary | ICD-10-CM | POA: Diagnosis not present

## 2016-03-28 DIAGNOSIS — C44119 Basal cell carcinoma of skin of left eyelid, including canthus: Secondary | ICD-10-CM | POA: Diagnosis not present

## 2016-05-18 DIAGNOSIS — H40003 Preglaucoma, unspecified, bilateral: Secondary | ICD-10-CM | POA: Diagnosis not present

## 2016-07-11 ENCOUNTER — Other Ambulatory Visit: Payer: Self-pay | Admitting: Internal Medicine

## 2016-07-11 DIAGNOSIS — K279 Peptic ulcer, site unspecified, unspecified as acute or chronic, without hemorrhage or perforation: Secondary | ICD-10-CM | POA: Diagnosis not present

## 2016-07-11 DIAGNOSIS — J45909 Unspecified asthma, uncomplicated: Secondary | ICD-10-CM | POA: Diagnosis not present

## 2016-07-11 DIAGNOSIS — I639 Cerebral infarction, unspecified: Secondary | ICD-10-CM | POA: Diagnosis not present

## 2016-07-11 DIAGNOSIS — E7800 Pure hypercholesterolemia, unspecified: Secondary | ICD-10-CM | POA: Diagnosis not present

## 2016-07-11 DIAGNOSIS — Z1389 Encounter for screening for other disorder: Secondary | ICD-10-CM | POA: Diagnosis not present

## 2016-07-11 DIAGNOSIS — H8110 Benign paroxysmal vertigo, unspecified ear: Secondary | ICD-10-CM | POA: Diagnosis not present

## 2016-07-11 DIAGNOSIS — Z0000 Encounter for general adult medical examination without abnormal findings: Secondary | ICD-10-CM | POA: Diagnosis not present

## 2016-07-11 DIAGNOSIS — R7303 Prediabetes: Secondary | ICD-10-CM | POA: Diagnosis not present

## 2016-07-11 DIAGNOSIS — R972 Elevated prostate specific antigen [PSA]: Secondary | ICD-10-CM | POA: Diagnosis not present

## 2016-07-14 ENCOUNTER — Ambulatory Visit
Admission: RE | Admit: 2016-07-14 | Discharge: 2016-07-14 | Disposition: A | Discharge status: Home / Self Care | Payer: Medicare Other | Source: Ambulatory Visit | Attending: Internal Medicine | Admitting: Internal Medicine

## 2016-07-14 DIAGNOSIS — I639 Cerebral infarction, unspecified: Secondary | ICD-10-CM

## 2016-07-14 DIAGNOSIS — I6523 Occlusion and stenosis of bilateral carotid arteries: Secondary | ICD-10-CM | POA: Diagnosis not present

## 2016-07-19 DIAGNOSIS — H8110 Benign paroxysmal vertigo, unspecified ear: Secondary | ICD-10-CM | POA: Diagnosis not present

## 2016-07-19 DIAGNOSIS — J45909 Unspecified asthma, uncomplicated: Secondary | ICD-10-CM | POA: Diagnosis not present

## 2016-07-19 DIAGNOSIS — I639 Cerebral infarction, unspecified: Secondary | ICD-10-CM | POA: Diagnosis not present

## 2016-09-14 DIAGNOSIS — J069 Acute upper respiratory infection, unspecified: Secondary | ICD-10-CM | POA: Diagnosis not present

## 2016-11-24 DIAGNOSIS — H40003 Preglaucoma, unspecified, bilateral: Secondary | ICD-10-CM | POA: Diagnosis not present

## 2016-11-24 DIAGNOSIS — H353190 Nonexudative age-related macular degeneration, unspecified eye, stage unspecified: Secondary | ICD-10-CM | POA: Diagnosis not present

## 2016-11-24 DIAGNOSIS — H02831 Dermatochalasis of right upper eyelid: Secondary | ICD-10-CM | POA: Diagnosis not present

## 2016-11-24 DIAGNOSIS — H04123 Dry eye syndrome of bilateral lacrimal glands: Secondary | ICD-10-CM | POA: Diagnosis not present

## 2017-01-31 DIAGNOSIS — I639 Cerebral infarction, unspecified: Secondary | ICD-10-CM | POA: Diagnosis not present

## 2017-01-31 DIAGNOSIS — N400 Enlarged prostate without lower urinary tract symptoms: Secondary | ICD-10-CM | POA: Diagnosis not present

## 2017-01-31 DIAGNOSIS — R634 Abnormal weight loss: Secondary | ICD-10-CM | POA: Diagnosis not present

## 2017-01-31 DIAGNOSIS — D649 Anemia, unspecified: Secondary | ICD-10-CM | POA: Diagnosis not present

## 2017-03-15 DIAGNOSIS — H35363 Drusen (degenerative) of macula, bilateral: Secondary | ICD-10-CM | POA: Diagnosis not present

## 2017-06-26 DIAGNOSIS — H04203 Unspecified epiphora, bilateral lacrimal glands: Secondary | ICD-10-CM | POA: Diagnosis not present

## 2017-06-26 DIAGNOSIS — H04123 Dry eye syndrome of bilateral lacrimal glands: Secondary | ICD-10-CM | POA: Diagnosis not present

## 2017-06-26 DIAGNOSIS — H401131 Primary open-angle glaucoma, bilateral, mild stage: Secondary | ICD-10-CM | POA: Diagnosis not present

## 2017-07-10 DIAGNOSIS — Z8719 Personal history of other diseases of the digestive system: Secondary | ICD-10-CM

## 2017-07-10 HISTORY — DX: Personal history of other diseases of the digestive system: Z87.19

## 2017-07-16 DIAGNOSIS — Z1211 Encounter for screening for malignant neoplasm of colon: Secondary | ICD-10-CM | POA: Diagnosis not present

## 2017-07-16 DIAGNOSIS — E7800 Pure hypercholesterolemia, unspecified: Secondary | ICD-10-CM | POA: Diagnosis not present

## 2017-07-16 DIAGNOSIS — I639 Cerebral infarction, unspecified: Secondary | ICD-10-CM | POA: Diagnosis not present

## 2017-07-16 DIAGNOSIS — Z1389 Encounter for screening for other disorder: Secondary | ICD-10-CM | POA: Diagnosis not present

## 2017-07-16 DIAGNOSIS — J45909 Unspecified asthma, uncomplicated: Secondary | ICD-10-CM | POA: Diagnosis not present

## 2017-07-16 DIAGNOSIS — K911 Postgastric surgery syndromes: Secondary | ICD-10-CM | POA: Diagnosis not present

## 2017-07-16 DIAGNOSIS — R972 Elevated prostate specific antigen [PSA]: Secondary | ICD-10-CM | POA: Diagnosis not present

## 2017-07-16 DIAGNOSIS — R739 Hyperglycemia, unspecified: Secondary | ICD-10-CM | POA: Diagnosis not present

## 2017-07-16 DIAGNOSIS — K279 Peptic ulcer, site unspecified, unspecified as acute or chronic, without hemorrhage or perforation: Secondary | ICD-10-CM | POA: Diagnosis not present

## 2017-07-16 DIAGNOSIS — Z0000 Encounter for general adult medical examination without abnormal findings: Secondary | ICD-10-CM | POA: Diagnosis not present

## 2017-07-31 DIAGNOSIS — H02132 Senile ectropion of right lower eyelid: Secondary | ICD-10-CM | POA: Diagnosis not present

## 2017-07-31 DIAGNOSIS — H04203 Unspecified epiphora, bilateral lacrimal glands: Secondary | ICD-10-CM | POA: Diagnosis not present

## 2017-07-31 DIAGNOSIS — H02135 Senile ectropion of left lower eyelid: Secondary | ICD-10-CM | POA: Diagnosis not present

## 2017-08-21 DIAGNOSIS — L570 Actinic keratosis: Secondary | ICD-10-CM | POA: Diagnosis not present

## 2017-08-21 DIAGNOSIS — L309 Dermatitis, unspecified: Secondary | ICD-10-CM | POA: Diagnosis not present

## 2017-08-21 DIAGNOSIS — L821 Other seborrheic keratosis: Secondary | ICD-10-CM | POA: Diagnosis not present

## 2017-08-21 DIAGNOSIS — Z85828 Personal history of other malignant neoplasm of skin: Secondary | ICD-10-CM | POA: Diagnosis not present

## 2017-08-21 DIAGNOSIS — D225 Melanocytic nevi of trunk: Secondary | ICD-10-CM | POA: Diagnosis not present

## 2017-08-21 DIAGNOSIS — L218 Other seborrheic dermatitis: Secondary | ICD-10-CM | POA: Diagnosis not present

## 2017-12-26 DIAGNOSIS — M79605 Pain in left leg: Secondary | ICD-10-CM | POA: Diagnosis not present

## 2018-02-12 DIAGNOSIS — I639 Cerebral infarction, unspecified: Secondary | ICD-10-CM | POA: Diagnosis not present

## 2018-02-12 DIAGNOSIS — J45909 Unspecified asthma, uncomplicated: Secondary | ICD-10-CM | POA: Diagnosis not present

## 2018-02-12 DIAGNOSIS — R7303 Prediabetes: Secondary | ICD-10-CM | POA: Diagnosis not present

## 2018-02-13 DIAGNOSIS — H40003 Preglaucoma, unspecified, bilateral: Secondary | ICD-10-CM | POA: Diagnosis not present

## 2018-03-04 ENCOUNTER — Other Ambulatory Visit: Payer: Self-pay | Admitting: Internal Medicine

## 2018-03-04 ENCOUNTER — Ambulatory Visit
Admission: RE | Admit: 2018-03-04 | Discharge: 2018-03-04 | Disposition: A | Discharge status: Home / Self Care | Payer: Medicare Other | Source: Ambulatory Visit | Attending: Internal Medicine | Admitting: Internal Medicine

## 2018-03-04 DIAGNOSIS — K4090 Unilateral inguinal hernia, without obstruction or gangrene, not specified as recurrent: Secondary | ICD-10-CM | POA: Diagnosis not present

## 2018-03-04 DIAGNOSIS — K5900 Constipation, unspecified: Secondary | ICD-10-CM | POA: Diagnosis not present

## 2018-03-19 ENCOUNTER — Other Ambulatory Visit: Payer: Self-pay | Admitting: General Surgery

## 2018-03-19 DIAGNOSIS — Z8673 Personal history of transient ischemic attack (TIA), and cerebral infarction without residual deficits: Secondary | ICD-10-CM | POA: Diagnosis not present

## 2018-03-19 DIAGNOSIS — Z9889 Other specified postprocedural states: Secondary | ICD-10-CM | POA: Diagnosis not present

## 2018-03-19 DIAGNOSIS — N400 Enlarged prostate without lower urinary tract symptoms: Secondary | ICD-10-CM | POA: Diagnosis not present

## 2018-03-19 DIAGNOSIS — Z8719 Personal history of other diseases of the digestive system: Secondary | ICD-10-CM | POA: Diagnosis not present

## 2018-03-19 DIAGNOSIS — J45998 Other asthma: Secondary | ICD-10-CM | POA: Diagnosis not present

## 2018-03-19 DIAGNOSIS — H409 Unspecified glaucoma: Secondary | ICD-10-CM | POA: Diagnosis not present

## 2018-03-19 DIAGNOSIS — K4030 Unilateral inguinal hernia, with obstruction, without gangrene, not specified as recurrent: Secondary | ICD-10-CM | POA: Diagnosis not present

## 2018-03-30 NOTE — H&P (Signed)
Craig Vazquez  Location: [facility] Patient #: [id number] DOB: 03/12/1931 Married / Language: English / Race: White Male      History of Present Illness  The patient is a 84 year old male who presents with an inguinal hernia. This is a pleasant 84 year old man, referred by Dr. Wenda Low for evaluation of left inguinal hernia. He is a very pleasant gentleman, good insight, independent.  He underwent right inguinal hernia repair in 5427 without complications. He is a little bit vague but has noticed a bulge in the left groin which does not hurt him. No gastrointestinal symptoms. This was noted by his PCP and he was referred. A CT scan has been done that shows a left inguinal hernia containing fat and fluid. There maybe a couple of tiny hernias in his upper midline incision but very minimal. Biliary tract appears dilated. BPH is noted.  Past history significant for glaucoma, cerebrovascular accident 2014 without residual. Stable asthma. BPH. Peptic ulcer disease with history of pyloroplasty and vagotomy. Asymptomatic gallstones. Right inguinal hernia repair 1974. He takes an aspirin a day. Social history reveals that he is married. 2 children. Wife is in a wheelchair. Daughter helps with her care. He drives his own car. He is independent. Does his own yardwork. Quit smoking 1970. Drinks a beer or 2 a day.  We had a long talk about his hernia. I could not push it back in and it was a little bit tender when I tried. I advised him to have this repaired due to the fact that it is incarcerated and nonreducible. He agrees. He will be scheduled for open repair of left inguinal hernia with mesh. I discussed the indications, details, techniques, and he was risk of the surgery with him. He is aware of the risk of bleeding, infection, nerve damage with chronic pain, recurrence of the hernia, bruising and the penis and scrotum, injury to the testicle or bladder or  intestine with major reconstructive surgery. He understands all of these issues. All his questions were answered. He agrees with this plan.  I told him to stop his aspirin 2 days preop OPEV   Past Surgical History  Open Inguinal Hernia Surgery  Right. Vasectomy   Diagnostic Studies History Colonoscopy  1-5 years ago  Allergies  No Known Drug Allergies Allergies Reconciled   Medication History  Albuterol Sulfate HFA (108 (90 Base)MCG/ACT Aerosol Soln, Inhalation) Active. Aspirin (325MG  Tablet, Oral) Active. Carafate (1GM/10ML Suspension, Oral) Active. Flonase (50MCG/DOSE Inhaler, Nasal) Active. metFORMIN HCl (500MG  Tablet, Oral) Active. Flovent Diskus (100MCG/BLIST Aero Pow Br Act, Inhalation) Active. Pulmicort (Inhalation) Specific strength unknown - Active. Medications Reconciled   Social History  Alcohol use  Occasional alcohol use. Caffeine use  Coffee. No drug use  Tobacco use  Former smoker.  Family History Arthritis  Father, Mother.  Other Problems  Asthma  Atrial Fibrillation  Cerebrovascular Accident  Gastric Ulcer  Inguinal Hernia     Review of Systems General Not Present- Appetite Loss, Chills, Fatigue, Fever, Night Sweats, Weight Gain and Weight Loss. Skin Not Present- Change in Wart/Mole, Dryness, Hives, Jaundice, New Lesions, Non-Healing Wounds, Rash and Ulcer. HEENT Present- Seasonal Allergies. Not Present- Earache, Hearing Loss, Hoarseness, Nose Bleed, Oral Ulcers, Ringing in the Ears, Sinus Pain, Sore Throat, Visual Disturbances, Wears glasses/contact lenses and Yellow Eyes. Respiratory Present- Chronic Cough. Not Present- Bloody sputum, Difficulty Breathing, Snoring and Wheezing. Cardiovascular Not Present- Chest Pain, Difficulty Breathing Lying Down, Leg Cramps, Palpitations, Rapid Heart Rate, Shortness of  Breath and Swelling of Extremities. Gastrointestinal Not Present- Abdominal Pain, Bloating, Bloody Stool, Change in  Bowel Habits, Chronic diarrhea, Constipation, Difficulty Swallowing, Excessive gas, Gets full quickly at meals, Hemorrhoids, Indigestion, Nausea, Rectal Pain and Vomiting. Male Genitourinary Present- Change in Urinary Stream and Nocturia. Not Present- Blood in Urine, Frequency, Impotence, Painful Urination, Urgency and Urine Leakage.  Vitals  Weight: 152.4 lb Height: 70in Body Surface Area: 1.86 m Body Mass Index: 21.87 kg/m  Pain Level: 0/10 Temp.: 11F(Temporal)  Pulse: 72 (Regular)  P.OX: 96% (Room air) BP: 148/60 (Sitting, Left Arm, Standard)       Physical Exam  General Mental Status-Alert. General Appearance-Consistent with stated age. Hydration-Well hydrated. Voice-Normal. Note: Older gentleman. Pleasant. Alert. Good insight. No distress.   Head and Neck Head-normocephalic, atraumatic with no lesions or palpable masses. Trachea-midline. Thyroid Gland Characteristics - normal size and consistency.  Eye Eyeball - Bilateral-Extraocular movements intact. Sclera/Conjunctiva - Bilateral-No scleral icterus.  Chest and Lung Exam Chest and lung exam reveals -quiet, even and easy respiratory effort with no use of accessory muscles and on auscultation, normal breath sounds, no adventitious sounds and normal vocal resonance. Inspection Chest Wall - Normal. Back - normal.  Cardiovascular Cardiovascular examination reveals -normal heart sounds, regular rate and rhythm with no murmurs and normal pedal pulses bilaterally.  Abdomen Inspection Inspection of the abdomen reveals - No Hernias. Skin - Scar - Note: Upper midline scar well healed. There may be slight weakness of the umbilicus but nothing dramatic and no bulge. Palpation/Percussion Palpation and Percussion of the abdomen reveal - Soft, Non Tender, No Rebound tenderness, No Rigidity (guarding) and No hepatosplenomegaly. Auscultation Auscultation of the abdomen reveals - Bowel sounds  normal.  Male Genitourinary Note: Right groin reveals no hernia or mass. The scar from his right internal hernia is hard to see. In the left groin there is a golf ball size mass that I cannot reduce. Skin is healthy. Tissues are otherwise soft. Suspect incarcerated inguinal hernia with fat.   Neurologic Neurologic evaluation reveals -alert and oriented x 3 with no impairment of recent or remote memory. Mental Status-Normal.  Musculoskeletal Normal Exam - Left-Upper Extremity Strength Normal and Lower Extremity Strength Normal. Normal Exam - Right-Upper Extremity Strength Normal and Lower Extremity Strength Normal.  Lymphatic Head & Neck  General Head & Neck Lymphatics: Bilateral - Description - Normal. Axillary  General Axillary Region: Bilateral - Description - Normal. Tenderness - Non Tender. Femoral & Inguinal  Generalized Femoral & Inguinal Lymphatics: Bilateral - Description  - Normal. Tenderness - Non Tender.    Assessment & Plan  INGUINAL HERNIA OF LEFT SIDE WITH OBSTRUCTION AND WITHOUT GANGRENE (K40.30)    You have a left inguinal hernia I cannot push this back in which means it is incarcerated your CT scan shows the hernia on the left side with fat and fluid in the hernia. The intestine does not appear to be involved You have been advised to have this repaired surgically to avoid future complications and you agree  you will be scheduled for open repair of left inguinal hernia with mesh I have discussed the indications, techniques, and risks of this surgery in detail He will be able to drive her car in about 10 days No sports, heavy lifting or strenuous activities for 6 weeks  you will be admitted to the hospital overnight for observation and hopefully go home the next day Your daughter will need to help you around the house since your wife is in a wheelchair.  HISTORY OF CVA IN ADULTHOOD (I37.04) Impression: 2014. No residual. HISTORY OF RIGHT INGUINAL  HERNIA REPAIR (Z98.890) Impression: 1974 HISTORY OF GASTRIC ULCER (Z87.19) Impression: History vagotomy and pyloroplasty ASTHMA IN REMISSION (J45.998) GLAUCOMA (H40.9) BPH (BENIGN PROSTATIC HYPERPLASIA) (N40.0)    Retina Bernardy M. Dalbert Batman, M.D., [facility] General and Minimally invasive Surgery Breast and Colorectal Surgery Office:   [phone] Pager:   [phone]

## 2018-04-01 ENCOUNTER — Encounter (HOSPITAL_COMMUNITY): Payer: Self-pay

## 2018-04-01 NOTE — Pre-Procedure Instructions (Addendum)
Craig Vazquez  04/01/2018      [id number] Lorina Rabon, [place] [address] [place] [address] Phone: [phone] Fax: [phone]    Your procedure is scheduled on Friday September 27.  Report to [facility] Admitting at 9:30 A.M.  Call this number if you have problems the morning of surgery:  [phone]   Remember:  Do not eat or drink after midnight.  You may drink clear liquids until 8:30 AM (3 hours prior to surgery) .  Clear liquids allowed are:   Water, Juice (non-citric and without pulp), Black Coffee only and Gatorade    Take these medicines the morning of surgery with A SIP OF WATER:   Albuterol if needed (please bring inhaler to hospital with you) Flonase if needed  DO NOT take Metformin (Glucophage) the day of surgery  7 days prior to surgery STOP taking any Aspirin(unless otherwise instructed by your surgeon), Aleve, Naproxen, Ibuprofen, Motrin, Advil, Goody's, BC's, all herbal medications, fish oil, and all vitamins     How to Manage Your Diabetes Before and After Surgery  Why is it important to control my blood sugar before and after surgery? . Improving blood sugar levels before and after surgery helps healing and can limit problems. . A way of improving blood sugar control is eating a healthy diet by: o  Eating less sugar and carbohydrates o  Increasing activity/exercise o  Talking with your doctor about reaching your blood sugar goals . High blood sugars (greater than 180 mg/dL) can raise your risk of infections and slow your recovery, so you will need to focus on controlling your diabetes during the weeks before surgery. . Make sure that the doctor who takes care of your diabetes knows about your planned surgery including the date and location.  How do I manage my blood sugar before surgery? . Check your blood sugar at least 4 times a day,  starting 2 days before surgery, to make sure that the level is not too high or low. o Check your blood sugar the morning of your surgery when you wake up and every 2 hours until you get to the Short Stay unit. . If your blood sugar is less than 70 mg/dL, you will need to treat for low blood sugar: o Do not take insulin. o Treat a low blood sugar (less than 70 mg/dL) with  cup of clear juice (cranberry or apple), 4 glucose tablets, OR glucose gel. Recheck blood sugar in 15 minutes after treatment (to make sure it is greater than 70 mg/dL). If your blood sugar is not greater than 70 mg/dL on recheck, call [phone] o  for further instructions. . Report your blood sugar to the short stay nurse when you get to Short Stay.  . If you are admitted to the hospital after surgery: o Your blood sugar will be checked by the staff and you will probably be given insulin after surgery (instead of oral diabetes medicines) to make sure you have good blood sugar levels. o The goal for blood sugar control after surgery is 80-180 mg/dL.                Do not wear jewelry  Do not wear lotions, powders, or colognes, or deodorant.  Do not shave 48 hours prior to surgery.  Men may shave face and neck.  Do not bring valuables to  the hospital.  [facility] is not responsible for any belongings or valuables.  Contacts, dentures or bridgework may not be worn into surgery.  Leave your suitcase in the car.  After surgery it may be brought to your room.  For patients admitted to the hospital, discharge time will be determined by your treatment team.  Patients discharged the day of surgery will not be allowed to drive home.   Special instructions:    [place]- Preparing For Surgery  Before surgery, you can play an important role. Because skin is not sterile, your skin needs to be as free of germs as possible. You can reduce the number of germs on your skin by washing with CHG (chlorahexidine  gluconate) Soap before surgery.  CHG is an antiseptic cleaner which kills germs and bonds with the skin to continue killing germs even after washing.    Oral Hygiene is also important to reduce your risk of infection.  Remember - BRUSH YOUR TEETH THE MORNING OF SURGERY WITH YOUR REGULAR TOOTHPASTE  Please do not use if you have an allergy to CHG or antibacterial soaps. If your skin becomes reddened/irritated stop using the CHG.  Do not shave (including legs and underarms) for at least 48 hours prior to first CHG shower. It is OK to shave your face.  Please follow these instructions carefully.   1. Shower the NIGHT BEFORE SURGERY and the MORNING OF SURGERY with CHG.   2. If you chose to wash your hair, wash your hair first as usual with your normal shampoo.  3. After you shampoo, rinse your hair and body thoroughly to remove the shampoo.  4. Use CHG as you would any other liquid soap. You can apply CHG directly to the skin and wash gently with a scrungie or a clean washcloth.   5. Apply the CHG Soap to your body ONLY FROM THE NECK DOWN.  Do not use on open wounds or open sores. Avoid contact with your eyes, ears, mouth and genitals (private parts). Wash Face and genitals (private parts)  with your normal soap.  6. Wash thoroughly, paying special attention to the area where your surgery will be performed.  7. Thoroughly rinse your body with warm water from the neck down.  8. DO NOT shower/wash with your normal soap after using and rinsing off the CHG Soap.  9. Pat yourself dry with a CLEAN TOWEL.  10. Wear CLEAN PAJAMAS to bed the night before surgery, wear comfortable clothes the morning of surgery  11. Place CLEAN SHEETS on your bed the night of your first shower and DO NOT SLEEP WITH PETS.    Day of Surgery:  Do not apply any deodorants/lotions.  Please wear clean clothes to the hospital/surgery center.   Remember to brush your teeth WITH YOUR REGULAR TOOTHPASTE.    Please  read over the following fact sheets that you were given. Coughing and Deep Breathing and Surgical Site Infection Prevention

## 2018-04-02 ENCOUNTER — Other Ambulatory Visit: Payer: Self-pay

## 2018-04-02 ENCOUNTER — Encounter (HOSPITAL_COMMUNITY)
Admission: RE | Admit: 2018-04-02 | Discharge: 2018-04-02 | Disposition: A | Discharge status: Home / Self Care | Payer: Medicare Other | Source: Ambulatory Visit | Attending: General Surgery | Admitting: General Surgery

## 2018-04-02 ENCOUNTER — Encounter (HOSPITAL_COMMUNITY): Payer: Self-pay

## 2018-04-02 DIAGNOSIS — I4891 Unspecified atrial fibrillation: Secondary | ICD-10-CM | POA: Diagnosis not present

## 2018-04-02 DIAGNOSIS — H409 Unspecified glaucoma: Secondary | ICD-10-CM | POA: Diagnosis not present

## 2018-04-02 DIAGNOSIS — I491 Atrial premature depolarization: Secondary | ICD-10-CM | POA: Diagnosis not present

## 2018-04-02 DIAGNOSIS — Z01818 Encounter for other preprocedural examination: Secondary | ICD-10-CM

## 2018-04-02 DIAGNOSIS — K4031 Unilateral inguinal hernia, with obstruction, without gangrene, recurrent: Secondary | ICD-10-CM | POA: Diagnosis not present

## 2018-04-02 DIAGNOSIS — I4510 Unspecified right bundle-branch block: Secondary | ICD-10-CM | POA: Diagnosis not present

## 2018-04-02 DIAGNOSIS — Z79899 Other long term (current) drug therapy: Secondary | ICD-10-CM

## 2018-04-02 DIAGNOSIS — K4090 Unilateral inguinal hernia, without obstruction or gangrene, not specified as recurrent: Secondary | ICD-10-CM

## 2018-04-02 DIAGNOSIS — Z8673 Personal history of transient ischemic attack (TIA), and cerebral infarction without residual deficits: Secondary | ICD-10-CM | POA: Insufficient documentation

## 2018-04-02 DIAGNOSIS — Z7982 Long term (current) use of aspirin: Secondary | ICD-10-CM | POA: Diagnosis not present

## 2018-04-02 DIAGNOSIS — J45909 Unspecified asthma, uncomplicated: Secondary | ICD-10-CM | POA: Diagnosis not present

## 2018-04-02 DIAGNOSIS — Z8719 Personal history of other diseases of the digestive system: Secondary | ICD-10-CM | POA: Diagnosis not present

## 2018-04-02 DIAGNOSIS — Z7984 Long term (current) use of oral hypoglycemic drugs: Secondary | ICD-10-CM

## 2018-04-02 DIAGNOSIS — K4030 Unilateral inguinal hernia, with obstruction, without gangrene, not specified as recurrent: Secondary | ICD-10-CM | POA: Diagnosis present

## 2018-04-02 DIAGNOSIS — Z23 Encounter for immunization: Secondary | ICD-10-CM | POA: Diagnosis not present

## 2018-04-02 DIAGNOSIS — E119 Type 2 diabetes mellitus without complications: Secondary | ICD-10-CM | POA: Diagnosis not present

## 2018-04-02 DIAGNOSIS — N400 Enlarged prostate without lower urinary tract symptoms: Secondary | ICD-10-CM | POA: Diagnosis not present

## 2018-04-02 DIAGNOSIS — Z8711 Personal history of peptic ulcer disease: Secondary | ICD-10-CM | POA: Diagnosis not present

## 2018-04-02 DIAGNOSIS — Z87891 Personal history of nicotine dependence: Secondary | ICD-10-CM | POA: Diagnosis not present

## 2018-04-02 HISTORY — DX: Type 2 diabetes mellitus without complications: E11.9

## 2018-04-02 LAB — COMPREHENSIVE METABOLIC PANEL
ALT: 12 U/L (ref 0–44)
AST: 22 U/L (ref 15–41)
Albumin: 4.1 g/dL (ref 3.5–5.0)
Alkaline Phosphatase: 46 U/L (ref 38–126)
Anion gap: 12 (ref 5–15)
BUN: 9 mg/dL (ref 8–23)
CO2: 23 mmol/L (ref 22–32)
Calcium: 9.1 mg/dL (ref 8.9–10.3)
Chloride: 98 mmol/L (ref 98–111)
Creatinine, Ser: 1.21 mg/dL (ref 0.61–1.24)
GFR calc Af Amer: >60 mL/min (ref >60)
GFR calc non Af Amer: 53 mL/min — ABNORMAL LOW (ref >60)
Glucose, Bld: 94 mg/dL (ref 70–99)
Potassium: 3.9 mmol/L (ref 3.5–5.1)
Sodium: 133 mmol/L — ABNORMAL LOW (ref 135–145)
Total Bilirubin: 0.6 mg/dL (ref 0.3–1.2)
Total Protein: 7.4 g/dL (ref 6.5–8.1)

## 2018-04-02 LAB — HEMOGLOBIN A1C
Hgb A1c MFr Bld: 6.5 % — ABNORMAL HIGH (ref 4.8–5.6)
Mean Plasma Glucose: 139.85 mg/dL

## 2018-04-02 LAB — CBC WITH DIFFERENTIAL/PLATELET
Abs Immature Granulocytes: 0 10*3/uL (ref 0.0–0.1)
Basophils Absolute: 0 10*3/uL (ref 0.0–0.1)
Basophils Relative: 1 %
Eosinophils Absolute: 0.2 10*3/uL (ref 0.0–0.7)
Eosinophils Relative: 4 %
HCT: 29.4 % — ABNORMAL LOW (ref 39.0–52.0)
Hemoglobin: 8.7 g/dL — ABNORMAL LOW (ref 13.0–17.0)
Immature Granulocytes: 0 %
Lymphocytes Relative: 19 %
Lymphs Abs: 1.2 10*3/uL (ref 0.7–4.0)
MCH: 23.8 pg — ABNORMAL LOW (ref 26.0–34.0)
MCHC: 29.6 g/dL — ABNORMAL LOW (ref 30.0–36.0)
MCV: 80.5 fL (ref 78.0–100.0)
Monocytes Absolute: 0.5 10*3/uL (ref 0.1–1.0)
Monocytes Relative: 7 %
Neutro Abs: 4.6 10*3/uL (ref 1.7–7.7)
Neutrophils Relative %: 69 %
Platelets: 279 10*3/uL (ref 150–400)
RBC: 3.65 MIL/uL — ABNORMAL LOW (ref 4.22–5.81)
RDW: 15.8 % — ABNORMAL HIGH (ref 11.5–15.5)
WBC: 6.6 10*3/uL (ref 4.0–10.5)

## 2018-04-02 NOTE — Progress Notes (Signed)
Notified Sharyn Lull, RN at Dr. Darrel Hoover office regarding patient's HGB being 8.7. Sharyn Lull stated that she would be making Dr. Dalbert Batman aware.

## 2018-04-02 NOTE — Progress Notes (Signed)
PCP - Dr. Wenda Low Cardiologist - denies  Chest x-ray - N/A EKG - 04/02/18 Stress Test -denies  ECHO - 01/16/13 Cardiac Cath - denies  Sleep Study - denies  Pt stated he just started taking Metformin a few months ago. Pt asked "what is this medication even for. I stopped taking it a few days ago because I ran out." When asked about being diabetic patient stated "I'm not diabetic". Pt educated on the medication and its importance. Pt stated he will refill prescription. Will obtain a A1C and EKG per anesthesia protocol.   Aspirin Instructions: Per Dr. Dalbert Batman, pt is to hold ASA for 3-4 days prior to surgery. Pt's LD was 03/31/18.   Anesthesia review: Yes, EKG.   Patient denies shortness of breath, fever, cough and chest pain at PAT appointment   Patient verbalized understanding of instructions that were given to them at the PAT appointment. Patient was also instructed that they will need to review over the PAT instructions again at home before surgery.

## 2018-04-03 NOTE — Progress Notes (Addendum)
Anesthesia Chart Review:  Case:  [id number] Date/Time:  04/05/18 1115   Procedures:      OPEN REPAIR OF LEFT INGUINAL HERNIA (Left )     INSERTION OF MESH (Left )   Anesthesia type:  General   Pre-op diagnosis:  INCARCERATED INGUINAL HERNIA   Location:  [place] OR ROOM 02 / [place] OR   Surgeon:  Fanny Skates, MD      DISCUSSION: 84 yo male former smoker. Pertinent hx includes Asthma, Stroke (2014), DMII, Vertigo.  Pt suffered stroke in 2014 without residual. TEE at that time showed normal systolic funciton with EF 60-65%, patent foramen ovale. No significant carotid disease. Maintained on ASA 325mg  and followed by Dr. Leonie Man.  Per surgery posting he is to stop ASA 2d preop.  Hgb 8.7 on preop labs, Dr. Dalbert Batman notified.   Anticipate he can proceed as planned barring acute status change.   VS: BP (!) 141/57   Pulse 70   Temp 36.7 C   Resp 18   Ht 5\' 10"  (1.778 m)   Wt 68.3 kg   SpO2 99%   BMI 21.59 kg/m   PROVIDERS: Wenda Low, MD is PCP   LABS: Labs reviewed: Acceptable for surgery. Dr. Dalbert Batman notified of hgb 8.7 (all labs ordered are listed, but only abnormal results are displayed)  Labs Reviewed  CBC WITH DIFFERENTIAL/PLATELET - Abnormal; Notable for the following components:      Result Value   RBC 3.65 (*)    Hemoglobin 8.7 (*)    HCT 29.4 (*)    MCH 23.8 (*)    MCHC 29.6 (*)    RDW 15.8 (*)    All other components within normal limits  COMPREHENSIVE METABOLIC PANEL - Abnormal; Notable for the following components:   Sodium 133 (*)    GFR calc non Af Amer 53 (*)    All other components within normal limits  HEMOGLOBIN A1C - Abnormal; Notable for the following components:   Hgb A1c MFr Bld 6.5 (*)    All other components within normal limits     IMAGES: CT Abd/pelvis 03/04/18: IMPRESSION: 1. Left inguinal hernia contains fat and fluid. 2. Additional small hernias in the supraumbilical abdominal midline and right groin. 3. Intrahepatic and extrahepatic  biliary ductal dilatation without cause identified. Please correlate for right upper quadrant symptoms. 4. Aortic atherosclerosis (ICD10-170.0). Coronary artery calcification. 5. Stool throughout the colon is indicative of constipation. 6. Enlarged prostate.  EKG: 04/02/2018: Sinus rhythm with Premature atrial complexes. Right bundle branch block. RBBB new since 2014  CV: Carotid US 07/14/2016: RIGHT CAROTID ARTERY: No significant calcified disease of the right common carotid artery. Intermediate waveform maintained. Mild homogeneous plaque without significant calcifications at the right carotid bifurcation. Low resistance waveform of the right ICA. No significant tortuosity.  RIGHT VERTEBRAL ARTERY: Antegrade flow with low resistance waveform.  LEFT CAROTID ARTERY: No significant calcified disease of the left common carotid artery. Intermediate waveform maintained. Mild homogeneous plaque at the left carotid bifurcation without significant calcifications. Low resistance waveform of the left ICA.  LEFT VERTEBRAL ARTERY:  Antegrade flow with low resistance waveform.  IMPRESSION: Color duplex indicates minimal homogeneous plaque, with no hemodynamically significant stenosis by duplex criteria in the extracranial cerebrovascular circulation.  TEE 01/16/2013: Study Conclusions  - Left ventricle: Systolic function was normal. The estimated ejection fraction was in the range of 60% to 65%. - Aortic valve: No evidence of vegetation. - Mitral valve: No evidence of vegetation. - Left atrium: No evidence  of thrombus in the atrial cavity or appendage. - Atrial septum: There was a patent foramen ovale.  Past Medical History:  Diagnosis Date  . Asthma   . Diabetes mellitus without complication ([place])   . Hypoglycemia   . Stroke ([place])    5.1.14  . Vertigo     Past Surgical History:  Procedure Laterality Date  . APPENDECTOMY    . CHOLECYSTECTOMY    . HAND SURGERY      rt hand  . herina    . HERNIA REPAIR    . LAPAROSCOPIC GASTROTOMY W/ REPAIR OF ULCER  1970  . MOHS SURGERY  2017  . TEE WITHOUT CARDIOVERSION N/A 01/16/2013   Procedure: TRANSESOPHAGEAL ECHOCARDIOGRAM (TEE);  Surgeon: Thayer Headings, MD;  Location: [facility] ENDOSCOPY;  Service: Cardiovascular;  Laterality: N/A;  . TONSILLECTOMY      MEDICATIONS: . albuterol (PROVENTIL HFA;VENTOLIN HFA) 108 (90 Base) MCG/ACT inhaler  . aspirin 325 MG tablet  . fluticasone (FLONASE) 50 MCG/ACT nasal spray  . Fluticasone Propionate, Inhal, (FLOVENT DISKUS) 100 MCG/BLIST AEPB  . metFORMIN (GLUCOPHAGE) 500 MG tablet  . sucralfate (CARAFATE) 1 G tablet  . sucralfate (CARAFATE) 1 GM/10ML suspension   No current facility-administered medications for this encounter.     Wynonia Musty [facility]/Anesthesiology Phone [phone] 04/03/2018 11:30 AM

## 2018-04-05 ENCOUNTER — Encounter (HOSPITAL_COMMUNITY): Payer: Self-pay

## 2018-04-05 ENCOUNTER — Other Ambulatory Visit: Payer: Self-pay

## 2018-04-05 ENCOUNTER — Ambulatory Visit (HOSPITAL_COMMUNITY): Payer: Medicare Other | Admitting: Anesthesiology

## 2018-04-05 ENCOUNTER — Ambulatory Visit (HOSPITAL_COMMUNITY): Payer: Medicare Other | Admitting: Physician Assistant

## 2018-04-05 ENCOUNTER — Ambulatory Visit (HOSPITAL_COMMUNITY)
Admission: RE | Admit: 2018-04-05 | Discharge: 2018-04-06 | Disposition: A | Discharge status: Home / Self Care | Payer: Medicare Other | Source: Ambulatory Visit | Attending: General Surgery | Admitting: General Surgery

## 2018-04-05 ENCOUNTER — Encounter (HOSPITAL_COMMUNITY)
Admission: RE | Disposition: A | Discharge status: Home / Self Care | Payer: Self-pay | Source: Ambulatory Visit | Attending: General Surgery

## 2018-04-05 DIAGNOSIS — J45909 Unspecified asthma, uncomplicated: Secondary | ICD-10-CM | POA: Diagnosis not present

## 2018-04-05 DIAGNOSIS — Z7982 Long term (current) use of aspirin: Secondary | ICD-10-CM | POA: Diagnosis not present

## 2018-04-05 DIAGNOSIS — Z8673 Personal history of transient ischemic attack (TIA), and cerebral infarction without residual deficits: Secondary | ICD-10-CM | POA: Insufficient documentation

## 2018-04-05 DIAGNOSIS — E119 Type 2 diabetes mellitus without complications: Secondary | ICD-10-CM | POA: Diagnosis not present

## 2018-04-05 DIAGNOSIS — K4031 Unilateral inguinal hernia, with obstruction, without gangrene, recurrent: Secondary | ICD-10-CM | POA: Diagnosis present

## 2018-04-05 DIAGNOSIS — H409 Unspecified glaucoma: Secondary | ICD-10-CM | POA: Insufficient documentation

## 2018-04-05 DIAGNOSIS — I491 Atrial premature depolarization: Secondary | ICD-10-CM | POA: Diagnosis not present

## 2018-04-05 DIAGNOSIS — I4510 Unspecified right bundle-branch block: Secondary | ICD-10-CM | POA: Insufficient documentation

## 2018-04-05 DIAGNOSIS — N400 Enlarged prostate without lower urinary tract symptoms: Secondary | ICD-10-CM | POA: Diagnosis not present

## 2018-04-05 DIAGNOSIS — G8918 Other acute postprocedural pain: Secondary | ICD-10-CM | POA: Diagnosis not present

## 2018-04-05 DIAGNOSIS — Z8711 Personal history of peptic ulcer disease: Secondary | ICD-10-CM | POA: Diagnosis not present

## 2018-04-05 DIAGNOSIS — Z87891 Personal history of nicotine dependence: Secondary | ICD-10-CM | POA: Diagnosis not present

## 2018-04-05 DIAGNOSIS — Z79899 Other long term (current) drug therapy: Secondary | ICD-10-CM | POA: Insufficient documentation

## 2018-04-05 DIAGNOSIS — Z8719 Personal history of other diseases of the digestive system: Secondary | ICD-10-CM | POA: Insufficient documentation

## 2018-04-05 DIAGNOSIS — Z23 Encounter for immunization: Secondary | ICD-10-CM | POA: Diagnosis not present

## 2018-04-05 DIAGNOSIS — Z7984 Long term (current) use of oral hypoglycemic drugs: Secondary | ICD-10-CM | POA: Insufficient documentation

## 2018-04-05 DIAGNOSIS — I4891 Unspecified atrial fibrillation: Secondary | ICD-10-CM | POA: Insufficient documentation

## 2018-04-05 HISTORY — PX: INGUINAL HERNIA REPAIR: SHX194

## 2018-04-05 HISTORY — DX: Unilateral inguinal hernia, with obstruction, without gangrene, recurrent: K40.31

## 2018-04-05 HISTORY — PX: INSERTION OF MESH: SHX5868

## 2018-04-05 LAB — CBC
HCT: 28.4 % — ABNORMAL LOW (ref 39.0–52.0)
Hemoglobin: 8.6 g/dL — ABNORMAL LOW (ref 13.0–17.0)
MCH: 23.6 pg — ABNORMAL LOW (ref 26.0–34.0)
MCHC: 30.3 g/dL (ref 30.0–36.0)
MCV: 77.8 fL — ABNORMAL LOW (ref 78.0–100.0)
Platelets: 279 10*3/uL (ref 150–400)
RBC: 3.65 MIL/uL — ABNORMAL LOW (ref 4.22–5.81)
RDW: 15.8 % — ABNORMAL HIGH (ref 11.5–15.5)
WBC: 10.1 10*3/uL (ref 4.0–10.5)

## 2018-04-05 LAB — CREATININE, SERUM
Creatinine, Ser: 1.11 mg/dL (ref 0.61–1.24)
GFR calc Af Amer: >60 mL/min (ref >60)
GFR calc non Af Amer: 59 mL/min — ABNORMAL LOW (ref >60)

## 2018-04-05 LAB — GLUCOSE, CAPILLARY
Glucose-Capillary: 105 mg/dL — ABNORMAL HIGH (ref 70–99)
Glucose-Capillary: 101 mg/dL — ABNORMAL HIGH (ref 70–99)
Glucose-Capillary: 107 mg/dL — ABNORMAL HIGH (ref 70–99)

## 2018-04-05 SURGERY — REPAIR, HERNIA, INGUINAL, ADULT
Anesthesia: Regional | Laterality: Left

## 2018-04-05 MED ORDER — SUCRALFATE 1 GM/10ML PO SUSP: 1.0000 g | Freq: Two times a day (BID) | ORAL | Status: DC | PRN

## 2018-04-05 MED ORDER — HYDROMORPHONE HCL 1 MG/ML IJ SOLN: 0.5000 mg | INTRAMUSCULAR | Status: DC | PRN

## 2018-04-05 MED ORDER — ALBUTEROL SULFATE (2.5 MG/3ML) 0.083% IN NEBU: 2.5000 mL | INHALATION_SOLUTION | RESPIRATORY_TRACT | Status: DC | PRN

## 2018-04-05 MED ORDER — PANTOPRAZOLE SODIUM 40 MG PO TBEC
40.0000 mg | DELAYED_RELEASE_TABLET | Freq: Every day | ORAL | Status: DC
  Administered 2018-04-05 – 2018-04-06 (×2): 40 mg via ORAL
  Filled 2018-04-05 (×2): qty 1

## 2018-04-05 MED ORDER — BUPIVACAINE-EPINEPHRINE (PF) 0.25% -1:200000 IJ SOLN
INTRAMUSCULAR | Status: AC
  Filled 2018-04-05: qty 30

## 2018-04-05 MED ORDER — ONDANSETRON HCL 4 MG/2ML IJ SOLN: 4.0000 mg | Freq: Once | INTRAMUSCULAR | Status: DC | PRN

## 2018-04-05 MED ORDER — ACETAMINOPHEN 500 MG PO TABS
1000.0000 mg | ORAL_TABLET | ORAL | Status: AC
  Administered 2018-04-05: 1000 mg via ORAL
  Filled 2018-04-05: qty 2

## 2018-04-05 MED ORDER — SODIUM CHLORIDE 0.9 % IV SOLN
INTRAVENOUS | Status: DC | PRN
  Administered 2018-04-05: 20 ug/min via INTRAVENOUS

## 2018-04-05 MED ORDER — SUCRALFATE 1 G PO TABS
1.0000 g | ORAL_TABLET | Freq: Two times a day (BID) | ORAL | Status: DC
  Administered 2018-04-05 – 2018-04-06 (×2): 1 g via ORAL
  Filled 2018-04-05 (×3): qty 1

## 2018-04-05 MED ORDER — DEXAMETHASONE SODIUM PHOSPHATE 10 MG/ML IJ SOLN
INTRAMUSCULAR | Status: DC | PRN
  Administered 2018-04-05: 10 mg via INTRAVENOUS

## 2018-04-05 MED ORDER — ROCURONIUM BROMIDE 10 MG/ML (PF) SYRINGE
PREFILLED_SYRINGE | INTRAVENOUS | Status: DC | PRN
  Administered 2018-04-05: 40 mg via INTRAVENOUS
  Administered 2018-04-05: 10 mg via INTRAVENOUS

## 2018-04-05 MED ORDER — LACTATED RINGERS IV SOLN
INTRAVENOUS | Status: DC
  Administered 2018-04-05 (×3): via INTRAVENOUS

## 2018-04-05 MED ORDER — BUDESONIDE 0.25 MG/2ML IN SUSP
0.2500 mg | Freq: Two times a day (BID) | RESPIRATORY_TRACT | Status: DC
  Administered 2018-04-05 – 2018-04-06 (×2): 0.25 mg via RESPIRATORY_TRACT
  Filled 2018-04-05 (×2): qty 2

## 2018-04-05 MED ORDER — BUPIVACAINE LIPOSOME 1.3 % IJ SUSP
20.0000 mL | Freq: Once | INTRAMUSCULAR | Status: DC
  Filled 2018-04-05: qty 20

## 2018-04-05 MED ORDER — FENTANYL CITRATE (PF) 100 MCG/2ML IJ SOLN: 25.0000 ug | INTRAMUSCULAR | Status: DC | PRN

## 2018-04-05 MED ORDER — PROPOFOL 10 MG/ML IV BOLUS
INTRAVENOUS | Status: AC
  Filled 2018-04-05: qty 20

## 2018-04-05 MED ORDER — PROPOFOL 10 MG/ML IV BOLUS
INTRAVENOUS | Status: DC | PRN
  Administered 2018-04-05: 110 mg via INTRAVENOUS

## 2018-04-05 MED ORDER — OXYCODONE-ACETAMINOPHEN 5-325 MG PO TABS: 1.0000 | ORAL_TABLET | ORAL | Status: DC | PRN

## 2018-04-05 MED ORDER — FENTANYL CITRATE (PF) 100 MCG/2ML IJ SOLN
INTRAMUSCULAR | Status: AC
  Administered 2018-04-05: 50 ug
  Filled 2018-04-05: qty 2

## 2018-04-05 MED ORDER — ONDANSETRON HCL 4 MG/2ML IJ SOLN
INTRAMUSCULAR | Status: DC | PRN
  Administered 2018-04-05: 4 mg via INTRAVENOUS

## 2018-04-05 MED ORDER — METFORMIN HCL 500 MG PO TABS
500.0000 mg | ORAL_TABLET | Freq: Every day | ORAL | Status: DC
  Administered 2018-04-06: 500 mg via ORAL
  Filled 2018-04-05: qty 1

## 2018-04-05 MED ORDER — LACTATED RINGERS IV SOLN
INTRAVENOUS | Status: DC
  Administered 2018-04-06: 03:00:00 via INTRAVENOUS

## 2018-04-05 MED ORDER — SODIUM CHLORIDE 0.9 % IV SOLN
INTRAVENOUS | Status: DC | PRN
  Administered 2018-04-05: 24 mL

## 2018-04-05 MED ORDER — CEFAZOLIN SODIUM-DEXTROSE 2-4 GM/100ML-% IV SOLN
2.0000 g | INTRAVENOUS | Status: AC
  Administered 2018-04-05: 2 g via INTRAVENOUS
  Filled 2018-04-05: qty 100

## 2018-04-05 MED ORDER — ONDANSETRON HCL 4 MG/2ML IJ SOLN: 4.0000 mg | Freq: Four times a day (QID) | INTRAMUSCULAR | Status: DC | PRN

## 2018-04-05 MED ORDER — LIDOCAINE 2% (20 MG/ML) 5 ML SYRINGE
INTRAMUSCULAR | Status: DC | PRN
  Administered 2018-04-05: 60 mg via INTRAVENOUS

## 2018-04-05 MED ORDER — FENTANYL CITRATE (PF) 100 MCG/2ML IJ SOLN
50.0000 ug | Freq: Once | INTRAMUSCULAR | Status: AC
  Administered 2018-04-05: 100 ug via INTRAVENOUS

## 2018-04-05 MED ORDER — TRAMADOL HCL 50 MG PO TABS: 50.0000 mg | ORAL_TABLET | Freq: Four times a day (QID) | ORAL | Status: DC | PRN

## 2018-04-05 MED ORDER — KETOROLAC TROMETHAMINE 30 MG/ML IJ SOLN
INTRAMUSCULAR | Status: AC
  Filled 2018-04-05: qty 1

## 2018-04-05 MED ORDER — ONDANSETRON 4 MG PO TBDP: 4.0000 mg | ORAL_TABLET | Freq: Four times a day (QID) | ORAL | Status: DC | PRN

## 2018-04-05 MED ORDER — CHLORHEXIDINE GLUCONATE CLOTH 2 % EX PADS: 6.0000 | MEDICATED_PAD | Freq: Once | CUTANEOUS | Status: DC

## 2018-04-05 MED ORDER — ROPIVACAINE HCL 5 MG/ML IJ SOLN
INTRAMUSCULAR | Status: DC | PRN
  Administered 2018-04-05: 30 mL via PERINEURAL

## 2018-04-05 MED ORDER — MIDAZOLAM HCL 2 MG/2ML IJ SOLN
INTRAMUSCULAR | Status: AC
  Filled 2018-04-05: qty 2

## 2018-04-05 MED ORDER — ENOXAPARIN SODIUM 40 MG/0.4ML ~~LOC~~ SOLN
40.0000 mg | SUBCUTANEOUS | Status: DC
  Administered 2018-04-06: 40 mg via SUBCUTANEOUS
  Filled 2018-04-05: qty 0.4

## 2018-04-05 MED ORDER — SUGAMMADEX SODIUM 200 MG/2ML IV SOLN
INTRAVENOUS | Status: DC | PRN
  Administered 2018-04-05: 200 mg via INTRAVENOUS

## 2018-04-05 MED ORDER — GABAPENTIN 300 MG PO CAPS
300.0000 mg | ORAL_CAPSULE | ORAL | Status: AC
  Administered 2018-04-05: 300 mg via ORAL
  Filled 2018-04-05: qty 1

## 2018-04-05 MED ORDER — SENNA 8.6 MG PO TABS
1.0000 | ORAL_TABLET | Freq: Two times a day (BID) | ORAL | Status: DC
  Administered 2018-04-05 – 2018-04-06 (×3): 8.6 mg via ORAL
  Filled 2018-04-05 (×3): qty 1

## 2018-04-05 MED ORDER — INFLUENZA VAC SPLIT HIGH-DOSE 0.5 ML IM SUSY
0.5000 mL | PREFILLED_SYRINGE | INTRAMUSCULAR | Status: AC
  Administered 2018-04-06: 0.5 mL via INTRAMUSCULAR
  Filled 2018-04-05: qty 0.5

## 2018-04-05 MED ORDER — FENTANYL CITRATE (PF) 250 MCG/5ML IJ SOLN
INTRAMUSCULAR | Status: AC
  Filled 2018-04-05: qty 5

## 2018-04-05 MED ORDER — FLUTICASONE PROPIONATE 50 MCG/ACT NA SUSP
2.0000 | Freq: Every day | NASAL | Status: DC
  Filled 2018-04-05: qty 16

## 2018-04-05 SURGICAL SUPPLY — 47 items
ADH SKN CLS APL DERMABOND .7 (GAUZE/BANDAGES/DRESSINGS) ×1
BLADE CLIPPER SURG (BLADE) ×1 IMPLANT
BLADE SURG 10 STRL SS (BLADE) ×1 IMPLANT
BLADE SURG 15 STRL LF DISP TIS (BLADE) ×1 IMPLANT
BLADE SURG 15 STRL SS (BLADE) ×2
CANISTER SUCT 3000ML PPV (MISCELLANEOUS) ×2 IMPLANT
CHLORAPREP W/TINT 26ML (MISCELLANEOUS) ×2 IMPLANT
CONT SPEC 4OZ CLIKSEAL STRL BL (MISCELLANEOUS) ×1 IMPLANT
COVER SURGICAL LIGHT HANDLE (MISCELLANEOUS) ×2 IMPLANT
DERMABOND ADVANCED (GAUZE/BANDAGES/DRESSINGS) ×1
DERMABOND ADVANCED .7 DNX12 (GAUZE/BANDAGES/DRESSINGS) ×1 IMPLANT
DRAIN PENROSE 1/2X12 LTX STRL (WOUND CARE) ×1 IMPLANT
DRAPE LAPAROTOMY TRNSV 102X78 (DRAPE) ×2 IMPLANT
DRAPE UTILITY XL STRL (DRAPES) ×2 IMPLANT
ELECT REM PT RETURN 9FT ADLT (ELECTROSURGICAL) ×2
ELECTRODE REM PT RTRN 9FT ADLT (ELECTROSURGICAL) ×1 IMPLANT
GLOVE EUDERMIC 7 POWDERFREE (GLOVE) ×2 IMPLANT
GOWN STRL REUS W/ TWL LRG LVL3 (GOWN DISPOSABLE) ×1 IMPLANT
GOWN STRL REUS W/ TWL XL LVL3 (GOWN DISPOSABLE) ×1 IMPLANT
GOWN STRL REUS W/TWL LRG LVL3 (GOWN DISPOSABLE) ×4
GOWN STRL REUS W/TWL XL LVL3 (GOWN DISPOSABLE) ×4
KIT BASIN OR (CUSTOM PROCEDURE TRAY) ×2 IMPLANT
KIT TURNOVER KIT B (KITS) ×2 IMPLANT
MESH ULTRAPRO 3X6 7.6X15CM (Mesh General) ×1 IMPLANT
NDL HYPO 25GX1X1/2 BEV (NEEDLE) ×1 IMPLANT
NEEDLE HYPO 25GX1X1/2 BEV (NEEDLE) ×2 IMPLANT
NS IRRIG 1000ML POUR BTL (IV SOLUTION) ×2 IMPLANT
PACK SURGICAL SETUP 50X90 (CUSTOM PROCEDURE TRAY) ×2 IMPLANT
PAD ARMBOARD 7.5X6 YLW CONV (MISCELLANEOUS) ×2 IMPLANT
PENCIL BUTTON HOLSTER BLD 10FT (ELECTRODE) ×2 IMPLANT
SPONGE LAP 18X18 X RAY DECT (DISPOSABLE) ×2 IMPLANT
SUT MNCRL AB 4-0 PS2 18 (SUTURE) ×3 IMPLANT
SUT PROLENE 2 0 CT2 30 (SUTURE) ×7 IMPLANT
SUT SILK 2 0 (SUTURE) ×2
SUT SILK 2 0 SH (SUTURE) ×1 IMPLANT
SUT SILK 2-0 18XBRD TIE 12 (SUTURE) ×1 IMPLANT
SUT VIC AB 2-0 BRD 54 (SUTURE) ×2 IMPLANT
SUT VIC AB 2-0 CT1 27 (SUTURE) ×2
SUT VIC AB 2-0 CT1 TAPERPNT 27 (SUTURE) ×1 IMPLANT
SUT VIC AB 3-0 SH 27 (SUTURE) ×2
SUT VIC AB 3-0 SH 27XBRD (SUTURE) ×1 IMPLANT
SYR 20CC LL (SYRINGE) ×1 IMPLANT
SYR BULB 3OZ (MISCELLANEOUS) ×2 IMPLANT
SYR CONTROL 10ML LL (SYRINGE) ×2 IMPLANT
TOWEL OR 17X26 10 PK STRL BLUE (TOWEL DISPOSABLE) ×2 IMPLANT
TUBE CONNECTING 12X1/4 (SUCTIONS) ×1 IMPLANT
YANKAUER SUCT BULB TIP NO VENT (SUCTIONS) ×2 IMPLANT

## 2018-04-05 NOTE — Op Note (Signed)
Patient Name:           Craig Vazquez   Date of Surgery:        04/05/2018  Pre op Diagnosis:      Incarcerated left inguinal hernia  Post op Diagnosis:    Recurrent, incarcerated left inguinal hernia  Procedure:                 Open repair of recurrent, incarcerated left inguinal hernia  Surgeon:                     Edsel Petrin. Dalbert Batman, M.D., FACS  Assistant:                      OR staff  Operative Indications:   . This is a pleasant 84 year old man, referred by Dr. Wenda Low for evaluation of left inguinal hernia.      He underwent right inguinal hernia repair in 5035 without complications. He does not report left inguinal hernia surgery he  has noticed a bulge in the left groin which does not hurt him. No gastrointestinal symptoms. This was noted by his PCP and he was referred. A CT scan has been done that shows a left inguinal hernia containing fat and fluid. [name] BPH is noted.       Past history significant for glaucoma, cerebrovascular accident 2014 without residual. Stable asthma. BPH. Peptic ulcer disease with history of pyloroplasty and vagotomy. Asymptomatic gallstones. Right inguinal hernia repair 1974. He takes an aspirin a day.      We had a long talk about his hernia. I could not push it back in and it was a little bit tender when I tried. I advised him to have this repaired due to the fact that it is incarcerated and nonreducible. He agrees.  He will be scheduled for open repair of left inguinal hernia with mesh. [name]   Operative Findings:       After we clipped his hair  he had a very faint transverse scar in his left groin.  Expiration of the left groin reveals some old sutures from what looked like a Programmer, applications.  There was also scar tissue appropriate for this.  The recurrent hernia was medial, direct recurrence, just lateral to the pubic tubercle.  After dissecting and debriding the hernia sac there was just a little bit of fat that was reduced, after  releasing the fascia a little bit..  This defect was repaired with Prolene sutures.  There was no evidence of indirect hernia.  A standard Lichtenstein repair with mesh was performed.  Procedure in Detail:          Following the induction of general endotracheal anesthesia the patient's abdomen and genitalia were prepped and draped in a sterile fashion.  Surgical timeout was performed.  Intravenous antibiotics were given.  A 50% solution of Exparel was used as a local infiltration anesthetic in all tissue layers.  A left TAPP block had been performed preop by the anesthesia department.      A transverse incision was made in the left groin.  Basically through the old scar.  Dissection was carried down through the old subcutaneous scar tissue until we identified the external oblique. The hernia was protruding through the external inguinal ring.  The external oblique was incised in the direction of its fibers, carefully opening of the external ring.  The external oblique was dissected away from the underlying tissues.  I could  dissect this well up superiorly and I was able to dissect down until we could see the inguinal ligament and some sutures there.  He had a direct hernia recurrence medially which could not be manually reduced.  I opened up the fascia a little bit.  I slowly dissected the sac.  Completely opened the sac up finding that there was only fat within this which was reduced.  The hernia sac and several small steps and was divided by placing hemostats across several areas, transecting the sac and then ligating the cut edges of the sac with 2-0 Vicryl ties.  Everything easily reduced.  The defect was closed with figure-of-eight sutures of 2-0 Prolene.      The floor of the inguinal canal was then repaired and reinforced by a 3 inch x 6 inch piece of ultra pro mesh.  The mesh was trimmed at the corners to accommodate the anatomy of the wound.  The mesh was incised laterally so as to wrap around the cord  structures at the internal ring.  The mesh was sutured in place with interrupted mattress sutures of 2-0 Prolene throughout.  The mesh was sutured so as to generously overlap the fascia at the pubic tubercle.  Along the inguinal ligament inferiorly.  Mattress sutures were placed medially, superiorly, and superior laterally.  The tails of the mesh were overlapped laterally and further Prolene sutures were placed laterally.  This provided very secure repair both medial and lateral to the internal ring but allowed an adequate fingertip opening for the cord structures.    The wound was irrigated.  Hemostasis was excellent.  The external oblique was closed with a running suture of 2-0 Vicryl placing the cord structures and the mesh deep to the external oblique.  Scarpa's fascia was closed with 3-0 Vicryl sutures and the skin closed with a running subcuticular 4-0 Monocryl and Dermabond.  Patient tolerated the procedure well and was taken to PACU in stable condition.  EBL 10 cc.  Counts correct.  Complications none.    Addendum: I logged onto the [employer] and reviewed his prescription medication history.           Edsel Petrin. Dalbert Batman, M.D., FACS General and Minimally Invasive Surgery Breast and Colorectal Surgery  04/05/2018 12:49 PM

## 2018-04-05 NOTE — Plan of Care (Signed)

## 2018-04-05 NOTE — Interval H&P Note (Signed)
History and Physical Interval Note:  04/05/2018 9:41 AM  Craig Vazquez  has presented today for surgery, with the diagnosis of INCARCERATED INGUINAL HERNIA  The various methods of treatment have been discussed with the patient and family. After consideration of risks, benefits and other options for treatment, the patient has consented to  Procedure(s): OPEN REPAIR OF LEFT INGUINAL HERNIA (Left) INSERTION OF MESH (Left) as a surgical intervention .  The patient's history has been reviewed, patient examined, no change in status, stable for surgery.  I have reviewed the patient's chart and labs.  Questions were answered to the patient's satisfaction.     [name]

## 2018-04-05 NOTE — Anesthesia Postprocedure Evaluation (Signed)
Anesthesia Post Note  Patient: Craig Vazquez  Procedure(s) Performed: OPEN REPAIR OF LEFT INGUINAL HERNIA (Left ) INSERTION OF MESH (Left )     Patient location during evaluation: PACU Anesthesia Type: Regional and General Level of consciousness: awake and alert Pain management: pain level controlled Vital Signs Assessment: post-procedure vital signs reviewed and stable Respiratory status: spontaneous breathing, nonlabored ventilation, respiratory function stable and patient connected to nasal cannula oxygen Cardiovascular status: blood pressure returned to baseline and stable Postop Assessment: no apparent nausea or vomiting Anesthetic complications: no    Last Vitals:  Vitals:   04/05/18 1415 04/05/18 1434  BP:  (!) 164/69  Pulse: 72 75  Resp: 12 16  Temp:  36.9 C  SpO2: 94% 98%    Last Pain:  Vitals:   04/05/18 1450  TempSrc:   PainSc: 0-No pain                 Barnet Glasgow

## 2018-04-05 NOTE — Anesthesia Procedure Notes (Signed)
Anesthesia Regional Block: TAP block   Pre-Anesthetic Checklist: ,, timeout performed, Correct Patient, Correct Site, Correct Laterality, Correct Procedure, Correct Position, site marked, Risks and benefits discussed,  Surgical consent,  Pre-op evaluation,  At surgeon's request and post-op pain management  Laterality: Left  Prep: chloraprep       Needles:  Injection technique: Single-shot  Needle Type: Echogenic Stimulator Needle     Needle Length: 10cm  Needle Gauge: 21     Additional Needles:   Procedures:,,,, ultrasound used (permanent image in chart),,,,  Narrative:  Start time: 04/05/2018 10:35 AM End time: 04/05/2018 10:45 AM Injection made incrementally with aspirations every 5 mL.  Performed by: Personally  Anesthesiologist: Murvin Natal, MD  Additional Notes: Functioning IV was confirmed and monitors were applied.  A timeout was performed. Sterile prep, hand hygiene and sterile gloves were used. A 183mm 21ga Pajunk echogenic stimulator needle was used. Negative aspiration and negative test dose prior to incremental administration of local anesthetic. The patient tolerated the procedure well.  Ultrasound guidance: relevent anatomy identified, needle position confirmed, local anesthetic spread visualized around nerve(s), vascular puncture avoided.  Image printed for medical record.

## 2018-04-05 NOTE — Transfer of Care (Signed)
Immediate Anesthesia Transfer of Care Note  Patient: Craig Vazquez  Procedure(s) Performed: OPEN REPAIR OF LEFT INGUINAL HERNIA (Left ) INSERTION OF MESH (Left )  Patient Location: PACU  Anesthesia Type:GA combined with regional for post-op pain  Level of Consciousness: awake, alert  and oriented  Airway & Oxygen Therapy: Patient Spontanous Breathing  Post-op Assessment: Report given to RN and Post -op Vital signs reviewed and stable  Post vital signs: Reviewed and stable  Last Vitals:  Vitals Value Taken Time  BP 160/80 04/05/2018  1:05 PM  Temp    Pulse 82 04/05/2018  1:07 PM  Resp 12 04/05/2018  1:07 PM  SpO2 93 % 04/05/2018  1:07 PM  Vitals shown include unvalidated device data.  Last Pain:  Vitals:   04/05/18 1040  TempSrc:   PainSc: 0-No pain         Complications: No apparent anesthesia complications

## 2018-04-05 NOTE — Anesthesia Procedure Notes (Signed)
Procedure Name: Intubation Date/Time: 04/05/2018 11:46 AM Performed by: Oletta Lamas, CRNA Pre-anesthesia Checklist: Patient identified, Emergency Drugs available, Suction available and Patient being monitored Patient Re-evaluated:Patient Re-evaluated prior to induction Oxygen Delivery Method: Circle System Utilized Preoxygenation: Pre-oxygenation with 100% oxygen Induction Type: IV induction Ventilation: Mask ventilation without difficulty and Oral airway inserted - appropriate to patient size Laryngoscope Size: Sabra Heck and 2 Grade View: Grade I Tube type: Oral Tube size: 7.0 mm Number of attempts: 1 Airway Equipment and Method: Stylet and Oral airway Placement Confirmation: ETT inserted through vocal cords under direct vision,  positive ETCO2 and breath sounds checked- equal and bilateral Secured at: 23 cm Tube secured with: Tape Dental Injury: Teeth and Oropharynx as per pre-operative assessment

## 2018-04-05 NOTE — Anesthesia Preprocedure Evaluation (Addendum)
Anesthesia Evaluation  Patient identified by MRN, date of birth, ID band Patient awake    Reviewed: Allergy & Precautions, NPO status , Patient's Chart, lab work & pertinent test results  Airway Mallampati: II  TM Distance: >3 FB Neck ROM: Full    Dental no notable dental hx.    Pulmonary asthma , former smoker,    Pulmonary exam normal breath sounds clear to auscultation       Cardiovascular negative cardio ROS Normal cardiovascular exam Rhythm:Regular Rate:Normal  ECG: rate 84. Sinus rhythm with Premature atrial complexes. Right bundle branch block   Neuro/Psych Vertigo CVA, No Residual Symptoms negative psych ROS   GI/Hepatic negative GI ROS, Neg liver ROS,   Endo/Other  diabetes, Oral Hypoglycemic Agents  Renal/GU negative Renal ROS     Musculoskeletal negative musculoskeletal ROS (+)   Abdominal   Peds  Hematology  (+) anemia ,   Anesthesia Other Findings INCARCERATED INGUINAL HERNIA  Reproductive/Obstetrics                            Anesthesia Physical Anesthesia Plan  ASA: III  Anesthesia Plan: General and Regional   Post-op Pain Management: GA combined w/ Regional for post-op pain   Induction: Intravenous  PONV Risk Score and Plan: 2 and Ondansetron, Dexamethasone and Treatment may vary due to age or medical condition  Airway Management Planned: Oral ETT  Additional Equipment:   Intra-op Plan:   Post-operative Plan: Extubation in OR  Informed Consent: I have reviewed the patients History and Physical, chart, labs and discussed the procedure including the risks, benefits and alternatives for the proposed anesthesia with the patient or authorized representative who has indicated his/her understanding and acceptance.   Dental advisory given  Plan Discussed with: CRNA  Anesthesia Plan Comments:        Anesthesia Quick Evaluation

## 2018-04-06 DIAGNOSIS — K4031 Unilateral inguinal hernia, with obstruction, without gangrene, recurrent: Secondary | ICD-10-CM | POA: Diagnosis not present

## 2018-04-06 DIAGNOSIS — N400 Enlarged prostate without lower urinary tract symptoms: Secondary | ICD-10-CM | POA: Diagnosis not present

## 2018-04-06 DIAGNOSIS — Z23 Encounter for immunization: Secondary | ICD-10-CM | POA: Diagnosis not present

## 2018-04-06 DIAGNOSIS — H409 Unspecified glaucoma: Secondary | ICD-10-CM | POA: Diagnosis not present

## 2018-04-06 DIAGNOSIS — J45909 Unspecified asthma, uncomplicated: Secondary | ICD-10-CM | POA: Diagnosis not present

## 2018-04-06 DIAGNOSIS — E119 Type 2 diabetes mellitus without complications: Secondary | ICD-10-CM | POA: Diagnosis not present

## 2018-04-06 NOTE — Discharge Instructions (Signed)
You may start taking your aspirin pill Saturday 9/28  HERNIA REPAIR: POST OP INSTRUCTIONS  ######################################################################  EAT Gradually transition to a high fiber diet with a fiber supplement over the next few weeks after discharge.  Start with a pureed / full liquid diet (see below)  WALK Walk an hour a day.  Control your pain to do that.    CONTROL PAIN Control pain so that you can walk, sleep, tolerate sneezing/coughing, and go up/down stairs.  HAVE A BOWEL MOVEMENT DAILY Keep your bowels regular to avoid problems.  OK to try a laxative to override constipation.  OK to use an antidairrheal to slow down diarrhea.  Call if not better after 2 tries  CALL IF YOU HAVE PROBLEMS/CONCERNS Call if you are still struggling despite following these instructions. Call if you have concerns not answered by these instructions  ######################################################################    1. DIET: Follow a light bland diet the first 24 hours after arrival home, such as soup, liquids, crackers, etc.  Be sure to include lots of fluids daily.  Advance to a low fat / high fiber diet over the next few days after surgery.  Avoid fast food or heavy meals the first week as your are more likely to get nauseated.    2. Take your usually prescribed home medications unless otherwise directed.  3. PAIN CONTROL: a. Pain is best controlled by a usual combination of three different methods TOGETHER: i. Ice/Heat ii. Over the counter pain medication iii. Prescription pain medication b. Most patients will experience some swelling and bruising around the hernia(s) such as the bellybutton, groins, or old incisions.  Ice packs or heating pads (30-60 minutes up to 6 times a day) will help. Use ice for the first few days to help decrease swelling and bruising, then switch to heat to help relax tight/sore spots and speed recovery.  Some people prefer to use ice alone,  heat alone, alternating between ice & heat.  Experiment to what works for you.  Swelling and bruising can take several weeks to resolve.   c. It is helpful to take an over-the-counter pain medication regularly for the first few weeks.  Choose one of the following that works best for you: i. Naproxen (Aleve, etc)  Two 220mg  tabs twice a day ii. Ibuprofen (Advil, etc) Three 200mg  tabs four times a day (every meal & bedtime) iii. Acetaminophen (Tylenol, etc) 325-650mg  four times a day (every meal & bedtime) d. A  prescription for pain medication should be given to you upon discharge.  Take your pain medication as prescribed.  i. If you are having problems/concerns with the prescription medicine (does not control pain, nausea, vomiting, rash, itching, etc), please call us [phone] to see if we need to switch you to a different pain medicine that will work better for you and/or control your side effect better. ii. If you need a refill on your pain medication, please contact your pharmacy.  They will contact our office to request authorization. Prescriptions will not be filled after 5 pm or on week-ends.  4. Avoid getting constipated.  Between the surgery and the pain medications, it is common to experience some constipation.  Increasing fluid intake and taking a fiber supplement (such as Metamucil, Citrucel, FiberCon, MiraLax, etc) 1-2 times a day regularly will usually help prevent this problem from occurring.  A mild laxative (prune juice, Milk of Magnesia, MiraLax, etc) should be taken according to package directions if there are no bowel movements after  48 hours.    5. Wash / shower every day.  You may shower over the dressings as they are waterproof.    6. Remove your waterproof bandages, skin tapes, and other bandages 5 days after surgery. You may replace a dressing/Band-Aid to cover the incision for comfort if you wish. You may leave the incisions open to air.  You may replace a  dressing/Band-Aid to cover an incision for comfort if you wish.  Continue to shower over incision(s) after the dressing is off.  7. ACTIVITIES as tolerated:   a. You may resume regular (light) daily activities beginning the next day--such as daily self-care, walking, climbing stairs--gradually increasing activities as tolerated.  Control your pain so that you can walk an hour a day.  If you can walk 30 minutes without difficulty, it is safe to try more intense activity such as jogging, treadmill, bicycling, low-impact aerobics, swimming, etc. b. Save the most intensive and strenuous activity for last such as sit-ups, heavy lifting, contact sports, etc  Refrain from any heavy lifting or straining until you are off narcotics for pain control.   c. DO NOT PUSH THROUGH PAIN.  Let pain be your guide: If it hurts to do something, don't do it.  Pain is your body warning you to avoid that activity for another week until the pain goes down. d. You may drive when you are no longer taking prescription pain medication, you can comfortably wear a seatbelt, and you can safely maneuver your car and apply brakes. e. Dennis Bast may have sexual intercourse when it is comfortable.   8. FOLLOW UP in our office a. Please call [facility] at [phone] to set up an appointment to see your surgeon in the office for a follow-up appointment approximately 2-3 weeks after your surgery. b. Make sure that you call for this appointment the day you arrive home to insure a convenient appointment time.  9.  If you have disability of FMLA / Family leave forms, please bring the forms to the office for processing.  (do not give to your surgeon).  WHEN TO CALL us [phone]: 1. Poor pain control 2. Reactions / problems with new medications (rash/itching, nausea, etc)  3. Fever over 101.5 F (38.5 C) 4. Inability to urinate 5. Nausea and/or vomiting 6. Worsening swelling or bruising 7. Continued bleeding from incision. 8. Increased pain,  redness, or drainage from the incision   The clinic staff is available to answer your questions during regular business hours (8:30am-5pm).  Please dont hesitate to call and ask to speak to one of our nurses for clinical concerns.   If you have a medical emergency, go to the nearest emergency room or call 911.  A surgeon from [facility] is always on call at the hospitals in [facility], [place], [place], [place], [place]  [address] ?  [address] [address], [place], [place]   [address] MAIN: [phone] ? TOLL FREE: [phone] ? FAX: [phone] [email or web address]

## 2018-04-06 NOTE — Discharge Summary (Signed)
Physician Discharge Summary    Patient ID: Craig Vazquez MRN: [id number] DOB/AGE: 82-Mar-1932  82 y.o.  Admit date: 04/05/2018 Discharge date: 04/06/2018   Hospital Stay = 0 days  Patient Care Team: Wenda Low, MD as PCP - General (Internal Medicine)  Discharge Diagnoses:  Principal Problem:   Recurrent irreducible left inguinal hernia   1 Day Post-Op  04/05/2018  POST-OPERATIVE DIAGNOSIS:   INCARCERATED INGUINAL HERNIA   SURGERY:  04/05/2018  Procedure(s): OPEN REPAIR OF LEFT INGUINAL HERNIA INSERTION OF MESH  SURGEON:    Surgeon(s): Fanny Skates, MD  Consults: None  Hospital Course:   The patient underwent the surgery above.  Postoperatively, the patient gradually mobilized and advanced to a solid diet.  Pain and other symptoms were treated aggressively.    By the time of discharge, the patient was walking well the hallways, eating food, having flatus.  Pain was well-controlled on an oral medications.  Based on meeting discharge criteria and continuing to recover, I felt it was safe for the patient to be discharged from the hospital to further recover with close followup. Postoperative recommendations were discussed in detail.  They are written as well.  Discharged Condition: good  Disposition:  Follow-up Information    Fanny Skates, MD. Schedule an appointment as soon as possible for a visit in 3 week(s).   Specialty:  General Surgery Contact information: [place] [address] [phone]           Discharge disposition: 01-Home or Self Care       Discharge Instructions    Call MD for:   Complete by:  As directed    FEVER > 101.5 F  (temperatures < 101.5 F are not significant)   Call MD for:  extreme fatigue   Complete by:  As directed    Call MD for:  persistant dizziness or light-headedness   Complete by:  As directed    Call MD for:  persistant nausea and vomiting   Complete by:  As directed    Call  MD for:  redness, tenderness, or signs of infection (pain, swelling, redness, odor or green/yellow discharge around incision site)   Complete by:  As directed    Call MD for:  severe uncontrolled pain   Complete by:  As directed    Diet - low sodium heart healthy   Complete by:  As directed    Start with a bland diet such as soups, liquids, starchy foods, low fat foods, etc. the first few days at home. Gradually advance to a solid, low-fat, high fiber diet by the end of the first week at home.   Add a fiber supplement to your diet (Metamucil, etc) If you feel full, bloated, or constipated, stay on a full liquid or pureed/blenderized diet for a few days until you feel better and are no longer constipated.   Discharge instructions   Complete by:  As directed    See Discharge Instructions If you are not getting better after two weeks or are noticing you are getting worse, contact our office [phone] for further advice.  We may need to adjust your medications, re-evaluate you in the office, send you to the emergency room, or see what other things we can do to help. The clinic staff is available to answer your questions during regular business hours (8:30am-5pm).  Please don't hesitate to call and ask to speak to one of our nurses for clinical concerns.  A surgeon from [facility] is always on call at the hospitals 24 hours/day If you have a medical emergency, go to the nearest emergency room or call 911.   Discharge wound care:   Complete by:  As directed    It is good for closed incisions and even open wounds to be washed every day.  Shower every day.  Short baths are fine.  Wash the incisions and wounds clean with soap & water.     You may leave closed incisions open to air if it is dry.   You may cover the incision with clean gauze & replace it after your daily shower for comfort.   You skin glue (Dermabond) on your incision, leave them in place.  They will fall off on  their own like a scab.  You may trim any edges that curl up with clean scissors.   Driving Restrictions   Complete by:  As directed    You may drive when: - you are no longer taking narcotic prescription pain medication - you can comfortably wear a seatbelt - you can safely make sudden turns/stops without pain.   Increase activity slowly   Complete by:  As directed    Start light daily activities --- self-care, walking, climbing stairs- beginning the day after surgery.  Gradually increase activities as tolerated.  Control your pain to be active.  Stop when you are tired.  Ideally, walk several times a day, eventually an hour a day.   Most people are back to most day-to-day activities in a few weeks.  It takes 4-6 weeks to get back to unrestricted, intense activity. If you can walk 30 minutes without difficulty, it is safe to try more intense activity such as jogging, treadmill, bicycling, low-impact aerobics, swimming, etc. Save the most intensive and strenuous activity for last (Usually 4-8 weeks after surgery) such as sit-ups, heavy lifting, contact sports, etc.  Refrain from any intense heavy lifting or straining until you are off narcotics for pain control.  You will have off days, but things should improve week-by-week. DO NOT PUSH THROUGH PAIN.  Let pain be your guide: If it hurts to do something, don't do it.   Lifting restrictions   Complete by:  As directed    If you can walk 30 minutes without difficulty, it is safe to try more intense activity such as jogging, treadmill, bicycling, low-impact aerobics, swimming, etc. Save the most intensive and strenuous activity for last (Usually 4-8 weeks after surgery) such as sit-ups, heavy lifting, contact sports, etc.   Refrain from any intense heavy lifting or straining until you are off narcotics for pain control.  You will have off days, but things should improve week-by-week. DO NOT PUSH THROUGH PAIN.  Let pain be your guide: If it hurts to do  something, don't do it.  Pain is your body warning you to avoid that activity for another week until the pain goes down.   May shower / Bathe   Complete by:  As directed    May walk up steps   Complete by:  As directed    Sexual Activity Restrictions   Complete by:  As directed    You may have sexual intercourse when it is comfortable. If it hurts to do something, stop.      Allergies as of 04/06/2018   No Known Allergies     Medication List    TAKE these medications   albuterol 108 (90 Base) MCG/ACT inhaler  Commonly known as:  PROVENTIL HFA;VENTOLIN HFA Inhale 1 puff into the lungs every 4 (four) hours as needed for wheezing or shortness of breath.   aspirin 325 MG tablet Take 1 tablet (325 mg total) by mouth daily.   FLOVENT DISKUS 100 MCG/BLIST Aepb Generic drug:  Fluticasone Propionate (Inhal) Inhale 1 Inhaler into the lungs daily as needed (for respiratory issues.).   fluticasone 50 MCG/ACT nasal spray Commonly known as:  FLONASE Place 2 sprays into the nose daily as needed for allergies.   metFORMIN 500 MG tablet Commonly known as:  GLUCOPHAGE Take 500 mg by mouth daily.   sucralfate 1 GM/10ML suspension Commonly known as:  CARAFATE Take 1 g by mouth 2 (two) times daily as needed (stomach issue).   sucralfate 1 g tablet Commonly known as:  CARAFATE Take 1 g by mouth 2 (two) times daily.            Discharge Care Instructions  (From admission, onward)         Start     Ordered   04/06/18 0000  Discharge wound care:    Comments:  It is good for closed incisions and even open wounds to be washed every day.  Shower every day.  Short baths are fine.  Wash the incisions and wounds clean with soap & water.     You may leave closed incisions open to air if it is dry.   You may cover the incision with clean gauze & replace it after your daily shower for comfort.   You skin glue (Dermabond) on your incision, leave them in place.  They will fall off on their  own like a scab.  You may trim any edges that curl up with clean scissors.   04/06/18 0749          Significant Diagnostic Studies:  Results for orders placed or performed during the hospital encounter of 04/05/18 (from the past 72 hour(s))  Glucose, capillary     Status: Abnormal   Collection Time: 04/05/18  8:59 AM  Result Value Ref Range   Glucose-Capillary 105 (H) 70 - 99 mg/dL  Glucose, capillary     Status: Abnormal   Collection Time: 04/05/18 10:45 AM  Result Value Ref Range   Glucose-Capillary 101 (H) 70 - 99 mg/dL   Comment 1 Notify RN   Glucose, capillary     Status: Abnormal   Collection Time: 04/05/18  1:13 PM  Result Value Ref Range   Glucose-Capillary 107 (H) 70 - 99 mg/dL  CBC     Status: Abnormal   Collection Time: 04/05/18  2:49 PM  Result Value Ref Range   WBC 10.1 4.0 - 10.5 K/uL   RBC 3.65 (L) 4.22 - 5.81 MIL/uL   Hemoglobin 8.6 (L) 13.0 - 17.0 g/dL   HCT 28.4 (L) 39.0 - 52.0 %   MCV 77.8 (L) 78.0 - 100.0 fL   MCH 23.6 (L) 26.0 - 34.0 pg   MCHC 30.3 30.0 - 36.0 g/dL   RDW 15.8 (H) 11.5 - 15.5 %   Platelets 279 150 - 400 K/uL    Comment: Performed at [facility], [place] [address] [place], [place] [address]  Creatinine, serum     Status: Abnormal   Collection Time: 04/05/18  2:49 PM  Result Value Ref Range   Creatinine, Ser 1.11 0.61 - 1.24 mg/dL   GFR calc non Af Amer 59 (L) >60 mL/min   GFR calc Af Amer >60 >60  mL/min    Comment: (NOTE) The eGFR has been calculated using the CKD EPI equation. This calculation has not been validated in all clinical situations. eGFR's persistently <60 mL/min signify possible Chronic Kidney Disease. Performed at [facility], [place] [address], [place], [place] [address]     No results found.  Discharge Exam: Blood pressure (!) 145/76, pulse 69, temperature (!) 97.4 F (36.3 C), temperature source Oral, resp. rate 16, height 5' 10" (1.778 m), weight 68.3 kg, SpO2 99 %.  General: Pt  awake/alert/oriented x4 in No acute distress Eyes: PERRL, normal EOM.  Sclera clear.  No icterus Neuro: CN II-XII intact w/o focal sensory/motor deficits. Lymph: No head/neck/groin lymphadenopathy Psych:  No delerium/psychosis/paranoia HENT: Normocephalic, Mucus membranes moist.  No thrush Neck: Supple, No tracheal deviation Chest: No chest wall pain w good excursion CV:  Pulses intact.  Regular rhythm MS: Normal AROM mjr joints.  No obvious deformity Abdomen: Soft.  Nondistended.  Nontender.  No evidence of peritonitis.  No incarcerated hernias. GU.  NEMG, L groin incision c/d/i.  No ecchymosis/hematoma Ext:  SCDs BLE.  No mjr edema.  No cyanosis Skin: No petechiae / purpura  Past Medical History:  Diagnosis Date  . Asthma   . Diabetes mellitus without complication ([place])    Borderline diabetic  . Hypoglycemia   . Recurrent irreducible left inguinal hernia 04/05/2018  . Stroke ([place])    5.1.14  . Vertigo     Past Surgical History:  Procedure Laterality Date  . APPENDECTOMY    . CHOLECYSTECTOMY    . HAND SURGERY     rt hand  . herina    . HERNIA REPAIR    . INGUINAL HERNIA REPAIR  04/05/2018   OPEN   . LAPAROSCOPIC GASTROTOMY W/ REPAIR OF ULCER  1970  . MOHS SURGERY  2017  . TEE WITHOUT CARDIOVERSION N/A 01/16/2013   Procedure: TRANSESOPHAGEAL ECHOCARDIOGRAM (TEE);  Surgeon: Thayer Headings, MD;  Location: [place];  Service: Cardiovascular;  Laterality: N/A;  . TONSILLECTOMY      Social History   Socioeconomic History  . Marital status: Married    Spouse name: Not on file  . Number of children: 2  . Years of education: DOCTRATE  . Highest education level: Not on file  Occupational History  . Occupation: retired  Scientific laboratory technician  . Financial resource strain: Not on file  . Food insecurity:    Worry: Not on file    Inability: Not on file  . Transportation needs:    Medical: Not on file    Non-medical: Not on file  Tobacco Use  . Smoking status: Former Smoker     Last attempt to quit: 1970    Years since quitting: 49.7  . Smokeless tobacco: Never Used  . Tobacco comment: quit 1970  Substance and Sexual Activity  . Alcohol use: Yes    Comment: BEER/WINE OCC  . Drug use: No  . Sexual activity: Not on file  Lifestyle  . Physical activity:    Days per week: Not on file    Minutes per session: Not on file  . Stress: Not on file  Relationships  . Social connections:    Talks on phone: Not on file    Gets together: Not on file    Attends religious service: Not on file    Active member of club or organization: Not on file    Attends meetings of clubs or organizations: Not on file  Relationship status: Not on file  . Intimate partner violence:    Fear of current or ex partner: Not on file    Emotionally abused: Not on file    Physically abused: Not on file    Forced sexual activity: Not on file  Other Topics Concern  . Not on file  Social History Narrative   Patient is married with 2 children.   Patient is ambidextrous.   Patient has a Statistician in criminology.   Patient drinks 1-2 cups daily.    Family History  Problem Relation Age of Onset  . Cancer Brother        lung ca x 2 brothers    Current Facility-Administered Medications  Medication Dose Route Frequency Provider Last Rate Last Dose  . albuterol (PROVENTIL) (2.5 MG/3ML) 0.083% nebulizer solution 2.5 mL  2.5 mL Inhalation Q4H PRN Fanny Skates, MD      . budesonide (PULMICORT) nebulizer solution 0.25 mg  0.25 mg Nebulization BID Fanny Skates, MD   0.25 mg at 04/05/18 1956  . enoxaparin (LOVENOX) injection 40 mg  40 mg Subcutaneous Q24H Fanny Skates, MD      . fluticasone [facility]) 50 MCG/ACT nasal spray 2 spray  2 spray Each Nare Daily Fanny Skates, MD      . HYDROmorphone (DILAUDID) injection 0.5 mg  0.5 mg Intravenous Q2H PRN Fanny Skates, MD      . Influenza vac split quadrivalent PF (FLUZONE HIGH-DOSE) injection 0.5 mL  0.5 mL Intramuscular  Tomorrow-1000 Fanny Skates, MD      . lactated ringers infusion   Intravenous Continuous Fanny Skates, MD 50 mL/hr at 04/06/18 0315    . metFORMIN (GLUCOPHAGE) tablet 500 mg  500 mg Oral Q breakfast Fanny Skates, MD      . ondansetron (ZOFRAN-ODT) disintegrating tablet 4 mg  4 mg Oral Q6H PRN Fanny Skates, MD       Or  . ondansetron [facility]) injection 4 mg  4 mg Intravenous Q6H PRN Fanny Skates, MD      . oxyCODONE-acetaminophen (PERCOCET/ROXICET) 5-325 MG per tablet 1-2 tablet  1-2 tablet Oral Q4H PRN Fanny Skates, MD      . pantoprazole (PROTONIX) EC tablet 40 mg  40 mg Oral Daily Fanny Skates, MD   40 mg at 04/05/18 1528  . senna (SENOKOT) tablet 8.6 mg  1 tablet Oral BID Fanny Skates, MD   8.6 mg at 04/05/18 2117  . sucralfate (CARAFATE) 1 GM/10ML suspension 1 g  1 g Oral BID PRN Fanny Skates, MD      . sucralfate (CARAFATE) tablet 1 g  1 g Oral BID Fanny Skates, MD   1 g at 04/05/18 1528  . traMADol (ULTRAM) tablet 50 mg  50 mg Oral Q6H PRN Fanny Skates, MD         No Known Allergies  Signed: Morton Peters, MD, FACS, MASCRS Gastrointestinal and Minimally Invasive Surgery    [address] [place], [place] [address] [phone] Main / Paging [phone] Fax   04/06/2018, 7:49 AM

## 2018-04-06 NOTE — Progress Notes (Signed)
Discharge home. Home discharge instruction given, no question verbalized. 

## 2018-04-08 ENCOUNTER — Encounter (HOSPITAL_COMMUNITY): Payer: Self-pay | Admitting: General Surgery

## 2018-06-11 DIAGNOSIS — H40053 Ocular hypertension, bilateral: Secondary | ICD-10-CM | POA: Diagnosis not present

## 2018-08-01 DIAGNOSIS — H409 Unspecified glaucoma: Secondary | ICD-10-CM | POA: Diagnosis not present

## 2018-08-01 DIAGNOSIS — D649 Anemia, unspecified: Secondary | ICD-10-CM | POA: Diagnosis not present

## 2018-08-01 DIAGNOSIS — R7303 Prediabetes: Secondary | ICD-10-CM | POA: Diagnosis not present

## 2018-08-01 DIAGNOSIS — R972 Elevated prostate specific antigen [PSA]: Secondary | ICD-10-CM | POA: Diagnosis not present

## 2018-08-01 DIAGNOSIS — J45909 Unspecified asthma, uncomplicated: Secondary | ICD-10-CM | POA: Diagnosis not present

## 2018-08-01 DIAGNOSIS — K279 Peptic ulcer, site unspecified, unspecified as acute or chronic, without hemorrhage or perforation: Secondary | ICD-10-CM | POA: Diagnosis not present

## 2018-08-01 DIAGNOSIS — M1990 Unspecified osteoarthritis, unspecified site: Secondary | ICD-10-CM | POA: Diagnosis not present

## 2018-08-01 DIAGNOSIS — Z1389 Encounter for screening for other disorder: Secondary | ICD-10-CM | POA: Diagnosis not present

## 2018-08-01 DIAGNOSIS — I639 Cerebral infarction, unspecified: Secondary | ICD-10-CM | POA: Diagnosis not present

## 2018-08-01 DIAGNOSIS — E7800 Pure hypercholesterolemia, unspecified: Secondary | ICD-10-CM | POA: Diagnosis not present

## 2018-08-01 DIAGNOSIS — Z7984 Long term (current) use of oral hypoglycemic drugs: Secondary | ICD-10-CM | POA: Diagnosis not present

## 2018-08-01 DIAGNOSIS — Z0000 Encounter for general adult medical examination without abnormal findings: Secondary | ICD-10-CM | POA: Diagnosis not present

## 2018-08-05 ENCOUNTER — Other Ambulatory Visit: Payer: Self-pay | Admitting: Gastroenterology

## 2018-08-05 DIAGNOSIS — Z9889 Other specified postprocedural states: Secondary | ICD-10-CM | POA: Diagnosis not present

## 2018-08-05 DIAGNOSIS — D509 Iron deficiency anemia, unspecified: Secondary | ICD-10-CM | POA: Diagnosis not present

## 2018-08-05 DIAGNOSIS — Z8673 Personal history of transient ischemic attack (TIA), and cerebral infarction without residual deficits: Secondary | ICD-10-CM | POA: Diagnosis not present

## 2018-08-05 DIAGNOSIS — Z8719 Personal history of other diseases of the digestive system: Secondary | ICD-10-CM | POA: Diagnosis not present

## 2018-08-09 DIAGNOSIS — D509 Iron deficiency anemia, unspecified: Secondary | ICD-10-CM | POA: Diagnosis not present

## 2018-08-13 ENCOUNTER — Other Ambulatory Visit: Payer: Self-pay

## 2018-08-13 ENCOUNTER — Ambulatory Visit (HOSPITAL_COMMUNITY)
Admission: RE | Admit: 2018-08-13 | Discharge: 2018-08-13 | Disposition: A | Discharge status: Home / Self Care | Payer: Medicare Other | Attending: Gastroenterology | Admitting: Gastroenterology

## 2018-08-13 ENCOUNTER — Ambulatory Visit (HOSPITAL_COMMUNITY): Payer: Medicare Other | Admitting: Anesthesiology

## 2018-08-13 ENCOUNTER — Encounter (HOSPITAL_COMMUNITY): Payer: Self-pay | Admitting: Certified Registered Nurse Anesthetist

## 2018-08-13 ENCOUNTER — Encounter (HOSPITAL_COMMUNITY)
Admission: RE | Disposition: A | Discharge status: Home / Self Care | Payer: Self-pay | Source: Home / Self Care | Attending: Gastroenterology

## 2018-08-13 DIAGNOSIS — Z7951 Long term (current) use of inhaled steroids: Secondary | ICD-10-CM | POA: Diagnosis not present

## 2018-08-13 DIAGNOSIS — Z79899 Other long term (current) drug therapy: Secondary | ICD-10-CM | POA: Insufficient documentation

## 2018-08-13 DIAGNOSIS — Z8719 Personal history of other diseases of the digestive system: Secondary | ICD-10-CM | POA: Diagnosis not present

## 2018-08-13 DIAGNOSIS — Z8673 Personal history of transient ischemic attack (TIA), and cerebral infarction without residual deficits: Secondary | ICD-10-CM | POA: Insufficient documentation

## 2018-08-13 DIAGNOSIS — Z7982 Long term (current) use of aspirin: Secondary | ICD-10-CM | POA: Insufficient documentation

## 2018-08-13 DIAGNOSIS — Z87891 Personal history of nicotine dependence: Secondary | ICD-10-CM | POA: Diagnosis not present

## 2018-08-13 DIAGNOSIS — Z8711 Personal history of peptic ulcer disease: Secondary | ICD-10-CM | POA: Diagnosis not present

## 2018-08-13 DIAGNOSIS — E119 Type 2 diabetes mellitus without complications: Secondary | ICD-10-CM | POA: Insufficient documentation

## 2018-08-13 DIAGNOSIS — Z980 Intestinal bypass and anastomosis status: Secondary | ICD-10-CM | POA: Insufficient documentation

## 2018-08-13 DIAGNOSIS — J45909 Unspecified asthma, uncomplicated: Secondary | ICD-10-CM | POA: Insufficient documentation

## 2018-08-13 DIAGNOSIS — K222 Esophageal obstruction: Secondary | ICD-10-CM | POA: Diagnosis not present

## 2018-08-13 DIAGNOSIS — D509 Iron deficiency anemia, unspecified: Secondary | ICD-10-CM | POA: Diagnosis not present

## 2018-08-13 DIAGNOSIS — K449 Diaphragmatic hernia without obstruction or gangrene: Secondary | ICD-10-CM | POA: Insufficient documentation

## 2018-08-13 DIAGNOSIS — Z7984 Long term (current) use of oral hypoglycemic drugs: Secondary | ICD-10-CM | POA: Insufficient documentation

## 2018-08-13 HISTORY — PX: ESOPHAGOGASTRODUODENOSCOPY (EGD) WITH PROPOFOL: SHX5813

## 2018-08-13 LAB — GLUCOSE, CAPILLARY: Glucose-Capillary: 96 mg/dL (ref 70–99)

## 2018-08-13 SURGERY — ESOPHAGOGASTRODUODENOSCOPY (EGD) WITH PROPOFOL
Anesthesia: Monitor Anesthesia Care

## 2018-08-13 MED ORDER — PROPOFOL 500 MG/50ML IV EMUL
INTRAVENOUS | Status: DC | PRN
  Administered 2018-08-13: 150 ug/kg/min via INTRAVENOUS

## 2018-08-13 MED ORDER — SODIUM CHLORIDE 0.9 % IV SOLN: INTRAVENOUS | Status: DC

## 2018-08-13 MED ORDER — PROPOFOL 500 MG/50ML IV EMUL
INTRAVENOUS | Status: DC | PRN
  Administered 2018-08-13: 30 mg via INTRAVENOUS

## 2018-08-13 MED ORDER — PROPOFOL 10 MG/ML IV BOLUS
INTRAVENOUS | Status: AC
  Filled 2018-08-13: qty 20

## 2018-08-13 MED ORDER — LACTATED RINGERS IV SOLN
INTRAVENOUS | Status: DC
  Administered 2018-08-13: 1000 mL via INTRAVENOUS

## 2018-08-13 SURGICAL SUPPLY — 14 items

## 2018-08-13 NOTE — Discharge Instructions (Signed)
YOU HAD AN ENDOSCOPIC PROCEDURE TODAY: Refer to the procedure report and other information in the discharge instructions given to you for any specific questions about what was found during the examination. If this information does not answer your questions, please call [place] office at [phone] to clarify.  ° °YOU SHOULD EXPECT: Some feelings of bloating in the abdomen. Passage of more gas than usual. Walking can help get rid of the air that was put into your GI tract during the procedure and reduce the bloating.. ° °DIET: Your first meal following the procedure should be a light meal and then it is ok to progress to your normal diet. A half-sandwich or bowl of soup is an example of a good first meal. Heavy or fried foods are harder to digest and may make you feel nauseous or bloated. Drink plenty of fluids but you should avoid alcoholic beverages for 24 hours. If you had a esophageal dilation, please see attached instructions for diet.   ° °ACTIVITY: Your care partner should take you home directly after the procedure. You should plan to take it easy, moving slowly for the rest of the day. You can resume normal activity the day after the procedure however YOU SHOULD NOT DRIVE, use power tools, machinery or perform tasks that involve climbing or major physical exertion for 24 hours (because of the sedation medicines used during the test).  ° °SYMPTOMS TO REPORT IMMEDIATELY: °A gastroenterologist can be reached at any hour. Please call [phone]  for any of the following symptoms:  ° °Following upper endoscopy (EGD, EUS, ERCP, esophageal dilation) °Vomiting of blood or coffee ground material  °New, significant abdominal pain  °New, significant chest pain or pain under the shoulder blades  °Painful or persistently difficult swallowing  °New shortness of breath  °Black, tarry-looking or red, bloody stools ° °FOLLOW UP:  °If any biopsies were taken you will be contacted by phone or by letter within the next 1-3  weeks. Call [phone]  if you have not heard about the biopsies in 3 weeks.  °Please also call with any specific questions about appointments or follow up tests. ° °

## 2018-08-13 NOTE — Transfer of Care (Signed)
Immediate Anesthesia Transfer of Care Note  Patient: Craig Vazquez  Procedure(s) Performed: ESOPHAGOGASTRODUODENOSCOPY (EGD) WITH PROPOFOL (N/A )  Patient Location: PACU  Anesthesia Type:MAC  Level of Consciousness: sedated, patient cooperative and responds to stimulation  Airway & Oxygen Therapy: Patient Spontanous Breathing and Patient connected to nasal cannula oxygen  Post-op Assessment: Report given to RN and Post -op Vital signs reviewed and stable  Post vital signs: Reviewed and stable  Last Vitals:  Vitals Value Taken Time  BP    Temp    Pulse 76 08/13/2018 11:33 AM  Resp 19 08/13/2018 11:33 AM  SpO2 94 % 08/13/2018 11:33 AM  Vitals shown include unvalidated device data.  Last Pain:  Vitals:   08/13/18 1025  PainSc: 0-No pain         Complications: No apparent anesthesia complications

## 2018-08-13 NOTE — Op Note (Signed)
[facility] Patient Name: Craig Vazquez Procedure Date: 08/13/2018 MRN: [id number] Attending MD: Otis Brace , MD Date of Birth: [birth year] CSN: [id number] Age: 85 Admit Type: Outpatient Procedure:                Upper GI endoscopy Indications:              Iron deficiency anemia Providers:                Otis Brace, MD, Zenon Mayo, RN, Cherylynn Ridges, Technician, Herbie Drape, CRNA Referring MD:              Medicines:                Sedation Administered by an Anesthesia Professional Complications:            No immediate complications. Estimated Blood Loss:     Estimated blood loss: none. Procedure:                Pre-Anesthesia Assessment:                           - Prior to the procedure, a History and Physical                            was performed, and patient medications and                            allergies were reviewed. The patient's tolerance of                            previous anesthesia was also reviewed. The risks                            and benefits of the procedure and the sedation                            options and risks were discussed with the patient.                            All questions were answered, and informed consent                            was obtained. Prior Anticoagulants: The patient has                            taken aspirin. ASA Grade Assessment: III - A                            patient with severe systemic disease. After                            reviewing the risks and benefits, the patient was  deemed in satisfactory condition to undergo the                            procedure.                           After obtaining informed consent, the endoscope was                            passed under direct vision. Throughout the                            procedure, the patient's blood pressure, pulse, and                            oxygen  saturations were monitored continuously. The                            GIF-H190 ([id number]) Olympus gastroscope was                            introduced through the mouth, and advanced to the                            second part of duodenum. The upper GI endoscopy was                            technically difficult and complex due to presence                            of food. The patient tolerated the procedure well. Scope In: Scope Out: Findings:      A non-obstructing Schatzki ring was found at the gastroesophageal       junction at 35 cm.      A medium-sized hiatal hernia was present.      The exam of the esophagus was otherwise normal.      A large amount of food (residue) was found in the entire examined       stomach.      Evidence of a previous surgical anastomosis was found in the prepyloric       region of the stomach. This was characterized by healthy appearing       mucosa.      The examined duodenum was normal. Impression:               - Non-obstructing Schatzki ring.                           - Medium-sized hiatal hernia.                           - A large amount of food (residue) in the stomach.                           - A previous surgical anastomosis was found,  characterized by healthy appearing mucosa.                           - Normal examined duodenum.                           - No specimens collected. Moderate Sedation:      Moderate (conscious) sedation was personally administered by an       anesthesia professional. The following parameters were monitored: oxygen       saturation, heart rate, blood pressure, and response to care. Recommendation:           - Patient has a contact number available for                            emergencies. The signs and symptoms of potential                            delayed complications were discussed with the                            patient. Return to normal activities tomorrow.                             Written discharge instructions were provided to the                            patient.                           - Resume previous diet.                           - Continue present medications.                           - Return to my office as previously scheduled. Procedure Code(s):        --- Professional ---                           [phone], Esophagogastroduodenoscopy, flexible,                            transoral; diagnostic, including collection of                            specimen(s) by brushing or washing, when performed                            (separate procedure) Diagnosis Code(s):        --- Professional ---                           K22.2, Esophageal obstruction                           K44.9, Diaphragmatic hernia without obstruction or  gangrene                           Z98.0, Intestinal bypass and anastomosis status                           D50.9, Iron deficiency anemia, unspecified CPT copyright 2018 American Medical Association. All rights reserved. The codes documented in this report are preliminary and upon coder review may  be revised to meet current compliance requirements. Otis Brace, MD Otis Brace, MD 08/13/2018 11:33:32 AM Number of Addenda: 0

## 2018-08-13 NOTE — Anesthesia Postprocedure Evaluation (Signed)
Anesthesia Post Note  Patient: Craig Vazquez DAY  Procedure(s) Performed: ESOPHAGOGASTRODUODENOSCOPY (EGD) WITH PROPOFOL (N/A )     Patient location during evaluation: PACU Anesthesia Type: MAC Level of consciousness: awake and alert Pain management: pain level controlled Vital Signs Assessment: post-procedure vital signs reviewed and stable Respiratory status: spontaneous breathing, nonlabored ventilation and respiratory function stable Cardiovascular status: blood pressure returned to baseline and stable Postop Assessment: no apparent nausea or vomiting Anesthetic complications: no    Last Vitals:  Vitals:   08/13/18 1140 08/13/18 1150  BP: (!) 164/60 (!) 147/57  Pulse: 75 82  Resp: (!) 21 20  Temp:    SpO2: 97% 97%    Last Pain:  Vitals:   08/13/18 1150  TempSrc:   PainSc: 0-No pain                 Lidia Collum

## 2018-08-13 NOTE — H&P (Signed)
Craig Vazquez is a 85 y.o. male has presented for EGD for evaluation of iron deficiency anemia. Patient with past medical history of peptic ulcers with perforation and bleeding requiring surgery remote past.  His hemoglobin was around 9.8 in 2019.  Blood work by primary care physician in January showed hemoglobin of 6.9.  Patient denies seeing any black stool or blood in the stool.  Repeat hemoglobin few days later on iron supplements showed improvement to 7.1.   The various methods of treatment have been discussed with the patient and family. After consideration of risks, benefits and other options for treatment, the patient has consented to  Procedure(s): Esophago-duodenoscopy as a surgical intervention .  The patient's history has been reviewed, patient examined, no change in status, stable for surgery.  I have reviewed the patient's chart and labs.  Questions were answered to the patient's satisfaction.   Risks (bleeding, infection, bowel perforation that could require surgery, sedation-related changes in cardiopulmonary systems), benefits (identification and possible treatment of source of symptoms, exclusion of certain causes of symptoms), and alternatives (watchful waiting, radiographic imaging studies, empiric medical treatment)  were explained to patient/family in detail and patient wishes to proceed.  Otis Brace MD, [place] 08/13/2018, 11:06 AM  Contact #  [phone]

## 2018-08-13 NOTE — Anesthesia Preprocedure Evaluation (Signed)
Anesthesia Evaluation    Reviewed: Allergy & Precautions, Patient's Chart, lab work & pertinent test results  History of Anesthesia Complications Negative for: history of anesthetic complications  Airway Mallampati: I  TM Distance: >3 FB Neck ROM: Full    Dental no notable dental hx. (+) Teeth Intact, Dental Advisory Given   Pulmonary asthma , former smoker,    Pulmonary exam normal        Cardiovascular negative cardio ROS Normal cardiovascular exam     Neuro/Psych CVA, No Residual Symptoms negative psych ROS   GI/Hepatic Neg liver ROS, GERD  ,  Endo/Other  diabetes  Renal/GU negative Renal ROS  negative genitourinary   Musculoskeletal negative musculoskeletal ROS (+)   Abdominal   Peds  Hematology  (+) anemia ,   Anesthesia Other Findings 85 yo M for EGD - PMH: asthma, CVA (2014, no residual), PFO, DM, GERD, anemia (Hgb 8.6)  Reproductive/Obstetrics                            Anesthesia Physical Anesthesia Plan  ASA: III  Anesthesia Plan: MAC   Post-op Pain Management:    Induction:   PONV Risk Score and Plan: 1 and Propofol infusion, TIVA and Treatment may vary due to age or medical condition  Airway Management Planned: Nasal Cannula and Simple Face Mask  Additional Equipment: None  Intra-op Plan:   Post-operative Plan:   Informed Consent: I have reviewed the patients History and Physical, chart, labs and discussed the procedure including the risks, benefits and alternatives for the proposed anesthesia with the patient or authorized representative who has indicated his/her understanding and acceptance.       Plan Discussed with:   Anesthesia Plan Comments:         Anesthesia Quick Evaluation

## 2018-08-14 ENCOUNTER — Encounter (HOSPITAL_COMMUNITY): Payer: Self-pay | Admitting: Gastroenterology

## 2018-09-03 DIAGNOSIS — H722X2 Other marginal perforations of tympanic membrane, left ear: Secondary | ICD-10-CM | POA: Diagnosis not present

## 2018-09-03 DIAGNOSIS — H9190 Unspecified hearing loss, unspecified ear: Secondary | ICD-10-CM | POA: Diagnosis not present

## 2019-01-29 DIAGNOSIS — K911 Postgastric surgery syndromes: Secondary | ICD-10-CM | POA: Diagnosis not present

## 2019-01-29 DIAGNOSIS — K279 Peptic ulcer, site unspecified, unspecified as acute or chronic, without hemorrhage or perforation: Secondary | ICD-10-CM | POA: Diagnosis not present

## 2019-01-29 DIAGNOSIS — R972 Elevated prostate specific antigen [PSA]: Secondary | ICD-10-CM | POA: Diagnosis not present

## 2019-01-29 DIAGNOSIS — I639 Cerebral infarction, unspecified: Secondary | ICD-10-CM | POA: Diagnosis not present

## 2019-01-29 DIAGNOSIS — J45909 Unspecified asthma, uncomplicated: Secondary | ICD-10-CM | POA: Diagnosis not present

## 2019-01-29 DIAGNOSIS — R7303 Prediabetes: Secondary | ICD-10-CM | POA: Diagnosis not present

## 2019-01-29 DIAGNOSIS — D509 Iron deficiency anemia, unspecified: Secondary | ICD-10-CM | POA: Diagnosis not present

## 2019-01-29 DIAGNOSIS — E7800 Pure hypercholesterolemia, unspecified: Secondary | ICD-10-CM | POA: Diagnosis not present

## 2019-02-06 DIAGNOSIS — H40003 Preglaucoma, unspecified, bilateral: Secondary | ICD-10-CM | POA: Diagnosis not present

## 2019-02-12 DIAGNOSIS — R7303 Prediabetes: Secondary | ICD-10-CM | POA: Diagnosis not present

## 2019-02-12 DIAGNOSIS — D509 Iron deficiency anemia, unspecified: Secondary | ICD-10-CM | POA: Diagnosis not present

## 2019-03-05 DIAGNOSIS — R7303 Prediabetes: Secondary | ICD-10-CM | POA: Diagnosis not present

## 2019-03-05 DIAGNOSIS — F4329 Adjustment disorder with other symptoms: Secondary | ICD-10-CM | POA: Diagnosis not present

## 2019-03-05 DIAGNOSIS — D509 Iron deficiency anemia, unspecified: Secondary | ICD-10-CM | POA: Diagnosis not present

## 2019-04-21 DIAGNOSIS — K4090 Unilateral inguinal hernia, without obstruction or gangrene, not specified as recurrent: Secondary | ICD-10-CM | POA: Diagnosis not present

## 2019-04-21 DIAGNOSIS — F341 Dysthymic disorder: Secondary | ICD-10-CM | POA: Diagnosis not present

## 2019-04-21 DIAGNOSIS — Z23 Encounter for immunization: Secondary | ICD-10-CM | POA: Diagnosis not present

## 2019-05-26 DIAGNOSIS — F341 Dysthymic disorder: Secondary | ICD-10-CM | POA: Diagnosis not present

## 2019-06-18 DIAGNOSIS — H40003 Preglaucoma, unspecified, bilateral: Secondary | ICD-10-CM | POA: Diagnosis not present

## 2019-06-18 DIAGNOSIS — H353131 Nonexudative age-related macular degeneration, bilateral, early dry stage: Secondary | ICD-10-CM | POA: Diagnosis not present

## 2019-08-11 DIAGNOSIS — I69359 Hemiplegia and hemiparesis following cerebral infarction affecting unspecified side: Secondary | ICD-10-CM | POA: Diagnosis not present

## 2019-08-11 DIAGNOSIS — K279 Peptic ulcer, site unspecified, unspecified as acute or chronic, without hemorrhage or perforation: Secondary | ICD-10-CM | POA: Diagnosis not present

## 2019-08-11 DIAGNOSIS — D509 Iron deficiency anemia, unspecified: Secondary | ICD-10-CM | POA: Diagnosis not present

## 2019-08-11 DIAGNOSIS — J45909 Unspecified asthma, uncomplicated: Secondary | ICD-10-CM | POA: Diagnosis not present

## 2019-08-11 DIAGNOSIS — K439 Ventral hernia without obstruction or gangrene: Secondary | ICD-10-CM | POA: Diagnosis not present

## 2019-08-11 DIAGNOSIS — N400 Enlarged prostate without lower urinary tract symptoms: Secondary | ICD-10-CM | POA: Diagnosis not present

## 2019-08-11 DIAGNOSIS — M1990 Unspecified osteoarthritis, unspecified site: Secondary | ICD-10-CM | POA: Diagnosis not present

## 2019-08-11 DIAGNOSIS — Z1389 Encounter for screening for other disorder: Secondary | ICD-10-CM | POA: Diagnosis not present

## 2019-08-11 DIAGNOSIS — Z8673 Personal history of transient ischemic attack (TIA), and cerebral infarction without residual deficits: Secondary | ICD-10-CM | POA: Diagnosis not present

## 2019-08-11 DIAGNOSIS — K4030 Unilateral inguinal hernia, with obstruction, without gangrene, not specified as recurrent: Secondary | ICD-10-CM | POA: Diagnosis not present

## 2019-08-11 DIAGNOSIS — Z0000 Encounter for general adult medical examination without abnormal findings: Secondary | ICD-10-CM | POA: Diagnosis not present

## 2019-08-11 DIAGNOSIS — F341 Dysthymic disorder: Secondary | ICD-10-CM | POA: Diagnosis not present

## 2019-08-11 DIAGNOSIS — E7800 Pure hypercholesterolemia, unspecified: Secondary | ICD-10-CM | POA: Diagnosis not present

## 2019-08-11 DIAGNOSIS — R7309 Other abnormal glucose: Secondary | ICD-10-CM | POA: Diagnosis not present

## 2019-08-26 ENCOUNTER — Ambulatory Visit: Payer: Medicare Other | Admitting: Orthopaedic Surgery

## 2019-09-02 DIAGNOSIS — L905 Scar conditions and fibrosis of skin: Secondary | ICD-10-CM | POA: Diagnosis not present

## 2019-09-02 DIAGNOSIS — L814 Other melanin hyperpigmentation: Secondary | ICD-10-CM | POA: Diagnosis not present

## 2019-09-02 DIAGNOSIS — L218 Other seborrheic dermatitis: Secondary | ICD-10-CM | POA: Diagnosis not present

## 2019-09-02 DIAGNOSIS — L298 Other pruritus: Secondary | ICD-10-CM | POA: Diagnosis not present

## 2019-09-02 DIAGNOSIS — D229 Melanocytic nevi, unspecified: Secondary | ICD-10-CM | POA: Diagnosis not present

## 2019-09-02 DIAGNOSIS — L570 Actinic keratosis: Secondary | ICD-10-CM | POA: Diagnosis not present

## 2019-09-02 DIAGNOSIS — Z85828 Personal history of other malignant neoplasm of skin: Secondary | ICD-10-CM | POA: Diagnosis not present

## 2019-09-02 DIAGNOSIS — L821 Other seborrheic keratosis: Secondary | ICD-10-CM | POA: Diagnosis not present

## 2019-09-02 DIAGNOSIS — L738 Other specified follicular disorders: Secondary | ICD-10-CM | POA: Diagnosis not present

## 2019-09-10 DIAGNOSIS — Z961 Presence of intraocular lens: Secondary | ICD-10-CM | POA: Diagnosis not present

## 2019-09-23 ENCOUNTER — Other Ambulatory Visit: Payer: Self-pay

## 2019-09-23 ENCOUNTER — Ambulatory Visit (INDEPENDENT_AMBULATORY_CARE_PROVIDER_SITE_OTHER): Payer: Medicare Other | Admitting: Orthopaedic Surgery

## 2019-09-23 ENCOUNTER — Encounter: Payer: Self-pay | Admitting: Orthopaedic Surgery

## 2019-09-23 ENCOUNTER — Ambulatory Visit (INDEPENDENT_AMBULATORY_CARE_PROVIDER_SITE_OTHER): Payer: Medicare Other

## 2019-09-23 DIAGNOSIS — M1712 Unilateral primary osteoarthritis, left knee: Secondary | ICD-10-CM

## 2019-09-23 DIAGNOSIS — M25562 Pain in left knee: Secondary | ICD-10-CM | POA: Diagnosis not present

## 2019-09-23 MED ORDER — BUPIVACAINE HCL 0.5 % IJ SOLN
2.0000 mL | INTRAMUSCULAR | Status: AC | PRN
  Administered 2019-09-23: 2 mL via INTRA_ARTICULAR

## 2019-09-23 MED ORDER — LIDOCAINE HCL 1 % IJ SOLN
2.0000 mL | INTRAMUSCULAR | Status: AC | PRN
  Administered 2019-09-23: 2 mL

## 2019-09-23 MED ORDER — METHYLPREDNISOLONE ACETATE 40 MG/ML IJ SUSP
40.0000 mg | INTRAMUSCULAR | Status: AC | PRN
  Administered 2019-09-23: 40 mg via INTRA_ARTICULAR

## 2019-09-23 NOTE — Progress Notes (Addendum)
Office Visit Note   Patient: Craig Vazquez           Date of Birth: 1931/02/17           MRN: [id number] Visit Date: 09/23/2019              Requested by: Wenda Low, MD [address] [place],  [place] [address] PCP: Wenda Low, MD   Assessment & Plan: Visit Diagnoses:  1. Primary osteoarthritis of left knee     Plan: Impression is end-stage left knee DJD.  Patient would like to try a cortisone injection based on our discussion of treatment options.  I have also provided him with a pamphlet on the Monovisc should he be interested in that he will let us know.  Otherwise we will see him back as needed.  Follow-Up Instructions: Return if symptoms worsen or fail to improve.   Orders:  Orders Placed This Encounter  Procedures  . XR KNEE 3 VIEW LEFT   No orders of the defined types were placed in this encounter.     Procedures: Large Joint Inj: L knee on 09/23/2019 2:05 PM Details: 22 G needle Medications: 2 mL bupivacaine 0.5 %; 2 mL lidocaine 1 %; 40 mg methylPREDNISolone acetate 40 MG/ML Outcome: tolerated well, no immediate complications Patient was prepped and draped in the usual sterile fashion.       Clinical Data: No additional findings.   Subjective: Chief Complaint  Patient presents with  . Left Knee - Pain    Craig Vazquez is a very pleasant 86 year old gentleman who comes in for evaluation of chronic left knee pain.  The pain is worse with increased activity and standing.  He does not notice much swelling.  He does pop and give way sometimes.  Fortunately he has not fallen.  Denies any numbness and tingling.   Review of Systems  Constitutional: Negative.   All other systems reviewed and are negative.    Objective: Vital Signs: There were no vitals taken for this visit.  Physical Exam Vitals and nursing note reviewed.  Constitutional:      Appearance: He is well-developed.  HENT:     Head: Normocephalic and atraumatic.  Eyes:     Pupils: Pupils are equal, round, and reactive to light.  Pulmonary:     Effort: Pulmonary effort is normal.  Abdominal:     Palpations: Abdomen is soft.  Musculoskeletal:        General: Normal range of motion.     Cervical back: Neck supple.  Skin:    General: Skin is warm.  Neurological:     Mental Status: He is alert and oriented to person, place, and time.  Psychiatric:        Behavior: Behavior normal.        Thought Content: Thought content normal.        Judgment: Judgment normal.     Ortho Exam Left knee exam shows no joint effusion.  Patellofemoral crepitus with range of motion which is mildly restricted.  Collaterals and cruciates are stable.  Specialty Comments:  No specialty comments available.  Imaging: XR KNEE 3 VIEW LEFT  Result Date: 09/23/2019 Advanced tricompartmental DJD.    PMFS History: Patient Active Problem List   Diagnosis Date Noted  . Primary osteoarthritis of left knee 09/23/2019  . Recurrent incarcerated left inguinal hernia s/p open repair 04/06/2018 04/05/2018  . Stroke of unknown etiology ([facility]) 12/12/2012  . Asthma, chronic 11/08/2012  . BPPV (  benign paroxysmal positional vertigo) 11/08/2012  . Expressive aphasia 11/07/2012  . 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 Asthma   . Diabetes mellitus without complication ([facility])    Borderline diabetic  . Hypoglycemia   . Recurrent irreducible left inguinal hernia 04/05/2018  . Stroke ([facility])    5.1.14  . Vertigo     Family History  Problem Relation Age of Onset  . Cancer Brother        lung ca x 2 brothers    Past Surgical History:  Procedure Laterality Date  . APPENDECTOMY    . CHOLECYSTECTOMY    . ESOPHAGOGASTRODUODENOSCOPY (EGD) WITH PROPOFOL N/A 08/13/2018   Procedure: ESOPHAGOGASTRODUODENOSCOPY (EGD) WITH PROPOFOL;  Surgeon: Otis Brace, MD;  Location: [facility];  Service: Gastroenterology;  Laterality: N/A;  . HAND SURGERY     rt hand  .  herina    . HERNIA REPAIR    . INGUINAL HERNIA REPAIR  04/05/2018   OPEN   . INGUINAL HERNIA REPAIR Left 04/05/2018   Procedure: OPEN REPAIR OF LEFT INGUINAL HERNIA;  Surgeon: Fanny Skates, MD;  Location: [facility];  Service: General;  Laterality: Left;  . INSERTION OF MESH Left 04/05/2018   Procedure: INSERTION OF MESH;  Surgeon: Fanny Skates, MD;  Location: [facility];  Service: General;  Laterality: Left;  . LAPAROSCOPIC GASTROTOMY W/ REPAIR OF ULCER  1970  . MOHS SURGERY  2017  . TEE WITHOUT CARDIOVERSION N/A 01/16/2013   Procedure: TRANSESOPHAGEAL ECHOCARDIOGRAM (TEE);  Surgeon: Thayer Headings, MD;  Location: [facility];  Service: Cardiovascular;  Laterality: N/A;  . TONSILLECTOMY     Social History   Occupational History  . Occupation: retired  Tobacco Use  . Smoking status: Former Smoker    Quit date: 1970    Years since quitting: 51.2  . Smokeless tobacco: Never Used  . Tobacco comment: quit 1970  Substance and Sexual Activity  . Alcohol use: Yes    Comment: BEER/WINE OCC  . Drug use: No  . Sexual activity: Not on file

## 2019-10-07 ENCOUNTER — Telehealth: Payer: Self-pay | Admitting: Orthopaedic Surgery

## 2019-10-07 NOTE — Telephone Encounter (Signed)
Okay to submit for gel inj?  

## 2019-10-07 NOTE — Telephone Encounter (Signed)
Pt called in said he is still having pain in his left knee and is wanting to proceed with the second option dr.xu gave him.    [phone]

## 2019-10-07 NOTE — Telephone Encounter (Signed)
Ok to do

## 2019-10-08 NOTE — Telephone Encounter (Signed)
Patient called again  Refer to last telephone encounter. Repeat message.   Call back: [phone]

## 2019-10-08 NOTE — Telephone Encounter (Signed)
Please submit for gel inj ?

## 2019-10-09 NOTE — Telephone Encounter (Signed)
Submitted for VOB for Monovisc-Left knee 

## 2019-10-13 ENCOUNTER — Telehealth: Payer: Self-pay

## 2019-10-13 NOTE — Telephone Encounter (Signed)
Pt called and informed of approval. Appt was scheduled

## 2019-10-13 NOTE — Telephone Encounter (Signed)
Approved for Monovisc-Left knee Buy and Bill Dr. Xu 20% OOP-once OOP met pt is covered @ 100% No prior auth required  

## 2019-10-17 ENCOUNTER — Ambulatory Visit (INDEPENDENT_AMBULATORY_CARE_PROVIDER_SITE_OTHER): Payer: Medicare Other | Admitting: Orthopaedic Surgery

## 2019-10-17 ENCOUNTER — Encounter: Payer: Self-pay | Admitting: Orthopaedic Surgery

## 2019-10-17 ENCOUNTER — Other Ambulatory Visit: Payer: Self-pay

## 2019-10-17 VITALS — Ht 70.0 in | Wt 153.0 lb

## 2019-10-17 DIAGNOSIS — M1712 Unilateral primary osteoarthritis, left knee: Secondary | ICD-10-CM

## 2019-10-17 MED ORDER — HYALURONAN 88 MG/4ML IX SOSY
88.0000 mg | PREFILLED_SYRINGE | INTRA_ARTICULAR | Status: AC | PRN
  Administered 2019-10-17: 88 mg via INTRA_ARTICULAR

## 2019-10-17 MED ORDER — LIDOCAINE HCL 1 % IJ SOLN
2.0000 mL | INTRAMUSCULAR | Status: AC | PRN
  Administered 2019-10-17: 2 mL

## 2019-10-17 MED ORDER — BUPIVACAINE HCL 0.25 % IJ SOLN
2.0000 mL | INTRAMUSCULAR | Status: AC | PRN
  Administered 2019-10-17: 2 mL via INTRA_ARTICULAR

## 2019-10-17 NOTE — Progress Notes (Signed)
   Procedure Note  Patient: Craig Vazquez             Date of Birth: 03-17-1931           MRN: [id number]             Visit Date: 10/17/2019  Procedures: Visit Diagnoses:  1. Unilateral primary osteoarthritis, left knee     Large Joint Inj: L knee on 10/17/2019 10:54 AM Indications: pain Details: 22 G needle, anterolateral approach Medications: 2 mL lidocaine 1 %; 2 mL bupivacaine 0.25 %; 88 mg Hyaluronan 88 MG/4ML

## 2019-12-18 DIAGNOSIS — H353131 Nonexudative age-related macular degeneration, bilateral, early dry stage: Secondary | ICD-10-CM | POA: Diagnosis not present

## 2019-12-18 DIAGNOSIS — H40003 Preglaucoma, unspecified, bilateral: Secondary | ICD-10-CM | POA: Diagnosis not present

## 2020-02-11 DIAGNOSIS — N400 Enlarged prostate without lower urinary tract symptoms: Secondary | ICD-10-CM | POA: Diagnosis not present

## 2020-02-11 DIAGNOSIS — F341 Dysthymic disorder: Secondary | ICD-10-CM | POA: Diagnosis not present

## 2020-02-11 DIAGNOSIS — J45909 Unspecified asthma, uncomplicated: Secondary | ICD-10-CM | POA: Diagnosis not present

## 2020-02-11 DIAGNOSIS — R7309 Other abnormal glucose: Secondary | ICD-10-CM | POA: Diagnosis not present

## 2020-02-11 DIAGNOSIS — I69359 Hemiplegia and hemiparesis following cerebral infarction affecting unspecified side: Secondary | ICD-10-CM | POA: Diagnosis not present

## 2020-02-11 DIAGNOSIS — K279 Peptic ulcer, site unspecified, unspecified as acute or chronic, without hemorrhage or perforation: Secondary | ICD-10-CM | POA: Diagnosis not present

## 2020-04-01 DIAGNOSIS — K4030 Unilateral inguinal hernia, with obstruction, without gangrene, not specified as recurrent: Secondary | ICD-10-CM | POA: Diagnosis not present

## 2020-04-01 DIAGNOSIS — Z23 Encounter for immunization: Secondary | ICD-10-CM | POA: Diagnosis not present

## 2020-04-05 ENCOUNTER — Other Ambulatory Visit: Payer: Self-pay | Admitting: Internal Medicine

## 2020-04-05 ENCOUNTER — Ambulatory Visit
Admission: RE | Admit: 2020-04-05 | Discharge: 2020-04-05 | Disposition: A | Discharge status: Home / Self Care | Payer: Medicare Other | Source: Ambulatory Visit | Attending: Internal Medicine | Admitting: Internal Medicine

## 2020-04-05 DIAGNOSIS — K5900 Constipation, unspecified: Secondary | ICD-10-CM

## 2020-04-12 ENCOUNTER — Ambulatory Visit: Payer: Self-pay | Admitting: Surgery

## 2020-04-12 DIAGNOSIS — K4090 Unilateral inguinal hernia, without obstruction or gangrene, not specified as recurrent: Secondary | ICD-10-CM | POA: Diagnosis not present

## 2020-04-12 NOTE — H&P (Signed)
Lillia [facility] Appointment: 04/12/2020 9:40 AM Location: [facility] Patient #: [id number] DOB: 08-Jul-1931 Married / Language: Cleophus Molt / Race: White Male  History of Present Illness Gluth A. Yi Falletta MD; 04/12/2020 12:24 PM) Patient words: The patient presents for evaluation of right groin bulge 3 months. The patient noted a bulge in his right groin about 3 months ago. It is not painful. He had a history of a left inguinal hernia repair by Dr. Dalbert Batman in 2015 thinks it is similar to that. He denies pain, nausea, vomiting or change in bowel or bladder function. He did have a bout of constipation recently with medication.  The patient is a 86 year old male.   Allergies (Chanel Teressa Senter, CMA; 04/12/2020 9:29 AM) No Known Drug Allergies [03/19/2018]: Allergies Reconciled  Medication History (Chanel Teressa Senter, CMA; 04/12/2020 9:29 AM) Albuterol Sulfate HFA (108 (90 Base)MCG/ACT Aerosol Soln, Inhalation) Active. Aspirin (325MG  Tablet, Oral) Active. Carafate (1GM/10ML Suspension, Oral) Active. Flonase (50MCG/DOSE Inhaler, Nasal) Active. metFORMIN HCl (500MG  Tablet, Oral) Active. Flovent Diskus (100MCG/BLIST Aero Pow Br Act, Inhalation) Active. Pulmicort (Inhalation) Specific strength unknown - Active. Medications Reconciled    Vitals (Chanel Nolan CMA; 04/12/2020 9:29 AM) 04/12/2020 9:29 AM Weight: 149.38 lb Height: 70in Body Surface Area: 1.84 m Body Mass Index: 21.43 kg/m  Temp.: 44F  Pulse: 66 (Regular)  BP: 114/70(Sitting, Left Arm, Standard)        Physical Exam (Harlis A. Abrianna Sidman MD; 04/12/2020 12:25 PM)  General Mental Status-Alert. General Appearance-Consistent with stated age. Hydration-Well hydrated. Voice-Normal.  Head and Neck Head-normocephalic, atraumatic with no lesions or palpable masses. Trachea-midline. Thyroid Gland Characteristics - normal size and consistency.  Cardiovascular Cardiovascular examination reveals  -normal heart sounds, regular rate and rhythm with no murmurs and normal pedal pulses bilaterally.  Abdomen Note: Reducible right inguinal hernia. No evidence of left inguinal hernia. Scar noted left groin.    Soft nontender  Neurologic Neurologic evaluation reveals -alert and oriented x 3 with no impairment of recent or remote memory. Mental Status-Normal.  Musculoskeletal Normal Exam - Left-Upper Extremity Strength Normal and Lower Extremity Strength Normal. Normal Exam - Right-Upper Extremity Strength Normal and Lower Extremity Strength Normal.    Assessment & Plan (Christin A. Shaughn Thomley MD; 04/12/2020 12:25 PM)  RIGHT INGUINAL HERNIA (K40.90) Impression: He is having symptoms therefore discussed open repair of his right inguinal hernia with mesh. Risks, benefits long-term expectations and alternative treatments discussed with the patient. Natural history of hernias were discussed with the patient. He has opted for repair of his right inguinal hernia with mesh. The risk of hernia repair include bleeding, infection, organ injury, bowel injury, bladder injury, nerve injury recurrent hernia, blood clots, worsening of underlying condition, chronic pain, mesh use, open surgery, death, and the need for other operattions. Pt agrees to proceed  Current Plans You are being scheduled for surgery- Our schedulers will call you.  You should hear from our office's scheduling department within 5 working days about the location, date, and time of surgery. We try to make accommodations for patient's preferences in scheduling surgery, but sometimes the OR schedule or the surgeon's schedule prevents [place] from making those accommodations.  If you have not heard from our office [phone]) in 5 working days, call the office and ask for your surgeon's nurse.  If you have other questions about your diagnosis, plan, or surgery, call the office and ask for your surgeon's nurse.  Pt Education -  Consent for inguinal hernia - [place]: discussed with patient and provided information.  Pt Education - Pamphlet Given - Hernia Surgery: discussed with patient and provided information.

## 2020-04-12 NOTE — H&P (View-Only) (Signed)
Craig Vazquez Appointment: 04/12/2020 9:40 AM Location: [facility] Patient #: [id number] DOB: [birth year] Married / Language: Cleophus Molt / Race: White Male  History of Present Illness Blyth A. Greydis Stlouis MD; 04/12/2020 12:24 PM) Patient words: The patient presents for evaluation of right groin bulge 3 months. The patient noted a bulge in his right groin about 3 months ago. It is not painful. He had a history of a left inguinal hernia repair by Dr. Dalbert Batman in 2015 thinks it is similar to that. He denies pain, nausea, vomiting or change in bowel or bladder function. He did have a bout of constipation recently with medication.  The patient is a 86 year old male.   Allergies (Chanel Teressa Senter, CMA; 04/12/2020 9:29 AM) No Known Drug Allergies [03/19/2018]: Allergies Reconciled  Medication History (Chanel Teressa Senter, CMA; 04/12/2020 9:29 AM) Albuterol Sulfate HFA (108 (90 Base)MCG/ACT Aerosol Soln, Inhalation) Active. Aspirin (325MG  Tablet, Oral) Active. Carafate (1GM/10ML Suspension, Oral) Active. Flonase (50MCG/DOSE Inhaler, Nasal) Active. metFORMIN HCl (500MG  Tablet, Oral) Active. Flovent Diskus (100MCG/BLIST Aero Pow Br Act, Inhalation) Active. Pulmicort (Inhalation) Specific strength unknown - Active. Medications Reconciled    Vitals (Chanel Nolan CMA; 04/12/2020 9:29 AM) 04/12/2020 9:29 AM Weight: 149.38 lb Height: 70in Body Surface Area: 1.84 m Body Mass Index: 21.43 kg/m  Temp.: 75F  Pulse: 66 (Regular)  BP: 114/70(Sitting, Left Arm, Standard)        Physical Exam (Randal A. Cowen Pesqueira MD; 04/12/2020 12:25 PM)  General Mental Status-Alert. General Appearance-Consistent with stated age. Hydration-Well hydrated. Voice-Normal.  Head and Neck Head-normocephalic, atraumatic with no lesions or palpable masses. Trachea-midline. Thyroid Gland Characteristics - normal size and consistency.  Cardiovascular Cardiovascular examination reveals  -normal heart sounds, regular rate and rhythm with no murmurs and normal pedal pulses bilaterally.  Abdomen Note: Reducible right inguinal hernia. No evidence of left inguinal hernia. Scar noted left groin.    Soft nontender  Neurologic Neurologic evaluation reveals -alert and oriented x 3 with no impairment of recent or remote memory. Mental Status-Normal.  Musculoskeletal Normal Exam - Left-Upper Extremity Strength Normal and Lower Extremity Strength Normal. Normal Exam - Right-Upper Extremity Strength Normal and Lower Extremity Strength Normal.    Assessment & Plan (Therron A. Beatryce Colombo MD; 04/12/2020 12:25 PM)  RIGHT INGUINAL HERNIA (K40.90) Impression: He is having symptoms therefore discussed open repair of his right inguinal hernia with mesh. Risks, benefits long-term expectations and alternative treatments discussed with the patient. Natural history of hernias were discussed with the patient. He has opted for repair of his right inguinal hernia with mesh. The risk of hernia repair include bleeding, infection, organ injury, bowel injury, bladder injury, nerve injury recurrent hernia, blood clots, worsening of underlying condition, chronic pain, mesh use, open surgery, death, and the need for other operattions. Pt agrees to proceed  Current Plans You are being scheduled for surgery- Our schedulers will call you.  You should hear from our office's scheduling department within 5 working days about the location, date, and time of surgery. We try to make accommodations for patient's preferences in scheduling surgery, but sometimes the OR schedule or the surgeon's schedule prevents [place] from making those accommodations.  If you have not heard from our office [phone]) in 5 working days, call the office and ask for your surgeon's nurse.  If you have other questions about your diagnosis, plan, or surgery, call the office and ask for your surgeon's nurse.  Pt Education -  Consent for inguinal hernia - [place]: discussed with patient and provided information.  Pt Education - Pamphlet Given - Hernia Surgery: discussed with patient and provided information.

## 2020-05-05 NOTE — Progress Notes (Addendum)
[id number] - [place], [place] - [address] [place] [address] Phone: [phone] Fax: [phone]      Your procedure is scheduled on Tuesday, May 11, 2020.  Report to [facility] Entrance "A" at 1:00 P.M., and check in at the Admitting office.    Call this number if you have problems the morning of surgery:  [phone]   Call [phone] if you have any questions prior to your surgery date Monday-Friday 8am-4pm    Remember:  Do not eat after midnight the night before your surgery  You may drink clear liquids until 12:00pm the afternoon of your surgery.   Clear liquids allowed are: Water, Non-Citrus Juices (without pulp), Carbonated Beverages, Clear Tea, Black Coffee Only (NO creamer or dairy), and Gatorade    Take these medicines the morning of surgery with A SIP OF WATER: Pantoprazole (Protonix)  If needed you may take the following: Fluticasone (Flonase) nasal spray Fluticasone Propionate (Flovent diskus) inhaler Propylene Glycol (Systane Complete) eye drop  Follow your surgeon's instructions on when to stop Aspirin.  If no instructions were given by your surgeon then you will need to call the office to get those instructions.    As of today, STOP taking any Aspirin (unless otherwise instructed by your surgeon) Aleve, Naproxen, Ibuprofen, Motrin, Advil, Goody's, BC's, all herbal medications, fish oil, and all vitamins.   HOW TO MANAGE YOUR DIABETES BEFORE AND AFTER SURGERY  Why is it important to control my blood sugar before and after surgery? . Improving blood sugar levels before and after surgery helps healing and can limit problems. . A way of improving blood sugar control is eating a healthy diet by: o  Eating less sugar and carbohydrates o  Increasing activity/exercise o  Talking with your doctor about reaching your blood sugar goals . High blood sugars  (greater than 180 mg/dL) can raise your risk of infections and slow your recovery, so you will need to focus on controlling your diabetes during the weeks before surgery. . Make sure that the doctor who takes care of your diabetes knows about your planned surgery including the date and location.  How do I manage my blood sugar before surgery? . Check your blood sugar at least 4 times a day, starting 2 days before surgery, to make sure that the level is not too high or low. . Check your blood sugar the morning of your surgery when you wake up and every 2 hours until you get to the Short Stay unit. o If your blood sugar is less than 70 mg/dL, you will need to treat for low blood sugar: - Do not take insulin. - Treat a low blood sugar (less than 70 mg/dL) with  cup of clear juice (cranberry or apple), 4 glucose tablets, OR glucose gel. - Recheck blood sugar in 15 minutes after treatment (to make sure it is greater than 70 mg/dL). If your blood sugar is not greater than 70 mg/dL on recheck, call [phone] for further instructions. . Report your blood sugar to the short stay nurse when you get to Short Stay.  . If you are admitted to the hospital after surgery: o Your blood sugar will be checked by the staff and you will probably be given insulin after surgery (instead of oral diabetes medicines) to make sure you have good blood sugar levels. o The goal for blood sugar control after surgery is  80-180 mg/dL.                     Do not wear jewelry            Do not wear lotions, powders, colognes, or deodorant.            Do not shave 48 hours prior to surgery.  Men may shave face and neck.            Do not bring valuables to the hospital.            [facility] is not responsible for any belongings or valuables.  Do NOT Smoke (Tobacco/Vaping) or drink Alcohol 24 hours prior to your procedure  If you use a CPAP at night, you may bring all equipment for your overnight stay.   Contacts,  glasses, dentures or bridgework may not be worn into surgery.      For patients admitted to the hospital, discharge time will be determined by your treatment team.   Patients discharged the day of surgery will not be allowed to drive home, and someone needs to stay with them for 24 hours.    Special instructions:   [facility]- Preparing For Surgery  Before surgery, you can play an important role. Because skin is not sterile, your skin needs to be as free of germs as possible. You can reduce the number of germs on your skin by washing with CHG (chlorahexidine gluconate) Soap before surgery.  CHG is an antiseptic cleaner which kills germs and bonds with the skin to continue killing germs even after washing.    Oral Hygiene is also important to reduce your risk of infection.  Remember - BRUSH YOUR TEETH THE MORNING OF SURGERY WITH YOUR REGULAR TOOTHPASTE  Please do not use if you have an allergy to CHG or antibacterial soaps. If your skin becomes reddened/irritated stop using the CHG.  Do not shave (including legs and underarms) for at least 48 hours prior to first CHG shower. It is OK to shave your face.  Please follow these instructions carefully.   1. Shower the NIGHT BEFORE SURGERY and the MORNING OF SURGERY with CHG Soap.   2. If you chose to wash your hair, wash your hair first as usual with your normal shampoo.  3. After you shampoo, rinse your hair and body thoroughly to remove the shampoo.  4. Use CHG as you would any other liquid soap. You can apply CHG directly to the skin and wash gently with a scrungie or a clean washcloth.   5. Apply the CHG Soap to your body ONLY FROM THE NECK DOWN.  Do not use on open wounds or open sores. Avoid contact with your eyes, ears, mouth and genitals (private parts). Wash Face and genitals (private parts)  with your normal soap.   6. Wash thoroughly, paying special attention to the area where your surgery will be performed.  7. Thoroughly rinse  your body with warm water from the neck down.  8. DO NOT shower/wash with your normal soap after using and rinsing off the CHG Soap.  9. Pat yourself dry with a CLEAN TOWEL.  10. Wear CLEAN PAJAMAS to bed the night before surgery  11. Place CLEAN SHEETS on your bed the night of your first shower and DO NOT SLEEP WITH PETS.   Day of Surgery: Shower with CHG soap as directed Wear Clean/Comfortable clothing the morning of surgery Do not apply any deodorants/lotions.  Remember to brush your teeth WITH YOUR REGULAR TOOTHPASTE.   Please read over the following fact sheets that you were given.

## 2020-05-06 ENCOUNTER — Encounter (HOSPITAL_COMMUNITY): Payer: Self-pay

## 2020-05-06 ENCOUNTER — Other Ambulatory Visit: Payer: Self-pay

## 2020-05-06 ENCOUNTER — Encounter (HOSPITAL_COMMUNITY)
Admission: RE | Admit: 2020-05-06 | Discharge: 2020-05-06 | Disposition: A | Discharge status: Home / Self Care | Payer: Medicare Other | Source: Ambulatory Visit | Attending: Surgery | Admitting: Surgery

## 2020-05-06 DIAGNOSIS — Z01812 Encounter for preprocedural laboratory examination: Secondary | ICD-10-CM | POA: Diagnosis not present

## 2020-05-06 LAB — CBC WITH DIFFERENTIAL/PLATELET
Abs Immature Granulocytes: 0.03 10*3/uL (ref 0.00–0.07)
Basophils Absolute: 0.1 10*3/uL (ref 0.0–0.1)
Basophils Relative: 1 %
Eosinophils Absolute: 0.2 10*3/uL (ref 0.0–0.5)
Eosinophils Relative: 3 %
HCT: 33 % — ABNORMAL LOW (ref 39.0–52.0)
Hemoglobin: 10 g/dL — ABNORMAL LOW (ref 13.0–17.0)
Immature Granulocytes: 1 %
Lymphocytes Relative: 15 %
Lymphs Abs: 1 10*3/uL (ref 0.7–4.0)
MCH: 25.1 pg — ABNORMAL LOW (ref 26.0–34.0)
MCHC: 30.3 g/dL (ref 30.0–36.0)
MCV: 82.9 fL (ref 80.0–100.0)
Monocytes Absolute: 0.5 10*3/uL (ref 0.1–1.0)
Monocytes Relative: 8 %
Neutro Abs: 4.7 10*3/uL (ref 1.7–7.7)
Neutrophils Relative %: 72 %
Platelets: 302 10*3/uL (ref 150–400)
RBC: 3.98 MIL/uL — ABNORMAL LOW (ref 4.22–5.81)
RDW: 15.9 % — ABNORMAL HIGH (ref 11.5–15.5)
WBC: 6.5 10*3/uL (ref 4.0–10.5)
nRBC: 0 % (ref 0.0–0.2)

## 2020-05-06 LAB — COMPREHENSIVE METABOLIC PANEL
ALT: 13 U/L (ref 0–44)
AST: 17 U/L (ref 15–41)
Albumin: 3.9 g/dL (ref 3.5–5.0)
Alkaline Phosphatase: 48 U/L (ref 38–126)
Anion gap: 9 (ref 5–15)
BUN: 20 mg/dL (ref 8–23)
CO2: 27 mmol/L (ref 22–32)
Calcium: 9 mg/dL (ref 8.9–10.3)
Chloride: 101 mmol/L (ref 98–111)
Creatinine, Ser: 1.49 mg/dL — ABNORMAL HIGH (ref 0.61–1.24)
GFR, Estimated: 45 mL/min — ABNORMAL LOW (ref >60)
Glucose, Bld: 95 mg/dL (ref 70–99)
Potassium: 4.5 mmol/L (ref 3.5–5.1)
Sodium: 137 mmol/L (ref 135–145)
Total Bilirubin: 0.4 mg/dL (ref 0.3–1.2)
Total Protein: 6.6 g/dL (ref 6.5–8.1)

## 2020-05-06 NOTE — Progress Notes (Signed)
PCP - Dr. Wenda Low Cardiologist - Denies  Chest x-ray - Not indicated EKG - Not indicated Stress Test - Denies ECHO - Denies Cardiac Cath - Denies  Sleep Study - Denies  DM - Patient states "I am not diabetic"  Dr. Evelina Bucy you off diabetic medications and does not check his blood sugars.   Aspirin Instructions: No instructions given asked patient to call Dr. Brantley Stage for instruction on when to stop.   ERAS Protcol - Yes  COVID TEST- 05/07/20  Anesthesia review: No   Patient denies shortness of breath, fever, cough and chest pain at PAT appointment   All instructions explained to the patient, with a verbal understanding of the material. Patient agrees to go over the instructions while at home for a better understanding. Patient also instructed to self quarantine after being tested for COVID-19. The opportunity to ask questions was provided.

## 2020-05-07 ENCOUNTER — Other Ambulatory Visit (HOSPITAL_COMMUNITY)
Admission: RE | Admit: 2020-05-07 | Discharge: 2020-05-07 | Disposition: A | Discharge status: Home / Self Care | Payer: Medicare Other | Source: Ambulatory Visit | Attending: Surgery | Admitting: Surgery

## 2020-05-07 DIAGNOSIS — Z20822 Contact with and (suspected) exposure to covid-19: Secondary | ICD-10-CM | POA: Diagnosis not present

## 2020-05-07 DIAGNOSIS — Z01812 Encounter for preprocedural laboratory examination: Secondary | ICD-10-CM | POA: Diagnosis not present

## 2020-05-07 LAB — HEMOGLOBIN A1C
Hgb A1c MFr Bld: 6.5 % — ABNORMAL HIGH (ref 4.8–5.6)
Mean Plasma Glucose: 140 mg/dL

## 2020-05-08 LAB — SARS CORONAVIRUS 2 (TAT 6-24 HRS): SARS Coronavirus 2: NEGATIVE

## 2020-05-11 ENCOUNTER — Encounter (HOSPITAL_COMMUNITY)
Admission: RE | Disposition: A | Discharge status: Home / Self Care | Payer: Self-pay | Source: Home / Self Care | Attending: Surgery

## 2020-05-11 ENCOUNTER — Observation Stay (HOSPITAL_COMMUNITY)
Admission: RE | Admit: 2020-05-11 | Discharge: 2020-05-12 | Disposition: A | Discharge status: Home / Self Care | Payer: Medicare Other | Attending: Surgery | Admitting: Surgery

## 2020-05-11 ENCOUNTER — Ambulatory Visit (HOSPITAL_COMMUNITY): Payer: Medicare Other | Admitting: Anesthesiology

## 2020-05-11 ENCOUNTER — Encounter (HOSPITAL_COMMUNITY): Payer: Self-pay | Admitting: Surgery

## 2020-05-11 ENCOUNTER — Other Ambulatory Visit: Payer: Self-pay

## 2020-05-11 DIAGNOSIS — Z7982 Long term (current) use of aspirin: Secondary | ICD-10-CM | POA: Insufficient documentation

## 2020-05-11 DIAGNOSIS — M1712 Unilateral primary osteoarthritis, left knee: Secondary | ICD-10-CM | POA: Diagnosis not present

## 2020-05-11 DIAGNOSIS — R1909 Other intra-abdominal and pelvic swelling, mass and lump: Secondary | ICD-10-CM | POA: Diagnosis present

## 2020-05-11 DIAGNOSIS — K4031 Unilateral inguinal hernia, with obstruction, without gangrene, recurrent: Secondary | ICD-10-CM | POA: Diagnosis not present

## 2020-05-11 DIAGNOSIS — J449 Chronic obstructive pulmonary disease, unspecified: Secondary | ICD-10-CM | POA: Diagnosis not present

## 2020-05-11 DIAGNOSIS — K4090 Unilateral inguinal hernia, without obstruction or gangrene, not specified as recurrent: Principal | ICD-10-CM | POA: Insufficient documentation

## 2020-05-11 DIAGNOSIS — G8918 Other acute postprocedural pain: Secondary | ICD-10-CM | POA: Diagnosis not present

## 2020-05-11 HISTORY — PX: INGUINAL HERNIA REPAIR: SHX194

## 2020-05-11 HISTORY — PX: INSERTION OF MESH: SHX5868

## 2020-05-11 LAB — GLUCOSE, CAPILLARY
Glucose-Capillary: 96 mg/dL (ref 70–99)
Glucose-Capillary: 142 mg/dL — ABNORMAL HIGH (ref 70–99)

## 2020-05-11 SURGERY — REPAIR, HERNIA, INGUINAL, ADULT
Anesthesia: Regional | Site: Inguinal | Laterality: Right

## 2020-05-11 MED ORDER — LIDOCAINE 2% (20 MG/ML) 5 ML SYRINGE
INTRAMUSCULAR | Status: DC | PRN
  Administered 2020-05-11: 60 mg via INTRAVENOUS

## 2020-05-11 MED ORDER — CHLORHEXIDINE GLUCONATE CLOTH 2 % EX PADS: 6.0000 | MEDICATED_PAD | Freq: Once | CUTANEOUS | Status: DC

## 2020-05-11 MED ORDER — DEXAMETHASONE SODIUM PHOSPHATE 10 MG/ML IJ SOLN
INTRAMUSCULAR | Status: AC
  Filled 2020-05-11: qty 1

## 2020-05-11 MED ORDER — PROPOFOL 10 MG/ML IV BOLUS
INTRAVENOUS | Status: AC
  Filled 2020-05-11: qty 20

## 2020-05-11 MED ORDER — FENTANYL CITRATE (PF) 100 MCG/2ML IJ SOLN
INTRAMUSCULAR | Status: AC
  Administered 2020-05-11: 50 ug via INTRAVENOUS
  Filled 2020-05-11: qty 2

## 2020-05-11 MED ORDER — DEXAMETHASONE SODIUM PHOSPHATE 10 MG/ML IJ SOLN
INTRAMUSCULAR | Status: DC | PRN
  Administered 2020-05-11: 5 mg via INTRAVENOUS

## 2020-05-11 MED ORDER — ROCURONIUM BROMIDE 10 MG/ML (PF) SYRINGE
PREFILLED_SYRINGE | INTRAVENOUS | Status: DC | PRN
  Administered 2020-05-11: 30 mg via INTRAVENOUS

## 2020-05-11 MED ORDER — ONDANSETRON HCL 4 MG/2ML IJ SOLN
INTRAMUSCULAR | Status: DC | PRN
  Administered 2020-05-11: 4 mg via INTRAVENOUS

## 2020-05-11 MED ORDER — ROCURONIUM BROMIDE 10 MG/ML (PF) SYRINGE
PREFILLED_SYRINGE | INTRAVENOUS | Status: AC
  Filled 2020-05-11: qty 10

## 2020-05-11 MED ORDER — ONDANSETRON HCL 4 MG/2ML IJ SOLN: 4.0000 mg | Freq: Four times a day (QID) | INTRAMUSCULAR | Status: DC | PRN

## 2020-05-11 MED ORDER — BUPIVACAINE HCL 0.25 % IJ SOLN
INTRAMUSCULAR | Status: DC | PRN
  Administered 2020-05-11: 7 mL

## 2020-05-11 MED ORDER — ALBUTEROL SULFATE HFA 108 (90 BASE) MCG/ACT IN AERS
1.0000 | INHALATION_SPRAY | RESPIRATORY_TRACT | Status: DC | PRN
  Filled 2020-05-11: qty 6.7

## 2020-05-11 MED ORDER — PROPOFOL 10 MG/ML IV BOLUS
INTRAVENOUS | Status: DC | PRN
  Administered 2020-05-11: 100 mg via INTRAVENOUS

## 2020-05-11 MED ORDER — PHENYLEPHRINE 40 MCG/ML (10ML) SYRINGE FOR IV PUSH (FOR BLOOD PRESSURE SUPPORT)
PREFILLED_SYRINGE | INTRAVENOUS | Status: DC | PRN
  Administered 2020-05-11 (×2): 40 ug via INTRAVENOUS
  Administered 2020-05-11 (×5): 80 ug via INTRAVENOUS

## 2020-05-11 MED ORDER — FENTANYL CITRATE (PF) 250 MCG/5ML IJ SOLN
INTRAMUSCULAR | Status: DC | PRN
  Administered 2020-05-11: 100 ug via INTRAVENOUS

## 2020-05-11 MED ORDER — FENTANYL CITRATE (PF) 100 MCG/2ML IJ SOLN: 50.0000 ug | INTRAMUSCULAR | Status: DC | PRN

## 2020-05-11 MED ORDER — LACTATED RINGERS IV SOLN: INTRAVENOUS | Status: DC

## 2020-05-11 MED ORDER — ACETAMINOPHEN 500 MG PO TABS: 1000.0000 mg | ORAL_TABLET | ORAL | Status: AC

## 2020-05-11 MED ORDER — PANTOPRAZOLE SODIUM 40 MG PO TBEC
40.0000 mg | DELAYED_RELEASE_TABLET | Freq: Every day | ORAL | Status: DC
  Administered 2020-05-12: 40 mg via ORAL
  Filled 2020-05-11: qty 1

## 2020-05-11 MED ORDER — 0.9 % SODIUM CHLORIDE (POUR BTL) OPTIME
TOPICAL | Status: DC | PRN
  Administered 2020-05-11: 1000 mL

## 2020-05-11 MED ORDER — METOPROLOL TARTRATE 5 MG/5ML IV SOLN: 5.0000 mg | Freq: Four times a day (QID) | INTRAVENOUS | Status: DC | PRN

## 2020-05-11 MED ORDER — ENOXAPARIN SODIUM 40 MG/0.4ML ~~LOC~~ SOLN
40.0000 mg | SUBCUTANEOUS | Status: DC
  Administered 2020-05-12: 40 mg via SUBCUTANEOUS
  Filled 2020-05-11 (×2): qty 0.4

## 2020-05-11 MED ORDER — MIDAZOLAM HCL 2 MG/2ML IJ SOLN
INTRAMUSCULAR | Status: AC
  Filled 2020-05-11: qty 2

## 2020-05-11 MED ORDER — ACETAMINOPHEN 500 MG PO TABS
1000.0000 mg | ORAL_TABLET | Freq: Four times a day (QID) | ORAL | Status: DC
  Filled 2020-05-11 (×3): qty 2

## 2020-05-11 MED ORDER — DEXTROSE-NACL 5-0.9 % IV SOLN: INTRAVENOUS | Status: DC

## 2020-05-11 MED ORDER — PHENYLEPHRINE 40 MCG/ML (10ML) SYRINGE FOR IV PUSH (FOR BLOOD PRESSURE SUPPORT)
PREFILLED_SYRINGE | INTRAVENOUS | Status: AC
  Filled 2020-05-11: qty 10

## 2020-05-11 MED ORDER — BUPIVACAINE HCL (PF) 0.25 % IJ SOLN
INTRAMUSCULAR | Status: AC
  Filled 2020-05-11: qty 30

## 2020-05-11 MED ORDER — POLYVINYL ALCOHOL 1.4 % OP SOLN
1.0000 [drp] | OPHTHALMIC | Status: DC | PRN
  Filled 2020-05-11: qty 15

## 2020-05-11 MED ORDER — FENTANYL CITRATE (PF) 250 MCG/5ML IJ SOLN
INTRAMUSCULAR | Status: AC
  Filled 2020-05-11: qty 5

## 2020-05-11 MED ORDER — CHLORHEXIDINE GLUCONATE 0.12 % MT SOLN: 15.0000 mL | Freq: Once | OROMUCOSAL | Status: AC

## 2020-05-11 MED ORDER — FENTANYL CITRATE (PF) 100 MCG/2ML IJ SOLN: 25.0000 ug | INTRAMUSCULAR | Status: DC | PRN

## 2020-05-11 MED ORDER — CEFAZOLIN SODIUM-DEXTROSE 2-4 GM/100ML-% IV SOLN
INTRAVENOUS | Status: AC
  Filled 2020-05-11: qty 100

## 2020-05-11 MED ORDER — LIDOCAINE 2% (20 MG/ML) 5 ML SYRINGE
INTRAMUSCULAR | Status: AC
  Filled 2020-05-11: qty 5

## 2020-05-11 MED ORDER — DIPHENHYDRAMINE HCL 12.5 MG/5ML PO ELIX: 12.5000 mg | ORAL_SOLUTION | Freq: Four times a day (QID) | ORAL | Status: DC | PRN

## 2020-05-11 MED ORDER — CHLORHEXIDINE GLUCONATE 0.12 % MT SOLN
OROMUCOSAL | Status: AC
  Administered 2020-05-11: 15 mL via OROMUCOSAL
  Filled 2020-05-11: qty 15

## 2020-05-11 MED ORDER — ESMOLOL HCL 100 MG/10ML IV SOLN
INTRAVENOUS | Status: AC
  Filled 2020-05-11: qty 10

## 2020-05-11 MED ORDER — ACETAMINOPHEN 500 MG PO TABS
ORAL_TABLET | ORAL | Status: AC
  Administered 2020-05-11: 1000 mg via ORAL
  Filled 2020-05-11: qty 2

## 2020-05-11 MED ORDER — TRAMADOL HCL 50 MG PO TABS: 50.0000 mg | ORAL_TABLET | Freq: Four times a day (QID) | ORAL | Status: DC | PRN

## 2020-05-11 MED ORDER — ORAL CARE MOUTH RINSE: 15.0000 mL | Freq: Once | OROMUCOSAL | Status: AC

## 2020-05-11 MED ORDER — ONDANSETRON 4 MG PO TBDP: 4.0000 mg | ORAL_TABLET | Freq: Four times a day (QID) | ORAL | Status: DC | PRN

## 2020-05-11 MED ORDER — METHOCARBAMOL 500 MG PO TABS: 500.0000 mg | ORAL_TABLET | Freq: Four times a day (QID) | ORAL | Status: DC | PRN

## 2020-05-11 MED ORDER — ESMOLOL HCL 100 MG/10ML IV SOLN
INTRAVENOUS | Status: DC | PRN
  Administered 2020-05-11: 20 mg via INTRAVENOUS
  Administered 2020-05-11: 30 mg via INTRAVENOUS

## 2020-05-11 MED ORDER — POLYETHYLENE GLYCOL 3350 17 G PO PACK: 17.0000 g | PACK | Freq: Every day | ORAL | Status: DC | PRN

## 2020-05-11 MED ORDER — HYDROCODONE-ACETAMINOPHEN 5-325 MG PO TABS: 1.0000 | ORAL_TABLET | ORAL | Status: DC | PRN

## 2020-05-11 MED ORDER — ONDANSETRON HCL 4 MG/2ML IJ SOLN
INTRAMUSCULAR | Status: AC
  Filled 2020-05-11: qty 2

## 2020-05-11 MED ORDER — ONDANSETRON HCL 4 MG/2ML IJ SOLN: 4.0000 mg | Freq: Once | INTRAMUSCULAR | Status: DC | PRN

## 2020-05-11 MED ORDER — FENTANYL CITRATE (PF) 100 MCG/2ML IJ SOLN: 50.0000 ug | Freq: Once | INTRAMUSCULAR | Status: AC

## 2020-05-11 MED ORDER — DIPHENHYDRAMINE HCL 50 MG/ML IJ SOLN: 12.5000 mg | Freq: Four times a day (QID) | INTRAMUSCULAR | Status: DC | PRN

## 2020-05-11 MED ORDER — SUGAMMADEX SODIUM 200 MG/2ML IV SOLN
INTRAVENOUS | Status: DC | PRN
  Administered 2020-05-11: 200 mg via INTRAVENOUS

## 2020-05-11 MED ORDER — CEFAZOLIN SODIUM-DEXTROSE 2-4 GM/100ML-% IV SOLN
2.0000 g | INTRAVENOUS | Status: AC
  Administered 2020-05-11: 2 g via INTRAVENOUS

## 2020-05-11 MED ORDER — PROPYLENE GLYCOL 0.6 % OP SOLN: 1.0000 [drp] | Freq: Every day | OPHTHALMIC | Status: DC | PRN

## 2020-05-11 SURGICAL SUPPLY — 39 items
ADH SKN CLS APL DERMABOND .7 (GAUZE/BANDAGES/DRESSINGS) ×1
APL PRP STRL LF DISP 70% ISPRP (MISCELLANEOUS) ×1
BLADE CLIPPER SURG (BLADE) IMPLANT
CANISTER SUCT 3000ML PPV (MISCELLANEOUS) IMPLANT
CHLORAPREP W/TINT 26 (MISCELLANEOUS) ×2 IMPLANT
COVER SURGICAL LIGHT HANDLE (MISCELLANEOUS) ×2 IMPLANT
DERMABOND ADVANCED (GAUZE/BANDAGES/DRESSINGS) ×1
DERMABOND ADVANCED .7 DNX12 (GAUZE/BANDAGES/DRESSINGS) IMPLANT
DRAIN PENROSE 1/2X12 LTX STRL (WOUND CARE) IMPLANT
DRAPE LAPAROTOMY TRNSV 102X78 (DRAPES) ×2 IMPLANT
ELECT REM PT RETURN 9FT ADLT (ELECTROSURGICAL) ×2
ELECTRODE REM PT RTRN 9FT ADLT (ELECTROSURGICAL) ×1 IMPLANT
GLOVE BIO SURGEON STRL SZ8 (GLOVE) ×2 IMPLANT
GLOVE BIOGEL PI IND STRL 8 (GLOVE) ×1 IMPLANT
GLOVE BIOGEL PI INDICATOR 8 (GLOVE) ×1
GOWN STRL REUS W/ TWL LRG LVL3 (GOWN DISPOSABLE) ×1 IMPLANT
GOWN STRL REUS W/ TWL XL LVL3 (GOWN DISPOSABLE) ×1 IMPLANT
GOWN STRL REUS W/TWL LRG LVL3 (GOWN DISPOSABLE) ×2
GOWN STRL REUS W/TWL XL LVL3 (GOWN DISPOSABLE) ×2
KIT BASIN OR (CUSTOM PROCEDURE TRAY) ×2 IMPLANT
KIT TURNOVER KIT B (KITS) ×2 IMPLANT
MESH HERNIA SYS ULTRAPRO LRG (Mesh General) ×1 IMPLANT
NDL HYPO 25GX1X1/2 BEV (NEEDLE) ×1 IMPLANT
NEEDLE HYPO 25GX1X1/2 BEV (NEEDLE) ×2 IMPLANT
NS IRRIG 1000ML POUR BTL (IV SOLUTION) ×2 IMPLANT
PACK GENERAL/GYN (CUSTOM PROCEDURE TRAY) ×2 IMPLANT
PAD ARMBOARD 7.5X6 YLW CONV (MISCELLANEOUS) ×2 IMPLANT
PENCIL SMOKE EVACUATOR (MISCELLANEOUS) ×2 IMPLANT
SUT MNCRL AB 4-0 PS2 18 (SUTURE) ×2 IMPLANT
SUT NOVA NAB DX-16 0-1 5-0 T12 (SUTURE) ×4 IMPLANT
SUT SILK 2 0 SH (SUTURE) IMPLANT
SUT VIC AB 0 CT1 27 (SUTURE)
SUT VIC AB 0 CT1 27XBRD ANBCTR (SUTURE) IMPLANT
SUT VIC AB 2-0 SH 27 (SUTURE) ×2
SUT VIC AB 2-0 SH 27X BRD (SUTURE) ×1 IMPLANT
SUT VIC AB 3-0 SH 18 (SUTURE) ×2 IMPLANT
SUT VICRYL AB 3 0 TIES (SUTURE) ×2 IMPLANT
SYR CONTROL 10ML LL (SYRINGE) ×2 IMPLANT
TOWEL GREEN STERILE FF (TOWEL DISPOSABLE) ×2 IMPLANT

## 2020-05-11 NOTE — Interval H&P Note (Signed)
History and Physical Interval Note:  05/11/2020 1:30 PM  Craig Vazquez  has presented today for surgery, with the diagnosis of RIGHT INGUINAL HERNIA.  The various methods of treatment have been discussed with the patient and family. After consideration of risks, benefits and other options for treatment, the patient has consented to  Procedure(s): REPAIR OF RIGHT INGUINAL HERNIA WITH MESH (Right) as a surgical intervention.  The patient's history has been reviewed, patient examined, no change in status, stable for surgery.  I have reviewed the patient's chart and labs.  Questions were answered to the patient's satisfaction.     Turner Daniels MD

## 2020-05-11 NOTE — Discharge Instructions (Signed)
[facility] _______[facility] ° °UMBILICAL OR INGUINAL HERNIA REPAIR: POST OP INSTRUCTIONS ° °Always review your discharge instruction sheet given to you by the facility where your surgery was performed. °IF YOU HAVE DISABILITY OR FAMILY LEAVE FORMS, YOU MUST BRING THEM TO THE OFFICE FOR PROCESSING.   °DO NOT GIVE THEM TO YOUR DOCTOR. ° °1. A  prescription for pain medication may be given to you upon discharge.  Take your pain medication as prescribed, if needed.  If narcotic pain medicine is not needed, then you may take acetaminophen (Tylenol) or ibuprofen (Advil) as needed. °2. Take your usually prescribed medications unless otherwise directed. °If you need a refill on your pain medication, please contact your pharmacy.  They will contact our office to request authorization. Prescriptions will not be filled after 5 pm or on week-ends. °3. You should follow a light diet the first 24 hours after arrival home, such as soup and crackers, etc.  Be sure to include lots of fluids daily.  Resume your normal diet the day after surgery. °4.Most patients will experience some swelling and bruising around the umbilicus or in the groin and scrotum.  Ice packs and reclining will help.  Swelling and bruising can take several days to resolve.  °6. It is common to experience some constipation if taking pain medication after surgery.  Increasing fluid intake and taking a stool softener (such as Colace) will usually help or prevent this problem from occurring.  A mild laxative (Milk of Magnesia or Miralax) should be taken according to package directions if there are no bowel movements after 48 hours. °7. Unless discharge instructions indicate otherwise, you may remove your bandages 24-48 hours after surgery, and you may shower at that time.  You may have steri-strips (small skin tapes) in place directly over the incision.  These strips should be left on the skin for 7-10 days.  If your surgeon used skin glue on the  incision, you may shower in 24 hours.  The glue will flake off over the next 2-3 weeks.  Any sutures or staples will be removed at the office during your follow-up visit. °8. ACTIVITIES:  You may resume regular (light) daily activities beginning the next day--such as daily self-care, walking, climbing stairs--gradually increasing activities as tolerated.  You may have sexual intercourse when it is comfortable.  Refrain from any heavy lifting or straining until approved by your doctor. ° °a.You may drive when you are no longer taking prescription pain medication, you can comfortably wear a seatbelt, and you can safely maneuver your car and apply brakes. °b.RETURN TO WORK:   °_____________________________________________ ° °9.You should see your doctor in the office for a follow-up appointment approximately 2-3 weeks after your surgery.  Make sure that you call for this appointment within a day or two after you arrive home to insure a convenient appointment time. °10.OTHER INSTRUCTIONS: _________________________ °   _____________________________________ ° °WHEN TO CALL YOUR DOCTOR: °1. Fever over 101.0 °2. Inability to urinate °3. Nausea and/or vomiting °4. Extreme swelling or bruising °5. Continued bleeding from incision. °6. Increased pain, redness, or drainage from the incision ° °The clinic staff is available to answer your questions during regular business hours.  Please don’t hesitate to call and ask to speak to one of the nurses for clinical concerns.  If you have a medical emergency, go to the nearest emergency room or call 911.  A surgeon from [facility] is always on call at the hospital ° ° °  [address], [address], [place], [place]  [address] ? ° [address] [address], [place], [place]   [address] °[phone] ? [phone] ? FAX [phone] °Web site: [email or web address] °

## 2020-05-11 NOTE — Anesthesia Preprocedure Evaluation (Addendum)
Anesthesia Evaluation  Patient identified by MRN, date of birth, ID bandGeneral Assessment Comment:Patient awake  Reviewed: Allergy & Precautions, NPO status , Patient's Chart, lab work & pertinent test results  Airway Mallampati: III  TM Distance: >3 FB Neck ROM: Full    Dental no notable dental hx.    Pulmonary asthma , former smoker,    Pulmonary exam normal breath sounds clear to auscultation       Cardiovascular negative cardio ROS Normal cardiovascular exam Rhythm:Regular Rate:Normal     Neuro/Psych Vertigo CVA, No Residual Symptoms negative psych ROS   GI/Hepatic Neg liver ROS, hiatal hernia,   Endo/Other  diabetes  Renal/GU negative Renal ROS     Musculoskeletal  (+) Arthritis ,   Abdominal   Peds  Hematology  (+) anemia ,   Anesthesia Other Findings RIGHT INGUINAL HERNIA  Reproductive/Obstetrics                            Anesthesia Physical Anesthesia Plan  ASA: II  Anesthesia Plan: General and Regional   Post-op Pain Management: GA combined w/ Regional for post-op pain   Induction:   PONV Risk Score and Plan: 3 and Ondansetron, Dexamethasone and Treatment may vary due to age or medical condition  Airway Management Planned: Oral ETT  Additional Equipment:   Intra-op Plan:   Post-operative Plan: Extubation in OR  Informed Consent: I have reviewed the patients History and Physical, chart, labs and discussed the procedure including the risks, benefits and alternatives for the proposed anesthesia with the patient or authorized representative who has indicated his/her understanding and acceptance.     Dental advisory given  Plan Discussed with: CRNA  Anesthesia Plan Comments:        Anesthesia Quick Evaluation

## 2020-05-11 NOTE — Anesthesia Postprocedure Evaluation (Signed)
Anesthesia Post Note  Patient: EXANDER SHAUL  Procedure(s) Performed: REPAIR OF RIGHT INGUINAL HERNIA WITH MESH (Right Inguinal) INSERTION OF MESH (Right Inguinal)     Patient location during evaluation: PACU Anesthesia Type: Regional Level of consciousness: awake and alert Pain management: pain level controlled Vital Signs Assessment: post-procedure vital signs reviewed and stable Respiratory status: spontaneous breathing, nonlabored ventilation, respiratory function stable and patient connected to nasal cannula oxygen Cardiovascular status: blood pressure returned to baseline and stable Postop Assessment: no apparent nausea or vomiting Anesthetic complications: no   No complications documented.  Last Vitals:  Vitals:   05/11/20 1545 05/11/20 1600  BP: 139/66 (!) 131/59  Pulse: 78 82  Resp: 10 14  Temp:  36.6 C  SpO2: 96% 99%    Last Pain:  Vitals:   05/11/20 1600  TempSrc:   PainSc: 0-No pain                 Sabiha Sura DAVID

## 2020-05-11 NOTE — Anesthesia Procedure Notes (Signed)
Anesthesia Regional Block: TAP block   Pre-Anesthetic Checklist: ,, timeout performed, Correct Patient, Correct Site, Correct Laterality, Correct Procedure, Correct Position, site marked, Risks and benefits discussed,  Surgical consent,  Pre-op evaluation,  At surgeon's request and post-op pain management  Laterality: Right  Prep: chloraprep       Needles:  Injection technique: Single-shot  Needle Type: Echogenic Stimulator Needle     Needle Length: 10cm  Needle Gauge: 20     Additional Needles:   Procedures:,,,, ultrasound used (permanent image in chart),,,,  Narrative:  Start time: 05/11/2020 2:00 PM End time: 05/11/2020 2:15 PM Injection made incrementally with aspirations every 5 mL.  Performed by: Personally  Anesthesiologist: Murvin Natal, MD  Additional Notes: Functioning IV was confirmed and monitors were applied.  A timeout was performed. Sterile prep, hand hygiene and sterile gloves were used. A 128mm 20ga BBraun echogenic stimulator needle was used. Negative aspiration and negative test dose prior to incremental administration of local anesthetic. The patient tolerated the procedure well.  Ultrasound guidance: relevent anatomy identified, needle position confirmed, local anesthetic spread visualized around nerve(s), vascular puncture avoided.  Image printed for medical record.

## 2020-05-11 NOTE — Anesthesia Procedure Notes (Signed)
Procedure Name: Intubation Date/Time: 05/11/2020 2:26 PM Performed by: Reece Agar, CRNA Pre-anesthesia Checklist: Patient identified, Emergency Drugs available, Suction available and Patient being monitored Patient Re-evaluated:Patient Re-evaluated prior to induction Oxygen Delivery Method: Circle System Utilized Preoxygenation: Pre-oxygenation with 100% oxygen Induction Type: IV induction Ventilation: Mask ventilation without difficulty Laryngoscope Size: Mac and 3 Grade View: Grade I Tube type: Oral Tube size: 7.5 mm Number of attempts: 1 Airway Equipment and Method: Stylet and Oral airway Placement Confirmation: ETT inserted through vocal cords under direct vision,  positive ETCO2 and breath sounds checked- equal and bilateral Secured at: 23 cm Tube secured with: Tape Dental Injury: Teeth and Oropharynx as per pre-operative assessment

## 2020-05-11 NOTE — Op Note (Signed)
Right inguinal Hernia, Open, Procedure Note with ultrapro hernia system  mesh   Indications: The patient presented with a history of a right, reducible inguinal  hernia.  The risk of hernia repair include bleeding,  Infection,   Recurrence of the hernia,  Mesh use, chronic pain,  Organ injury,  Bowel injury,  Bladder injury,   nerve injury with numbness around the incision,  Death,  and worsening of preexisting  medical problems.  The alternatives to surgery have been discussed as well..  Long term expectations of both operative and non operative treatments have been discussed.   The patient agrees to proceed.   Pre-operative Diagnosis: right reducible inguinal hernia   Post-operative Diagnosis: same ( direct)   Surgeon: Turner Daniels  MD   Assistants: Dr Lenore Cordia MD  Anesthesia: General endotracheal anesthesia and TAP Block  ASA Class: 3  Procedure Details  The patient was seen again in the Holding Room. The risks, benefits, complications, treatment options, and expected outcomes were discussed with the patient. The possibilities of reaction to medication, pulmonary aspiration, perforation of viscus, bleeding, recurrent infection, the need for additional procedures, and development of a complication requiring transfusion or further operation were discussed with the patient and/or family. There was concurrence with the proposed plan, and informed consent was obtained. The site of surgery was properly noted/marked. The patient was taken to the Operating Room, identified as Craig Vazquez, and the procedure verified as hernia repair. A Time Out was held and the above information confirmed.  The patient was placed in the supine position and underwent induction of anesthesia, the lower abdomen and groin was prepped and draped in the standard fashion, and 0.5% Marcaine with epinephrine was used to anesthetize the skin over the mid-portion of the inguinal canal. A transverse incision was made.  Dissection was carried through the soft tissue to expose the inguinal canal and inguinal ligament along its lower edge. The external oblique fascia was split along the course of its fibers, exposing the inguinal canal. The cord and nerve were looped using a Penrose drain and reflected out of the field. The defect was exposed and a piece of prolene hernia system ultrapro mesh was and placed INTO  The direct  defect. Interupted 1-0 novafil suture was then used  to repair the defect, with the suture being sewn from the pubic tubercle inferiorly and superiorly along the canal to a level just beyond the internal ring. The mesh was split to allow passage of the cord and into the canal without entrapment. The ilioinguinal nerve was divided as it exited the muscle.  The contents were then returned to canal and the external oblique fashion was then closed in a continuous fashion using 3-0 Vicryl suture taking care not to cause entrapment. Scarpa's layer closed with 3 0 vicryl and 4 0 monocryl used to close the skin.  Dermabond used for dressing.  Instrument, sponge, and needle counts were correct prior to closure and at the conclusion of the case.  Findings: Hernia as above  Estimated Blood Loss: less than 50 mL         Drains: None         Total IV Fluids: per record         Specimens: none                Complications: None; patient tolerated the procedure well.         Disposition: PACU - hemodynamically stable.  Condition: stable

## 2020-05-11 NOTE — Transfer of Care (Signed)
Immediate Anesthesia Transfer of Care Note  Patient: Craig Vazquez  Procedure(s) Performed: REPAIR OF RIGHT INGUINAL HERNIA WITH MESH (Right Inguinal) INSERTION OF MESH (Right Inguinal)  Patient Location: PACU  Anesthesia Type:General  Level of Consciousness: drowsy  Airway & Oxygen Therapy: Patient Spontanous Breathing and Patient connected to face mask oxygen  Post-op Assessment: Report given to RN and Post -op Vital signs reviewed and stable  Post vital signs: Reviewed and stable  Last Vitals:  Vitals Value Taken Time  BP 161/66 05/11/20 1527  Temp    Pulse 72 05/11/20 1529  Resp 11 05/11/20 1529  SpO2 100 % 05/11/20 1529  Vitals shown include unvalidated device data.  Last Pain:  Vitals:   05/11/20 1335  TempSrc:   PainSc: 0-No pain         Complications: No complications documented.

## 2020-05-12 ENCOUNTER — Encounter (HOSPITAL_COMMUNITY): Payer: Self-pay | Admitting: Surgery

## 2020-05-12 DIAGNOSIS — K4090 Unilateral inguinal hernia, without obstruction or gangrene, not specified as recurrent: Secondary | ICD-10-CM | POA: Diagnosis not present

## 2020-05-12 DIAGNOSIS — Z7982 Long term (current) use of aspirin: Secondary | ICD-10-CM | POA: Diagnosis not present

## 2020-05-12 MED ORDER — HYDROCODONE-ACETAMINOPHEN 5-325 MG PO TABS: 1.0000 | ORAL_TABLET | ORAL | 0 refills | Status: DC | PRN

## 2020-05-12 MED ORDER — PHENOL 1.4 % MT LIQD
1.0000 | OROMUCOSAL | Status: DC | PRN
  Administered 2020-05-12: 1 via OROMUCOSAL
  Filled 2020-05-12: qty 177

## 2020-05-12 NOTE — Discharge Summary (Signed)
Physician Discharge Summary  Patient ID: Craig Vazquez MRN: [id number] DOB/AGE: Mar 11, 1931 86 y.o.  Admit date: 05/11/2020 Discharge date: 05/12/2020  Admission Diagnoses: Right inguinal hernia reducible  Discharge Diagnoses: Same Active Problems:   Right inguinal hernia   Discharged Condition: good  Hospital Course: Patient underwent repair of right inguinal hernia with mesh. He was admitted overnight due to advanced age. He did well overnight. He was tolerating his diet on postop day one. He was able to void. He had good pain control and is able to ambulate with assistance. He was discharged home on postoperative day one in good condition.      Treatments: surgery: Right inguinal hernia. Mesh  Discharge Exam: Blood pressure (!) 143/61, pulse 65, temperature (!) 97.5 F (36.4 C), resp. rate 16, height 5\' 10"  (1.778 m), weight 66.8 kg, SpO2 98 %. General appearance: alert and cooperative Resp: clear to auscultation bilaterally Cardio: regular rate and rhythm, S1, S2 normal, no murmur, click, rub or gallop Incision/Wound: Right groin incision clean dry intact without significant hematoma or swelling. No recurrence.  Disposition: Discharge disposition: 01-Home or Self Care       Discharge Instructions    Diet - low sodium heart healthy   Complete by: As directed    Increase activity slowly   Complete by: As directed      Allergies as of 05/12/2020   No Known Allergies     Medication List    TAKE these medications   albuterol 108 (90 Base) MCG/ACT inhaler Commonly known as: VENTOLIN HFA Inhale 1 puff into the lungs every 4 (four) hours as needed for wheezing or shortness of breath.   aspirin 325 MG tablet Take 1 tablet (325 mg total) by mouth daily.   clindamycin 1 % external solution Commonly known as: CLEOCIN T Apply 1 application topically 2 (two) times daily as needed (itching/dry skin).   docusate sodium 100 MG capsule Commonly known as: COLACE Take  100 mg by mouth daily.   Flovent Diskus 100 MCG/BLIST Aepb Generic drug: Fluticasone Propionate (Inhal) Inhale 1 Inhaler into the lungs 2 (two) times daily.   fluticasone 50 MCG/ACT nasal spray Commonly known as: FLONASE Place 1 spray into the nose daily as needed for allergies.   HYDROcodone-acetaminophen 5-325 MG tablet Commonly known as: NORCO/VICODIN Take 1-2 tablets by mouth every 4 (four) hours as needed for moderate pain.   pantoprazole 40 MG tablet Commonly known as: PROTONIX Take 40 mg by mouth daily before breakfast.   polyethylene glycol 17 g packet Commonly known as: MIRALAX / GLYCOLAX Take 17 g by mouth daily as needed for moderate constipation.   Systane Complete 0.6 % Soln Generic drug: Propylene Glycol Place 1 drop into both eyes daily as needed (dry eyes).   triamcinolone cream 0.1 % Commonly known as: KENALOG Apply 1 application topically daily as needed.        Signed: Turner Daniels MD  05/12/2020, 8:27 AM

## 2020-05-12 NOTE — Progress Notes (Signed)
Craig Vazquez to be D/C'd per MD order. Discussed with the patient and all questions fully answered.  IV catheter discontinued intact. Site without signs and symptoms of complications. Dressing and pressure applied.  An After Visit Summary was printed and given to the patient. Patient informed where to pick up prescriptions.  D/c education completed with patient including follow up instructions, medication list, d/c activities limitations if indicated, with other d/c instructions as indicated by MD - patient able to verbalize understanding, all questions fully answered.  Patient instructed to return to ED, call 911, or call MD for any changes in condition.  Patient to be escorted via [place], and D/C home via private auto.

## 2020-05-15 ENCOUNTER — Emergency Department (HOSPITAL_COMMUNITY): Payer: Medicare Other

## 2020-05-15 ENCOUNTER — Emergency Department (HOSPITAL_COMMUNITY)
Admission: EM | Admit: 2020-05-15 | Discharge: 2020-05-16 | Disposition: A | Discharge status: Home / Self Care | Payer: Medicare Other | Attending: Emergency Medicine | Admitting: Emergency Medicine

## 2020-05-15 DIAGNOSIS — R197 Diarrhea, unspecified: Secondary | ICD-10-CM | POA: Diagnosis not present

## 2020-05-15 DIAGNOSIS — J45909 Unspecified asthma, uncomplicated: Secondary | ICD-10-CM | POA: Insufficient documentation

## 2020-05-15 DIAGNOSIS — R531 Weakness: Secondary | ICD-10-CM | POA: Diagnosis not present

## 2020-05-15 DIAGNOSIS — Z20822 Contact with and (suspected) exposure to covid-19: Secondary | ICD-10-CM | POA: Diagnosis not present

## 2020-05-15 DIAGNOSIS — Z87891 Personal history of nicotine dependence: Secondary | ICD-10-CM | POA: Diagnosis not present

## 2020-05-15 DIAGNOSIS — Z7982 Long term (current) use of aspirin: Secondary | ICD-10-CM | POA: Insufficient documentation

## 2020-05-15 DIAGNOSIS — E119 Type 2 diabetes mellitus without complications: Secondary | ICD-10-CM | POA: Diagnosis not present

## 2020-05-15 LAB — URINALYSIS, ROUTINE W REFLEX MICROSCOPIC
Bilirubin Urine: NEGATIVE
Glucose, UA: NEGATIVE mg/dL
Hgb urine dipstick: NEGATIVE
Ketones, ur: NEGATIVE mg/dL
Leukocytes,Ua: NEGATIVE
Nitrite: NEGATIVE
Protein, ur: 30 mg/dL — AB
Specific Gravity, Urine: 1.021 (ref 1.005–1.030)
pH: 5 (ref 5.0–8.0)

## 2020-05-15 LAB — COMPREHENSIVE METABOLIC PANEL
ALT: 14 U/L (ref 0–44)
AST: 20 U/L (ref 15–41)
Albumin: 3.5 g/dL (ref 3.5–5.0)
Alkaline Phosphatase: 56 U/L (ref 38–126)
Anion gap: 11 (ref 5–15)
BUN: 21 mg/dL (ref 8–23)
CO2: 20 mmol/L — ABNORMAL LOW (ref 22–32)
Calcium: 8.4 mg/dL — ABNORMAL LOW (ref 8.9–10.3)
Chloride: 101 mmol/L (ref 98–111)
Creatinine, Ser: 1.59 mg/dL — ABNORMAL HIGH (ref 0.61–1.24)
GFR, Estimated: 42 mL/min — ABNORMAL LOW (ref >60)
Glucose, Bld: 138 mg/dL — ABNORMAL HIGH (ref 70–99)
Potassium: 3.3 mmol/L — ABNORMAL LOW (ref 3.5–5.1)
Sodium: 132 mmol/L — ABNORMAL LOW (ref 135–145)
Total Bilirubin: 0.4 mg/dL (ref 0.3–1.2)
Total Protein: 6.6 g/dL (ref 6.5–8.1)

## 2020-05-15 LAB — CBC WITH DIFFERENTIAL/PLATELET
Abs Immature Granulocytes: 0.03 10*3/uL (ref 0.00–0.07)
Basophils Absolute: 0 10*3/uL (ref 0.0–0.1)
Basophils Relative: 0 %
Eosinophils Absolute: 0 10*3/uL (ref 0.0–0.5)
Eosinophils Relative: 0 %
HCT: 30.3 % — ABNORMAL LOW (ref 39.0–52.0)
Hemoglobin: 9.5 g/dL — ABNORMAL LOW (ref 13.0–17.0)
Immature Granulocytes: 0 %
Lymphocytes Relative: 3 %
Lymphs Abs: 0.3 10*3/uL — ABNORMAL LOW (ref 0.7–4.0)
MCH: 25.5 pg — ABNORMAL LOW (ref 26.0–34.0)
MCHC: 31.4 g/dL (ref 30.0–36.0)
MCV: 81.2 fL (ref 80.0–100.0)
Monocytes Absolute: 0.4 10*3/uL (ref 0.1–1.0)
Monocytes Relative: 3 %
Neutro Abs: 10.5 10*3/uL — ABNORMAL HIGH (ref 1.7–7.7)
Neutrophils Relative %: 94 %
Platelets: 294 10*3/uL (ref 150–400)
RBC: 3.73 MIL/uL — ABNORMAL LOW (ref 4.22–5.81)
RDW: 16.4 % — ABNORMAL HIGH (ref 11.5–15.5)
WBC: 11.2 10*3/uL — ABNORMAL HIGH (ref 4.0–10.5)
nRBC: 0 % (ref 0.0–0.2)

## 2020-05-15 LAB — LACTIC ACID, PLASMA: Lactic Acid, Venous: 1.8 mmol/L (ref 0.5–1.9)

## 2020-05-15 NOTE — ED Notes (Signed)
Called daughter Marlowe Kays with update.  Daughter is outside waiting.  [phone].

## 2020-05-15 NOTE — ED Triage Notes (Signed)
S/p right inguinal hernia with mesh 05-11-20.   Onset yesterday diarrhea x 5, weakness.  Temperature noted to be 100.2 in triage.

## 2020-05-15 NOTE — ED Triage Notes (Signed)
Pt arrived with [facility] for diarrhea and dehydration; recent hernia repair surgery. EMS VS 142/71, pulse 110, resp 60, spO2 98%

## 2020-05-16 DIAGNOSIS — R197 Diarrhea, unspecified: Secondary | ICD-10-CM | POA: Diagnosis not present

## 2020-05-16 LAB — RESPIRATORY PANEL BY RT PCR (FLU A&B, COVID)
Influenza A by PCR: NEGATIVE
Influenza B by PCR: NEGATIVE
SARS Coronavirus 2 by RT PCR: NEGATIVE

## 2020-05-16 MED ORDER — ONDANSETRON HCL 4 MG/2ML IJ SOLN: 4.0000 mg | Freq: Once | INTRAMUSCULAR | Status: DC

## 2020-05-16 MED ORDER — SODIUM CHLORIDE 0.9 % IV BOLUS
1000.0000 mL | Freq: Once | INTRAVENOUS | Status: AC
  Administered 2020-05-16: 1000 mL via INTRAVENOUS

## 2020-05-16 NOTE — ED Notes (Signed)
Assuming care of patient at this time. Pt is resting with eyes closed and in NAD. Bed in low position. Call bell within reach.

## 2020-05-16 NOTE — Discharge Instructions (Signed)
Please schedule an appointment with your primary doctor for recheck on Monday.  If you have worsening diarrhea, any abdominal pain, fever or other new concerning symptom, return to ER for reassessment.  Stay hydrated as discussed.

## 2020-05-16 NOTE — ED Provider Notes (Signed)
[facility] EMERGENCY DEPARTMENT Provider Note   CSN: [id number] Arrival date & time: 05/15/20  1929     History No chief complaint on file.   Craig Vazquez is a 86 y.o. male. Presents to ER with concern for diarrhea. Diarrhea has been going on for the past 24 hours or so. Multiple loose stools, almost 10. No blood. Feels somewhat fatigued, malaise. No abdominal pain, no nausea or vomiting. No fevers.  Recent surgery for inguinal hernia repair.  HPI     Past Medical History:  Diagnosis Date  . Arthritis 2017   R Knee  . Asthma   . Diabetes mellitus without complication ([place])    Borderline diabetic  . History of hiatal hernia 2019  . Hypoglycemia   . Recurrent irreducible left inguinal hernia 04/05/2018  . Stroke ([place])    5.1.14  . Vertigo     Patient Active Problem List   Diagnosis Date Noted  . Right inguinal hernia 05/11/2020  . Primary osteoarthritis of left knee 09/23/2019  . Recurrent incarcerated left inguinal hernia s/p open repair 04/06/2018 04/05/2018  . Stroke of unknown etiology ([place]) 12/12/2012  . Asthma, chronic 11/08/2012  . BPPV (benign paroxysmal positional vertigo) 11/08/2012  . Expressive aphasia 11/07/2012  . 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    Past Surgical History:  Procedure Laterality Date  . DENTAL SURGERY  2008   implants upper and lower  . ESOPHAGOGASTRODUODENOSCOPY (EGD) WITH PROPOFOL N/A 08/13/2018   Procedure: ESOPHAGOGASTRODUODENOSCOPY (EGD) WITH PROPOFOL;  Surgeon: Otis Brace, MD;  Location: [facility];  Service: Gastroenterology;  Laterality: N/A;  . EYE SURGERY Bilateral    Cataract  . HAND SURGERY     rt hand  . herina    . HERNIA REPAIR    . INGUINAL HERNIA REPAIR  04/05/2018   OPEN   . INGUINAL HERNIA REPAIR Left 04/05/2018   Procedure: OPEN REPAIR OF LEFT INGUINAL HERNIA;  Surgeon: Fanny Skates, MD;  Location: [facility];  Service: General;  Laterality: Left;  . INGUINAL HERNIA REPAIR Right 05/11/2020    Procedure: REPAIR OF RIGHT INGUINAL HERNIA WITH MESH;  Surgeon: Erroll Luna, MD;  Location: [facility];  Service: General;  Laterality: Right;  . INSERTION OF MESH Left 04/05/2018   Procedure: INSERTION OF MESH;  Surgeon: Fanny Skates, MD;  Location: [facility];  Service: General;  Laterality: Left;  . INSERTION OF MESH Right 05/11/2020   Procedure: INSERTION OF MESH;  Surgeon: Erroll Luna, MD;  Location: [place];  Service: General;  Laterality: Right;  . LAPAROSCOPIC GASTROTOMY W/ REPAIR OF ULCER  1970  . MOHS SURGERY  2017  . TEE WITHOUT CARDIOVERSION N/A 01/16/2013   Procedure: TRANSESOPHAGEAL ECHOCARDIOGRAM (TEE);  Surgeon: Thayer Headings, MD;  Location: [facility] ENDOSCOPY;  Service: Cardiovascular;  Laterality: N/A;       Family History  Problem Relation Age of Onset  . Cancer Brother        lung ca x 2 brothers    Social History   Tobacco Use  . Smoking status: Former Smoker    Quit date: 1970    Years since quitting: 51.8  . Smokeless tobacco: Never Used  . Tobacco comment: quit 1970  Vaping Use  . Vaping Use: Never used  Substance Use Topics  . Alcohol use: Yes    Comment: BEER/WINE OCC  . Drug use: No    Home Medications Prior to Admission medications   Medication Sig Start Date End Date Taking? Authorizing Provider  albuterol (PROVENTIL  HFA;VENTOLIN HFA) 108 (90 Base) MCG/ACT inhaler Inhale 1 puff into the lungs every 4 (four) hours as needed for wheezing or shortness of breath.   Yes [provider]  aspirin 325 MG tablet Take 1 tablet (325 mg total) by mouth daily. 11/09/12  Yes Stoneking, Hal, MD  clindamycin (CLEOCIN T) 1 % external solution Apply 1 application topically 2 (two) times daily as needed (itching/dry skin).   Yes [provider]  docusate sodium (COLACE) 100 MG capsule Take 100 mg by mouth daily.   Yes [provider]  fluticasone (FLONASE) 50 MCG/ACT nasal spray Place 1 spray into the nose daily as needed for allergies.    Yes  [provider]  Fluticasone Propionate, Inhal, (FLOVENT DISKUS) 100 MCG/BLIST AEPB Inhale 1 Inhaler into the lungs 2 (two) times daily as needed (for respiratory issues.).    Yes [provider]  HYDROcodone-acetaminophen (NORCO/VICODIN) 5-325 MG tablet Take 1-2 tablets by mouth every 4 (four) hours as needed for moderate pain. 05/12/20  Yes Cornett, Marcello Moores, MD  pantoprazole (PROTONIX) 40 MG tablet Take 40 mg by mouth daily before breakfast.   Yes [provider]  polyethylene glycol (MIRALAX / GLYCOLAX) 17 g packet Take 17 g by mouth daily as needed for moderate constipation.   Yes [provider]  Propylene Glycol (SYSTANE COMPLETE) 0.6 % SOLN Place 1 drop into both eyes daily as needed (dry eyes).    Yes [provider]  triamcinolone cream (KENALOG) 0.1 % Apply 1 application topically daily as needed (dry skin).    Yes [provider]    Allergies    Patient has no known allergies.  Review of Systems   Review of Systems  Constitutional: Negative for chills and fever.  HENT: Negative for ear pain and sore throat.   Eyes: Negative for pain and visual disturbance.  Respiratory: Negative for cough and shortness of breath.   Cardiovascular: Negative for chest pain and palpitations.  Gastrointestinal: Positive for diarrhea. Negative for abdominal pain and vomiting.  Genitourinary: Negative for dysuria and hematuria.  Musculoskeletal: Negative for arthralgias and back pain.  Skin: Negative for color change and rash.  Neurological: Negative for seizures and syncope.  All other systems reviewed and are negative.   Physical Exam Updated Vital Signs BP 130/68 (BP Location: Right Arm)   Pulse 90   Temp 98.1 F (36.7 C) (Oral)   Resp 17   Ht 5\' 10"  (1.778 m)   Wt 65.8 kg   SpO2 98%   BMI 20.81 kg/m   Physical Exam Vitals and nursing note reviewed.  Constitutional:      Appearance: He is well-developed.  HENT:     Head:  Normocephalic and atraumatic.  Eyes:     Conjunctiva/sclera: Conjunctivae normal.  Cardiovascular:     Rate and Rhythm: Normal rate and regular rhythm.     Heart sounds: No murmur heard.   Pulmonary:     Effort: Pulmonary effort is normal. No respiratory distress.     Breath sounds: Normal breath sounds.  Abdominal:     Palpations: Abdomen is soft.     Tenderness: There is no abdominal tenderness.  Musculoskeletal:        General: No deformity or signs of injury.     Cervical back: Neck supple.  Skin:    General: Skin is warm and dry.  Neurological:     General: No focal deficit present.     Mental Status: He is alert and oriented  to person, place, and time.  Psychiatric:        Mood and Affect: Mood normal.        Behavior: Behavior normal.     ED Results / Procedures / Treatments   Labs (all labs ordered are listed, but only abnormal results are displayed) Labs Reviewed  COMPREHENSIVE METABOLIC PANEL - Abnormal; Notable for the following components:      Result Value   Sodium 132 (*)    Potassium 3.3 (*)    CO2 20 (*)    Glucose, Bld 138 (*)    Creatinine, Ser 1.59 (*)    Calcium 8.4 (*)    GFR, Estimated 42 (*)    All other components within normal limits  CBC WITH DIFFERENTIAL/PLATELET - Abnormal; Notable for the following components:   WBC 11.2 (*)    RBC 3.73 (*)    Hemoglobin 9.5 (*)    HCT 30.3 (*)    MCH 25.5 (*)    RDW 16.4 (*)    Neutro Abs 10.5 (*)    Lymphs Abs 0.3 (*)    All other components within normal limits  URINALYSIS, ROUTINE W REFLEX MICROSCOPIC - Abnormal; Notable for the following components:   Color, Urine AMBER (*)    APPearance HAZY (*)    Protein, ur 30 (*)    Bacteria, UA RARE (*)    All other components within normal limits  RESPIRATORY PANEL BY RT PCR (FLU A&B, COVID)  GASTROINTESTINAL PANEL BY PCR, STOOL (REPLACES STOOL CULTURE)  C DIFFICILE QUICK SCREEN W PCR REFLEX  LACTIC ACID, PLASMA    EKG EKG  Interpretation  Date/Time:  Sunday May 16 2020 02:12:38 EST Ventricular Rate:  96 PR Interval:    QRS Duration: 143 QT Interval:  369 QTC Calculation: 467 R Axis:   -29 Text Interpretation: Sinus rhythm Atrial premature complex Prolonged PR interval Right bundle branch block Confirmed by ,  ([id number]) on 05/16/2020 2:30:55 AM   Radiology DG Chest 2 View  Result Date: 05/15/2020 CLINICAL DATA:  Weakness EXAM: CHEST - 2 VIEW COMPARISON:  None. FINDINGS: The heart size and mediastinal contours are within normal limits. Aortic knob calcifications. Both lungs are clear. The visualized skeletal structures are unremarkable. IMPRESSION: No active cardiopulmonary disease. Electronically Signed   By: Bindu  Avutu M.D.   On: 05/15/2020 22:03    Procedures Procedures (including critical care time)  Medications Ordered in ED Medications  ondansetron (ZOFRAN) injection 4 mg (4 mg Intravenous Not Given 05/16/20 0257)  sodium chloride 0.9 % bolus 1,000 mL (1,000 mLs Intravenous Other (enter comment in med admin window) 05/16/20 0148)    ED Course  I have reviewed the triage vital signs and the nursing notes.  Pertinent labs & imaging results that were available during my care of the patient were reviewed by me and considered in my medical decision making (see chart for details).    MDM Rules/Calculators/A&P                         86  year old male presents to ER with concern for diarrhea. Recent surgery for inguinal hernia repair. On exam, patient noted to be remarkably well-appearing in no distress, soft abdomen on careful abdominal exam. His lab work was notable for mild elevation in creatinine although this is similar to check 10 days ago. Suspect he may have degree of mild dehydration. Noted 100.3 degrees temperature when patient checked in. Repeat without any antipyretic was normal. Covid  test was negative. Given his soft abdomen, low suspicion for acute abdominal process. No  nausea or vomiting to suggest ileus, obstruction. Had ordered GI panel, C. difficile testing however patient was unable to provide stool sample. Given patient's well appearance, no ongoing symptoms, believe he can be discharged and managed in the outpatient setting. He has follow-up scheduled with his surgeon for recheck next week already. Additionally recommended patient have follow-up with his primary doctor for repeat labs and to discuss stool testing. Reviewed return precautions in detail with patient and daughter at bedside, discharged home.   After the discussed management above, the patient was determined to be safe for discharge.  The patient was in agreement with this plan and all questions regarding their care were answered.  ED return precautions were discussed and the patient will return to the ED with any significant worsening of condition.  Final Clinical Impression(s) / ED Diagnoses Final diagnoses:  Diarrhea, unspecified type    Rx / DC Orders ED Discharge Orders    None       Lucrezia Starch, MD 05/16/20 [phone]

## 2020-05-19 DIAGNOSIS — K529 Noninfective gastroenteritis and colitis, unspecified: Secondary | ICD-10-CM | POA: Diagnosis not present

## 2020-05-19 DIAGNOSIS — R197 Diarrhea, unspecified: Secondary | ICD-10-CM | POA: Diagnosis not present

## 2020-05-25 ENCOUNTER — Encounter (HOSPITAL_COMMUNITY): Payer: Self-pay | Admitting: Surgery

## 2020-05-25 NOTE — Addendum Note (Signed)
Addendum  created 05/25/20 1811 by Lillia Abed, MD   Intraprocedure Event edited, Intraprocedure Staff edited

## 2020-06-24 DIAGNOSIS — H40013 Open angle with borderline findings, low risk, bilateral: Secondary | ICD-10-CM | POA: Diagnosis not present

## 2020-08-12 DIAGNOSIS — Z0000 Encounter for general adult medical examination without abnormal findings: Secondary | ICD-10-CM | POA: Diagnosis not present

## 2020-08-12 DIAGNOSIS — J45909 Unspecified asthma, uncomplicated: Secondary | ICD-10-CM | POA: Diagnosis not present

## 2020-08-12 DIAGNOSIS — Z1389 Encounter for screening for other disorder: Secondary | ICD-10-CM | POA: Diagnosis not present

## 2020-08-12 DIAGNOSIS — J309 Allergic rhinitis, unspecified: Secondary | ICD-10-CM | POA: Diagnosis not present

## 2020-08-12 DIAGNOSIS — E7800 Pure hypercholesterolemia, unspecified: Secondary | ICD-10-CM | POA: Diagnosis not present

## 2020-08-12 DIAGNOSIS — R7309 Other abnormal glucose: Secondary | ICD-10-CM | POA: Diagnosis not present

## 2020-08-12 DIAGNOSIS — H409 Unspecified glaucoma: Secondary | ICD-10-CM | POA: Diagnosis not present

## 2020-08-12 DIAGNOSIS — N182 Chronic kidney disease, stage 2 (mild): Secondary | ICD-10-CM | POA: Diagnosis not present

## 2020-08-12 DIAGNOSIS — I639 Cerebral infarction, unspecified: Secondary | ICD-10-CM | POA: Diagnosis not present

## 2020-08-12 DIAGNOSIS — K911 Postgastric surgery syndromes: Secondary | ICD-10-CM | POA: Diagnosis not present

## 2020-08-12 DIAGNOSIS — I69359 Hemiplegia and hemiparesis following cerebral infarction affecting unspecified side: Secondary | ICD-10-CM | POA: Diagnosis not present

## 2020-08-12 DIAGNOSIS — K279 Peptic ulcer, site unspecified, unspecified as acute or chronic, without hemorrhage or perforation: Secondary | ICD-10-CM | POA: Diagnosis not present

## 2020-09-09 DIAGNOSIS — Z961 Presence of intraocular lens: Secondary | ICD-10-CM | POA: Diagnosis not present

## 2020-10-18 ENCOUNTER — Telehealth: Payer: Self-pay | Admitting: Orthopaedic Surgery

## 2020-10-18 NOTE — Telephone Encounter (Signed)
Pt called asking to have an authorization sent for a gel injection in his L knee; pt would like a update when we send the autho to his insurance.   [phone]

## 2020-10-19 NOTE — Telephone Encounter (Signed)
Noted  

## 2020-10-27 ENCOUNTER — Telehealth: Payer: Self-pay | Admitting: Orthopaedic Surgery

## 2020-10-27 NOTE — Telephone Encounter (Signed)
Pt called checking on the status of his gel injection; he states if he's still waiting to get approved he would like an estimate of how much longer he'll have to wait.   [phone]

## 2020-10-28 ENCOUNTER — Telehealth: Payer: Self-pay

## 2020-10-28 NOTE — Telephone Encounter (Signed)
Talked with patient concerning gel injection.  

## 2020-10-28 NOTE — Telephone Encounter (Signed)
VOB has been submitted for Monovisc, left knee. Pending BV. 

## 2020-10-29 ENCOUNTER — Telehealth: Payer: Self-pay

## 2020-10-29 NOTE — Telephone Encounter (Signed)
Approved for Monovisc, left knee. Medicare deductible has been met Secondary insurance [employer] will pick up remaining eligible expenses at 100% No Co-pay  No PA required  Patient will call back to schedule appointment with Dr. Xu. 

## 2020-11-02 ENCOUNTER — Ambulatory Visit (INDEPENDENT_AMBULATORY_CARE_PROVIDER_SITE_OTHER): Payer: Medicare Other | Admitting: Orthopaedic Surgery

## 2020-11-02 ENCOUNTER — Other Ambulatory Visit: Payer: Self-pay

## 2020-11-02 DIAGNOSIS — M1712 Unilateral primary osteoarthritis, left knee: Secondary | ICD-10-CM | POA: Diagnosis not present

## 2020-11-02 MED ORDER — BUPIVACAINE HCL 0.25 % IJ SOLN
2.0000 mL | INTRAMUSCULAR | Status: AC | PRN
  Administered 2020-11-02: 2 mL via INTRA_ARTICULAR

## 2020-11-02 MED ORDER — HYALURONAN 88 MG/4ML IX SOSY
88.0000 mg | PREFILLED_SYRINGE | INTRA_ARTICULAR | Status: AC | PRN
  Administered 2020-11-02: 88 mg via INTRA_ARTICULAR

## 2020-11-02 MED ORDER — LIDOCAINE HCL 1 % IJ SOLN
2.0000 mL | INTRAMUSCULAR | Status: AC | PRN
  Administered 2020-11-02: 2 mL

## 2020-11-02 NOTE — Progress Notes (Signed)
Office Visit Note   Patient: Craig Vazquez           Date of Birth: 1930/12/05           MRN: [id number] Visit Date: 11/02/2020              Requested by: Wenda Low, MD [address] [place],  [place] [address] PCP: Wenda Low, MD   Assessment & Plan: Visit Diagnoses:  1. Primary osteoarthritis of left knee     Plan: Impression is left knee degenerative joint disease.  Today, we proceeded with Monovisc injection to the left knee.  He will follow-up with [place] as needed.  Follow-Up Instructions: Return if symptoms worsen or fail to improve.   Orders:  Orders Placed This Encounter  Procedures  . Large Joint Inj   No orders of the defined types were placed in this encounter.     Procedures: Large Joint Inj: L knee on 11/02/2020 1:44 PM Indications: pain Details: 22 G needle, anterolateral approach Medications: 2 mL lidocaine 1 %; 2 mL bupivacaine 0.25 %; 88 mg Hyaluronan 88 MG/4ML      Clinical Data: No additional findings.   Subjective: Chief Complaint  Patient presents with  . Other    Knee pain-gel injection    HPI patient is very pleasant 87 year old gentleman who comes in today for second Monovisc injection to the left knee.  He has had a viscosupplementation injection in the past with good relief of symptoms.     Objective: Vital Signs: There were no vitals taken for this visit.    Ortho Exam unchanged exam  Specialty Comments:  No specialty comments available.  Imaging: No results found.   PMFS History: Patient Active Problem List   Diagnosis Date Noted  . Right inguinal hernia 05/11/2020  . Primary osteoarthritis of left knee 09/23/2019  . Recurrent incarcerated left inguinal hernia s/p open repair 04/06/2018 04/05/2018  . Stroke of unknown etiology ([place]) 12/12/2012  . Asthma, chronic 11/08/2012  . BPPV (benign paroxysmal positional vertigo) 11/08/2012  . Expressive aphasia 11/07/2012  . 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 Arthritis 2017   R Knee  . Asthma   . Diabetes mellitus without complication ([place])    Borderline diabetic  . History of hiatal hernia 2019  . Hypoglycemia   . Recurrent irreducible left inguinal hernia 04/05/2018  . Stroke ([place])    5.1.14  . Vertigo     Family History  Problem Relation Age of Onset  . Cancer Brother        lung ca x 2 brothers    Past Surgical History:  Procedure Laterality Date  . DENTAL SURGERY  2008   implants upper and lower  . ESOPHAGOGASTRODUODENOSCOPY (EGD) WITH PROPOFOL N/A 08/13/2018   Procedure: ESOPHAGOGASTRODUODENOSCOPY (EGD) WITH PROPOFOL;  Surgeon: Otis Brace, MD;  Location: [facility];  Service: Gastroenterology;  Laterality: N/A;  . EYE SURGERY Bilateral    Cataract  . HAND SURGERY     rt hand  . herina    . HERNIA REPAIR    . INGUINAL HERNIA REPAIR  04/05/2018   OPEN   . INGUINAL HERNIA REPAIR Left 04/05/2018   Procedure: OPEN REPAIR OF LEFT INGUINAL HERNIA;  Surgeon: Fanny Skates, MD;  Location: [facility];  Service: General;  Laterality: Left;  . INGUINAL HERNIA REPAIR Right 05/11/2020   Procedure: REPAIR OF RIGHT INGUINAL HERNIA WITH MESH;  Surgeon: Erroll Luna, MD;  Location: [facility];  Service: General;  Laterality: Right;  . INSERTION OF MESH Left 04/05/2018   Procedure: INSERTION OF MESH;  Surgeon: Fanny Skates, MD;  Location: [place];  Service: General;  Laterality: Left;  . INSERTION OF MESH Right 05/11/2020   Procedure: INSERTION OF MESH;  Surgeon: Erroll Luna, MD;  Location: [place];  Service: General;  Laterality: Right;  . LAPAROSCOPIC GASTROTOMY W/ REPAIR OF ULCER  1970  . MOHS SURGERY  2017  . TEE WITHOUT CARDIOVERSION N/A 01/16/2013   Procedure: TRANSESOPHAGEAL ECHOCARDIOGRAM (TEE);  Surgeon: Thayer Headings, MD;  Location: [facility];  Service: Cardiovascular;  Laterality: N/A;   Social History   Occupational History  . Occupation: retired  Tobacco Use  . Smoking  status: Former Smoker    Quit date: 1970    Years since quitting: 52.3  . Smokeless tobacco: Never Used  . Tobacco comment: quit 1970  Vaping Use  . Vaping Use: Never used  Substance and Sexual Activity  . Alcohol use: Yes    Comment: BEER/WINE OCC  . Drug use: No  . Sexual activity: Not on file

## 2020-11-24 DIAGNOSIS — H40013 Open angle with borderline findings, low risk, bilateral: Secondary | ICD-10-CM | POA: Diagnosis not present

## 2020-12-16 DIAGNOSIS — M5442 Lumbago with sciatica, left side: Secondary | ICD-10-CM | POA: Diagnosis not present

## 2020-12-16 DIAGNOSIS — M5441 Lumbago with sciatica, right side: Secondary | ICD-10-CM | POA: Diagnosis not present

## 2020-12-16 DIAGNOSIS — M545 Low back pain, unspecified: Secondary | ICD-10-CM | POA: Diagnosis not present

## 2021-01-31 DIAGNOSIS — N1831 Chronic kidney disease, stage 3a: Secondary | ICD-10-CM | POA: Diagnosis not present

## 2021-01-31 DIAGNOSIS — R479 Unspecified speech disturbances: Secondary | ICD-10-CM | POA: Diagnosis not present

## 2021-01-31 DIAGNOSIS — K279 Peptic ulcer, site unspecified, unspecified as acute or chronic, without hemorrhage or perforation: Secondary | ICD-10-CM | POA: Diagnosis not present

## 2021-01-31 DIAGNOSIS — R7303 Prediabetes: Secondary | ICD-10-CM | POA: Diagnosis not present

## 2021-01-31 DIAGNOSIS — R4701 Aphasia: Secondary | ICD-10-CM | POA: Diagnosis not present

## 2021-01-31 DIAGNOSIS — J45909 Unspecified asthma, uncomplicated: Secondary | ICD-10-CM | POA: Diagnosis not present

## 2021-01-31 DIAGNOSIS — I69359 Hemiplegia and hemiparesis following cerebral infarction affecting unspecified side: Secondary | ICD-10-CM | POA: Diagnosis not present

## 2021-03-29 DIAGNOSIS — H7202 Central perforation of tympanic membrane, left ear: Secondary | ICD-10-CM | POA: Diagnosis not present

## 2021-03-29 DIAGNOSIS — H90A32 Mixed conductive and sensorineural hearing loss, unilateral, left ear with restricted hearing on the contralateral side: Secondary | ICD-10-CM | POA: Diagnosis not present

## 2021-03-29 DIAGNOSIS — H903 Sensorineural hearing loss, bilateral: Secondary | ICD-10-CM | POA: Diagnosis not present

## 2021-03-29 DIAGNOSIS — H6122 Impacted cerumen, left ear: Secondary | ICD-10-CM | POA: Diagnosis not present

## 2021-03-31 DIAGNOSIS — Z85828 Personal history of other malignant neoplasm of skin: Secondary | ICD-10-CM | POA: Diagnosis not present

## 2021-03-31 DIAGNOSIS — L570 Actinic keratosis: Secondary | ICD-10-CM | POA: Diagnosis not present

## 2021-03-31 DIAGNOSIS — L814 Other melanin hyperpigmentation: Secondary | ICD-10-CM | POA: Diagnosis not present

## 2021-03-31 DIAGNOSIS — L298 Other pruritus: Secondary | ICD-10-CM | POA: Diagnosis not present

## 2021-03-31 DIAGNOSIS — D225 Melanocytic nevi of trunk: Secondary | ICD-10-CM | POA: Diagnosis not present

## 2021-03-31 DIAGNOSIS — L821 Other seborrheic keratosis: Secondary | ICD-10-CM | POA: Diagnosis not present

## 2021-03-31 DIAGNOSIS — Z08 Encounter for follow-up examination after completed treatment for malignant neoplasm: Secondary | ICD-10-CM | POA: Diagnosis not present

## 2021-04-04 ENCOUNTER — Emergency Department: Payer: Medicare Other

## 2021-04-04 ENCOUNTER — Other Ambulatory Visit: Payer: Self-pay

## 2021-04-04 ENCOUNTER — Inpatient Hospital Stay
Admission: EM | Admit: 2021-04-04 | Discharge: 2021-04-08 | DRG: 871 | Disposition: A | Discharge status: Home Health | Payer: Medicare Other | Attending: Hospitalist | Admitting: Hospitalist

## 2021-04-04 DIAGNOSIS — J4541 Moderate persistent asthma with (acute) exacerbation: Secondary | ICD-10-CM

## 2021-04-04 DIAGNOSIS — N1831 Chronic kidney disease, stage 3a: Secondary | ICD-10-CM | POA: Diagnosis present

## 2021-04-04 DIAGNOSIS — J189 Pneumonia, unspecified organism: Secondary | ICD-10-CM | POA: Diagnosis present

## 2021-04-04 DIAGNOSIS — Z87891 Personal history of nicotine dependence: Secondary | ICD-10-CM

## 2021-04-04 DIAGNOSIS — I5031 Acute diastolic (congestive) heart failure: Secondary | ICD-10-CM | POA: Diagnosis not present

## 2021-04-04 DIAGNOSIS — Z8673 Personal history of transient ischemic attack (TIA), and cerebral infarction without residual deficits: Secondary | ICD-10-CM

## 2021-04-04 DIAGNOSIS — E860 Dehydration: Secondary | ICD-10-CM | POA: Diagnosis present

## 2021-04-04 DIAGNOSIS — Z801 Family history of malignant neoplasm of trachea, bronchus and lung: Secondary | ICD-10-CM | POA: Diagnosis not present

## 2021-04-04 DIAGNOSIS — K219 Gastro-esophageal reflux disease without esophagitis: Secondary | ICD-10-CM | POA: Diagnosis present

## 2021-04-04 DIAGNOSIS — I639 Cerebral infarction, unspecified: Secondary | ICD-10-CM | POA: Diagnosis not present

## 2021-04-04 DIAGNOSIS — Z7982 Long term (current) use of aspirin: Secondary | ICD-10-CM

## 2021-04-04 DIAGNOSIS — N183 Chronic kidney disease, stage 3 unspecified: Secondary | ICD-10-CM | POA: Diagnosis present

## 2021-04-04 DIAGNOSIS — Z20822 Contact with and (suspected) exposure to covid-19: Secondary | ICD-10-CM | POA: Diagnosis present

## 2021-04-04 DIAGNOSIS — E1122 Type 2 diabetes mellitus with diabetic chronic kidney disease: Secondary | ICD-10-CM | POA: Diagnosis present

## 2021-04-04 DIAGNOSIS — E871 Hypo-osmolality and hyponatremia: Secondary | ICD-10-CM | POA: Diagnosis present

## 2021-04-04 DIAGNOSIS — J9601 Acute respiratory failure with hypoxia: Secondary | ICD-10-CM | POA: Diagnosis present

## 2021-04-04 DIAGNOSIS — Z66 Do not resuscitate: Secondary | ICD-10-CM | POA: Diagnosis present

## 2021-04-04 DIAGNOSIS — R5383 Other fatigue: Secondary | ICD-10-CM | POA: Diagnosis not present

## 2021-04-04 DIAGNOSIS — Z79899 Other long term (current) drug therapy: Secondary | ICD-10-CM

## 2021-04-04 DIAGNOSIS — J45901 Unspecified asthma with (acute) exacerbation: Secondary | ICD-10-CM | POA: Diagnosis present

## 2021-04-04 DIAGNOSIS — R0902 Hypoxemia: Secondary | ICD-10-CM | POA: Diagnosis not present

## 2021-04-04 DIAGNOSIS — J90 Pleural effusion, not elsewhere classified: Secondary | ICD-10-CM | POA: Diagnosis not present

## 2021-04-04 DIAGNOSIS — A419 Sepsis, unspecified organism: Principal | ICD-10-CM | POA: Diagnosis present

## 2021-04-04 LAB — CBC WITH DIFFERENTIAL/PLATELET
Abs Immature Granulocytes: 0.21 10*3/uL — ABNORMAL HIGH (ref 0.00–0.07)
Basophils Absolute: 0 10*3/uL (ref 0.0–0.1)
Basophils Relative: 0 %
Eosinophils Absolute: 0 10*3/uL (ref 0.0–0.5)
Eosinophils Relative: 0 %
HCT: 28.2 % — ABNORMAL LOW (ref 39.0–52.0)
Hemoglobin: 9.5 g/dL — ABNORMAL LOW (ref 13.0–17.0)
Immature Granulocytes: 1 %
Lymphocytes Relative: 3 %
Lymphs Abs: 0.5 10*3/uL — ABNORMAL LOW (ref 0.7–4.0)
MCH: 28.3 pg (ref 26.0–34.0)
MCHC: 33.7 g/dL (ref 30.0–36.0)
MCV: 83.9 fL (ref 80.0–100.0)
Monocytes Absolute: 0.4 10*3/uL (ref 0.1–1.0)
Monocytes Relative: 3 %
Neutro Abs: 15.9 10*3/uL — ABNORMAL HIGH (ref 1.7–7.7)
Neutrophils Relative %: 93 %
Platelets: 238 10*3/uL (ref 150–400)
RBC: 3.36 MIL/uL — ABNORMAL LOW (ref 4.22–5.81)
RDW: 15.3 % (ref 11.5–15.5)
WBC: 17 10*3/uL — ABNORMAL HIGH (ref 4.0–10.5)
nRBC: 0 % (ref 0.0–0.2)

## 2021-04-04 LAB — COMPREHENSIVE METABOLIC PANEL
ALT: 15 U/L (ref 0–44)
AST: 33 U/L (ref 15–41)
Albumin: 3.8 g/dL (ref 3.5–5.0)
Alkaline Phosphatase: 43 U/L (ref 38–126)
Anion gap: 8 (ref 5–15)
BUN: 33 mg/dL — ABNORMAL HIGH (ref 8–23)
CO2: 25 mmol/L (ref 22–32)
Calcium: 8.5 mg/dL — ABNORMAL LOW (ref 8.9–10.3)
Chloride: 96 mmol/L — ABNORMAL LOW (ref 98–111)
Creatinine, Ser: 1.6 mg/dL — ABNORMAL HIGH (ref 0.61–1.24)
GFR, Estimated: 41 mL/min — ABNORMAL LOW (ref >60)
Glucose, Bld: 107 mg/dL — ABNORMAL HIGH (ref 70–99)
Potassium: 3.7 mmol/L (ref 3.5–5.1)
Sodium: 129 mmol/L — ABNORMAL LOW (ref 135–145)
Total Bilirubin: 0.9 mg/dL (ref 0.3–1.2)
Total Protein: 7.3 g/dL (ref 6.5–8.1)

## 2021-04-04 LAB — RESP PANEL BY RT-PCR (FLU A&B, COVID) ARPGX2
Influenza A by PCR: NEGATIVE
Influenza B by PCR: NEGATIVE
SARS Coronavirus 2 by RT PCR: NEGATIVE

## 2021-04-04 LAB — PROCALCITONIN: Procalcitonin: 5.87 ng/mL

## 2021-04-04 LAB — LACTIC ACID, PLASMA: Lactic Acid, Venous: 1.7 mmol/L (ref 0.5–1.9)

## 2021-04-04 LAB — TROPONIN I (HIGH SENSITIVITY): Troponin I (High Sensitivity): 12 ng/L (ref <18)

## 2021-04-04 LAB — BRAIN NATRIURETIC PEPTIDE: B Natriuretic Peptide: 1185.1 pg/mL — ABNORMAL HIGH (ref 0.0–100.0)

## 2021-04-04 LAB — STREP PNEUMONIAE URINARY ANTIGEN: Strep Pneumo Urinary Antigen: NEGATIVE

## 2021-04-04 MED ORDER — ALBUTEROL SULFATE (2.5 MG/3ML) 0.083% IN NEBU: 2.5000 mg | INHALATION_SOLUTION | RESPIRATORY_TRACT | Status: DC | PRN

## 2021-04-04 MED ORDER — SODIUM CHLORIDE 0.9 % IV SOLN
2.0000 g | Freq: Once | INTRAVENOUS | Status: AC
  Administered 2021-04-04: 2 g via INTRAVENOUS
  Filled 2021-04-04: qty 20

## 2021-04-04 MED ORDER — SODIUM CHLORIDE 0.9 % IV SOLN
500.0000 mg | INTRAVENOUS | Status: DC
  Administered 2021-04-05 – 2021-04-07 (×3): 500 mg via INTRAVENOUS
  Filled 2021-04-04 (×4): qty 500

## 2021-04-04 MED ORDER — ONDANSETRON HCL 4 MG/2ML IJ SOLN: 4.0000 mg | Freq: Three times a day (TID) | INTRAMUSCULAR | Status: DC | PRN

## 2021-04-04 MED ORDER — METHYLPREDNISOLONE SODIUM SUCC 40 MG IJ SOLR
40.0000 mg | Freq: Two times a day (BID) | INTRAMUSCULAR | Status: DC
  Administered 2021-04-05 – 2021-04-06 (×2): 40 mg via INTRAVENOUS
  Filled 2021-04-04 (×2): qty 1

## 2021-04-04 MED ORDER — SODIUM CHLORIDE 0.9 % IV SOLN
2.0000 g | INTRAVENOUS | Status: DC
  Administered 2021-04-05 – 2021-04-07 (×3): 2 g via INTRAVENOUS
  Filled 2021-04-04: qty 20
  Filled 2021-04-04: qty 2
  Filled 2021-04-04: qty 20
  Filled 2021-04-04: qty 2

## 2021-04-04 MED ORDER — SODIUM CHLORIDE 0.9 % IV SOLN: INTRAVENOUS | Status: DC

## 2021-04-04 MED ORDER — ASPIRIN 325 MG PO TABS
325.0000 mg | ORAL_TABLET | Freq: Every day | ORAL | Status: DC
  Administered 2021-04-04 – 2021-04-08 (×5): 325 mg via ORAL
  Filled 2021-04-04 (×6): qty 1

## 2021-04-04 MED ORDER — IPRATROPIUM-ALBUTEROL 0.5-2.5 (3) MG/3ML IN SOLN
3.0000 mL | Freq: Once | RESPIRATORY_TRACT | Status: AC
  Administered 2021-04-04: 3 mL via RESPIRATORY_TRACT
  Filled 2021-04-04: qty 3

## 2021-04-04 MED ORDER — PANTOPRAZOLE SODIUM 40 MG PO TBEC
40.0000 mg | DELAYED_RELEASE_TABLET | Freq: Every day | ORAL | Status: DC
  Administered 2021-04-05 – 2021-04-08 (×4): 40 mg via ORAL
  Filled 2021-04-04 (×4): qty 1

## 2021-04-04 MED ORDER — ENOXAPARIN SODIUM 40 MG/0.4ML IJ SOSY
40.0000 mg | PREFILLED_SYRINGE | INTRAMUSCULAR | Status: DC
  Administered 2021-04-04 – 2021-04-07 (×4): 40 mg via SUBCUTANEOUS
  Filled 2021-04-04 (×4): qty 0.4

## 2021-04-04 MED ORDER — SODIUM CHLORIDE 0.9 % IV BOLUS
1000.0000 mL | Freq: Once | INTRAVENOUS | Status: AC
  Administered 2021-04-04: 1000 mL via INTRAVENOUS

## 2021-04-04 MED ORDER — SODIUM CHLORIDE 0.9 % IV SOLN
500.0000 mg | Freq: Once | INTRAVENOUS | Status: AC
  Administered 2021-04-04: 500 mg via INTRAVENOUS
  Filled 2021-04-04: qty 500

## 2021-04-04 MED ORDER — ACETAMINOPHEN 325 MG PO TABS: 650.0000 mg | ORAL_TABLET | Freq: Four times a day (QID) | ORAL | Status: DC | PRN

## 2021-04-04 MED ORDER — HYDRALAZINE HCL 20 MG/ML IJ SOLN: 5.0000 mg | INTRAMUSCULAR | Status: DC | PRN

## 2021-04-04 MED ORDER — IPRATROPIUM-ALBUTEROL 0.5-2.5 (3) MG/3ML IN SOLN
3.0000 mL | RESPIRATORY_TRACT | Status: DC
  Administered 2021-04-04 – 2021-04-05 (×4): 3 mL via RESPIRATORY_TRACT
  Filled 2021-04-04 (×3): qty 3

## 2021-04-04 MED ORDER — DM-GUAIFENESIN ER 30-600 MG PO TB12
1.0000 | ORAL_TABLET | Freq: Two times a day (BID) | ORAL | Status: DC | PRN
  Administered 2021-04-06: 1 via ORAL
  Filled 2021-04-04 (×2): qty 1

## 2021-04-04 MED ORDER — SODIUM CHLORIDE 1 G PO TABS
1.0000 g | ORAL_TABLET | Freq: Two times a day (BID) | ORAL | Status: DC
  Administered 2021-04-04: 1 g via ORAL
  Filled 2021-04-04 (×2): qty 1

## 2021-04-04 MED ORDER — METHYLPREDNISOLONE SODIUM SUCC 125 MG IJ SOLR
125.0000 mg | Freq: Once | INTRAMUSCULAR | Status: AC
  Administered 2021-04-04: 125 mg via INTRAVENOUS
  Filled 2021-04-04: qty 2

## 2021-04-04 NOTE — ED Provider Notes (Signed)
Emergency Medicine Provider Triage Evaluation Note  Craig Vazquez , a 87 y.o. male  was evaluated in triage.  Pt complains of SOB, fatigue, and low sats. He presents from [facility] for evaluation of his symptoms including decreased appetite. He is on O2 at home.   Review of Systems  Positive: SOB, fatigue Negative: CP, FCS  Physical Exam  BP 123/66 (BP Location: Left Arm)   Pulse 85   Temp 98.9 F (37.2 C) (Oral)   Resp (!) 22   SpO2 95%  Gen:   Awake, no distress  NAD Resp:  Normal effort CTA MSK:   Moves extremities without difficulty  Other:  CVS: RRR  Medical Decision Making  Medically screening exam initiated at 1:34 PM.  Appropriate orders placed.  Craig Vazquez was informed that the remainder of the evaluation will be completed by another provider, this initial triage assessment does not replace that evaluation, and the importance of remaining in the ED until their evaluation is complete.  Patient with ED evaluation of hypoxia, fatigue, and decreased appetite.    Craig Needles, PA-C 04/04/21 1337    Rada Hay, MD 04/04/21 1620

## 2021-04-04 NOTE — Progress Notes (Signed)
Elink following for code sepsis 

## 2021-04-04 NOTE — ED Notes (Signed)
Assumed care of patient, linen changed performed. Pt assisted to a position of comfort, moved up in bed to optimal position for respiratory drive. Belongings bagged at bedside. Pt denies other needs at this time. Call light in reach.

## 2021-04-04 NOTE — Consult Note (Signed)
CODE SEPSIS - PHARMACY COMMUNICATION  **[facility] Spectrum Antibiotics should be administered within 1 hour of Sepsis diagnosis**  Time Code Sepsis Called/Page Received: 1040  Antibiotics Ordered: 4591  Time of 1st antibiotic administration: [place] Seichi Kaufhold ,PharmD, BCPS Clinical Pharmacist  04/04/2021  3:45 PM

## 2021-04-04 NOTE — H&P (Signed)
History and Physical    VITALIY EISENHOUR YJE:[id number] DOB: 07/24/1930 DOA: 04/04/2021  Referring MD/NP/PA:   PCP: Wenda Low, MD   Patient coming from:  The patient is coming from home.    Chief Complaint: Cough and shortness of breath  HPI: DEMETRIO LEIGHTY is a 87 y.o. male with medical history significant of borderline diabetes mellitus, asthma not on oxygen at home, stroke, GERD, vertigo, left inguinal hernia, CKD-3A, hard of hearing, anxiety, who presents with shortness of breath.  Patient states he has shortness of breath for more than 3 days, which has been progressively worsening.  Patient has productive cough with yellow/brown sputum production, patient has poor oral appetite and decreased oral intake. Patient is lethargic, but is still oriented x3, has generalized weakness.  Patient has chills, no fever.  No chest pain.  Denies nausea, vomiting, diarrhea or abdominal pain.  No symptoms of UTI.  Patient was found to have oxygen desaturating to 86% on room air, which improved to 98% on 3 L oxygen in ED.  ED Course: pt was found to have WBC 17.0, lactic acid 1.7, troponin level 12, BNP level 1185, pending COVID-19 PCR, renal function close to baseline, temperature normal, blood pressure 153/60, heart rate 110, RR 26.  Chest x-ray showed bilateral basilar infiltration.  Patient is admitted to progressive bed as inpatient  Review of Systems:   General: no fevers, has chills, no body weight gain, has poor appetite, has fatigue HEENT: no blurry vision, hearing changes or sore throat Respiratory: Has dyspnea, coughing, wheezing CV: no chest pain, no palpitations GI: no nausea, vomiting, abdominal pain, diarrhea, constipation GU: no dysuria, burning on urination, increased urinary frequency, hematuria  Ext: no leg edema Neuro: no unilateral weakness, numbness, or tingling, no vision change or hearing loss Skin: no rash, no skin tear. MSK: No muscle spasm, no deformity, no  limitation of range of movement in spin Heme: No easy bruising.  Travel history: No recent long distant travel.  Allergy: No Known Allergies  Past Medical History:  Diagnosis Date   Arthritis 2017   R Knee   Asthma    Diabetes mellitus without complication ([place])    Borderline diabetic   History of hiatal hernia 2019   Hypoglycemia    Recurrent irreducible left inguinal hernia 04/05/2018   Stroke ([place])    5.1.14   Vertigo     Past Surgical History:  Procedure Laterality Date   DENTAL SURGERY  2008   implants upper and lower   ESOPHAGOGASTRODUODENOSCOPY (EGD) WITH PROPOFOL N/A 08/13/2018   Procedure: ESOPHAGOGASTRODUODENOSCOPY (EGD) WITH PROPOFOL;  Surgeon: Otis Brace, MD;  Location: [facility];  Service: Gastroenterology;  Laterality: N/A;   EYE SURGERY Bilateral    Cataract   HAND SURGERY     rt hand   herina     HERNIA REPAIR     INGUINAL HERNIA REPAIR  04/05/2018   OPEN    INGUINAL HERNIA REPAIR Left 04/05/2018   Procedure: OPEN REPAIR OF LEFT INGUINAL HERNIA;  Surgeon: Fanny Skates, MD;  Location: [place];  Service: General;  Laterality: Left;   INGUINAL HERNIA REPAIR Right 05/11/2020   Procedure: REPAIR OF RIGHT INGUINAL HERNIA WITH MESH;  Surgeon: Erroll Luna, MD;  Location: [place];  Service: General;  Laterality: Right;   INSERTION OF MESH Left 04/05/2018   Procedure: INSERTION OF MESH;  Surgeon: Fanny Skates, MD;  Location: [place];  Service: General;  Laterality: Left;   INSERTION OF MESH Right 05/11/2020  Procedure: INSERTION OF MESH;  Surgeon: Erroll Luna, MD;  Location: [place];  Service: General;  Laterality: Right;   LAPAROSCOPIC GASTROTOMY [place]  2017   TEE WITHOUT CARDIOVERSION N/A 01/16/2013   Procedure: TRANSESOPHAGEAL ECHOCARDIOGRAM (TEE);  Surgeon: Thayer Headings, MD;  Location: [place];  Service: Cardiovascular;  Laterality: N/A;    Social History:  reports that he quit smoking about 52 years ago. He  has never used smokeless tobacco. He reports current alcohol use. He reports that he does not use drugs.  Family History:  Family History  Problem Relation Age of Onset   Cancer Brother        lung ca x 2 brothers     Prior to Admission medications   Medication Sig Start Date End Date Taking? Authorizing Provider  aspirin 325 MG tablet Take 1 tablet (325 mg total) by mouth daily. 11/09/12  Yes Stoneking, Hal, MD  Fluticasone Propionate, Inhal, 100 MCG/BLIST AEPB Inhale 1 Inhaler into the lungs 2 (two) times daily as needed (for respiratory issues.).    Yes [provider]  pantoprazole (PROTONIX) 40 MG tablet Take 40 mg by mouth daily before breakfast.   Yes [provider]  fluticasone (FLONASE) 50 MCG/ACT nasal spray Place 1 spray into the nose daily as needed for allergies.     [provider]  HYDROcodone-acetaminophen (NORCO/VICODIN) 5-325 MG tablet Take 1-2 tablets by mouth every 4 (four) hours as needed for moderate pain. Patient not taking: No sig reported 05/12/20   Erroll Luna, MD  hydrOXYzine (ATARAX/VISTARIL) 10 MG tablet Take 10 mg by mouth 2 (two) times daily. 03/31/21   [provider]  triamcinolone cream (KENALOG) 0.1 % Apply 1 application topically daily as needed (dry skin).     [provider]    Physical Exam: Vitals:   04/04/21 1307 04/04/21 1308 04/04/21 1442 04/04/21 1445  BP: 123/66  (!) 153/60 (!) 153/60  Pulse: 85  (!) 110 92  Resp: (!) 22  (!) 25 (!) 26  Temp: 98.9 F (37.2 C)  98.1 F (36.7 C)   TempSrc: Oral  Oral   SpO2: (!) 86% 95% 92% 98%   General: Not in acute distress HEENT:       Eyes: PERRL, EOMI, no scleral icterus.       ENT: No discharge from the ears and nose, no pharynx injection, no tonsillar enlargement.        Neck: Has positive JVD, no bruit, no mass felt. Heme: No neck lymph node enlargement. Cardiac: S1/S2, RRR, has murmurs, No gallops or rubs. Respiratory: Has wheezing  bilaterally GI: Soft, nondistended, nontender, no rebound pain, no organomegaly, BS present. GU: No hematuria Ext: No pitting leg edema bilaterally. 1+DP/PT pulse bilaterally. Musculoskeletal: No joint deformities, No joint redness or warmth, no limitation of ROM in spin. Skin: No rashes.  Neuro: Alert, oriented X3, cranial nerves II-XII grossly intact, moves all extremities normally.  Psych: Patient is not psychotic, no suicidal or hemocidal ideation.  Labs on Admission: I have personally reviewed following labs and imaging studies  CBC: Recent Labs  Lab 04/04/21 1319  WBC 17.0*  NEUTROABS 15.9*  HGB 9.5*  HCT 28.2*  MCV 83.9  PLT 132   Basic Metabolic Panel: Recent Labs  Lab 04/04/21 1319  NA 129*  K 3.7  CL 96*  CO2 25  GLUCOSE 107*  BUN 33*  CREATININE 1.60*  CALCIUM 8.5*   GFR: CrCl  cannot be calculated (Unknown ideal weight.). Liver Function Tests: Recent Labs  Lab 04/04/21 1319  AST 33  ALT 15  ALKPHOS 43  BILITOT 0.9  PROT 7.3  ALBUMIN 3.8   No results for input(s): LIPASE, AMYLASE in the last 168 hours. No results for input(s): AMMONIA in the last 168 hours. Coagulation Profile: No results for input(s): INR, PROTIME in the last 168 hours. Cardiac Enzymes: No results for input(s): CKTOTAL, CKMB, CKMBINDEX, TROPONINI in the last 168 hours. BNP (last 3 results) No results for input(s): PROBNP in the last 8760 hours. HbA1C: No results for input(s): HGBA1C in the last 72 hours. CBG: No results for input(s): GLUCAP in the last 168 hours. Lipid Profile: No results for input(s): CHOL, HDL, LDLCALC, TRIG, CHOLHDL, LDLDIRECT in the last 72 hours. Thyroid Function Tests: No results for input(s): TSH, T4TOTAL, FREET4, T3FREE, THYROIDAB in the last 72 hours. Anemia Panel: No results for input(s): VITAMINB12, FOLATE, FERRITIN, TIBC, IRON, RETICCTPCT in the last 72 hours. Urine analysis:    Component Value Date/Time   COLORURINE AMBER (A) 05/15/2020 2103    APPEARANCEUR HAZY (A) 05/15/2020 2103   LABSPEC 1.021 05/15/2020 2103   PHURINE 5.0 05/15/2020 2103   GLUCOSEU NEGATIVE 05/15/2020 2103   HGBUR NEGATIVE 05/15/2020 2103   BILIRUBINUR NEGATIVE 05/15/2020 2103   [place] NEGATIVE 05/15/2020 2103   PROTEINUR 30 (A) 05/15/2020 2103   NITRITE NEGATIVE 05/15/2020 2103   LEUKOCYTESUR NEGATIVE 05/15/2020 2103   Sepsis Labs: @LABRCNTIP (procalcitonin:4,lacticidven:4) ) Recent Results (from the past 240 hour(s))  Resp Panel by RT-PCR (Flu A&B, Covid) Nasopharyngeal Swab     Status: None   Collection Time: 04/04/21  3:44 PM   Specimen: Nasopharyngeal Swab; Nasopharyngeal(NP) swabs in vial transport medium  Result Value Ref Range Status   SARS Coronavirus 2 by RT PCR NEGATIVE NEGATIVE Final    Comment: (NOTE) SARS-CoV-2 target nucleic acids are NOT DETECTED.  The SARS-CoV-2 RNA is generally detectable in upper respiratory specimens during the acute phase of infection. The lowest concentration of SARS-CoV-2 viral copies this assay can detect is 138 copies/mL. A negative result does not preclude SARS-Cov-2 infection and should not be used as the sole basis for treatment or other patient management decisions. A negative result may occur with  improper specimen collection/handling, submission of specimen other than nasopharyngeal swab, presence of viral mutation(s) within the areas targeted by this assay, and inadequate number of viral copies(<138 copies/mL). A negative result must be combined with clinical observations, patient history, and epidemiological information. The expected result is Negative.  Fact Sheet for Patients:  EntrepreneurPulse.com.au  Fact Sheet for Healthcare Providers:  IncredibleEmployment.be  This test is no t yet approved or cleared by the [place] FDA and  has been authorized for detection and/or diagnosis of SARS-CoV-2 by FDA under an Emergency Use Authorization (EUA).  This EUA will remain  in effect (meaning this test can be used) for the duration of the COVID-19 declaration under Section 564(b)(1) of the Act, 21 U.S.C.section 360bbb-3(b)(1), unless the authorization is terminated  or revoked sooner.       Influenza A by PCR NEGATIVE NEGATIVE Final   Influenza B by PCR NEGATIVE NEGATIVE Final    Comment: (NOTE) The Xpert Xpress SARS-CoV-2/FLU/RSV plus assay is intended as an aid in the diagnosis of influenza from Nasopharyngeal swab specimens and should not be used as a sole basis for treatment. Nasal washings and aspirates are unacceptable for Xpert Xpress SARS-CoV-2/FLU/RSV testing.  Fact Sheet for Patients: EntrepreneurPulse.com.au  Fact  Sheet for Healthcare Providers: IncredibleEmployment.be  This test is not yet approved or cleared by the [place] and has been authorized for detection and/or diagnosis of SARS-CoV-2 by FDA under an Emergency Use Authorization (EUA). This EUA will remain in effect (meaning this test can be used) for the duration of the COVID-19 declaration under Section 564(b)(1) of the Act, 21 U.S.C. section 360bbb-3(b)(1), unless the authorization is terminated or revoked.  Performed at [facility], [address] [place], [place] [address]      Radiological Exams on Admission: DG Chest 2 View  Result Date: 04/04/2021 CLINICAL DATA:  Hypoxia fatigue and decreased appetite in an 87 year old male. EXAM: CHEST - 2 VIEW COMPARISON:  May 15, 2020. FINDINGS: Trachea is midline. Cardiomediastinal contours and hilar structures are unremarkable and unchanged. Partially obscured LEFT heart border and LEFT hemidiaphragm with ill-defined airspace disease in the LEFT and RIGHT lung base. Small bilateral pleural effusions. On limited assessment there is no acute skeletal process. IMPRESSION: Findings suspicious for bibasilar pneumonia or aspiration related changes. Small  effusions. Electronically Signed   By: Zetta Bills M.D.   On: 04/04/2021 14:21     EKG: I have personally reviewed.  Sinus rhythm, bifascicular block, QTC 433, poor R wave progression  Assessment/Plan Principal Problem:   Acute respiratory failure with hypoxia (HCC) Active Problems:   CAP (community acquired pneumonia)   Asthma exacerbation   Sepsis ([place])   CKD (chronic kidney disease), stage IIIa   Stroke (HCC)   GERD (gastroesophageal reflux disease)   Hyponatremia   Acute respiratory failure with hypoxia and sepsis due to CAP and asthma exacerbation: Patient meets criteria for sepsis with WBC 17.0, tachycardia with heart rate 110, tachypnea with RR 26.  Lactic acid is normal.  Hemodynamically stable currently.  Patient has elevated BNP 1185 and JVD, but no leg edema.  He had 2D echo on 01/16/2013 which showed EF 60-65%.  Will repeat 2D echo today.  -Admitted to progressive unit as inpatient - Solu-Medrol 40 mg twice daily - IV Rocephin and azithromycin - Mucinex for cough  - Bronchodilators - Urine legionella and S. pneumococcal antigen - Follow up blood culture x2, sputum culture  - will get Procalcitonin - IVF: 1L of NS bolus. - f/u 2d echo  CKD (chronic kidney disease), stage IIIa: Recently baseline creatinine 1.4-1.5, his creatinine is 1.60, BUN 23.  Renal function close to baseline -f/u with BMP  Stroke ([place]) -Continue aspirin  GERD (gastroesophageal reflux disease) -Protonix  Hyponatremia: Sodium 129, likely due to decreased oral intake and dehydration -Patient received 1 L normal saline -Restart sodium chloride tablet 1 g twice daily         DVT ppx: SQ Lovenox Code Status:  DNR per pt and his daughter Family Communication:  Yes, patient's daughter  at bed side Disposition Plan:  Anticipate discharge back to previous environment Consults called:  none Admission status and Level of care: Progressive Cardiac:   as inpt       Status is:  Inpatient  Remains inpatient appropriate because:Inpatient level of care appropriate due to severity of illness  Dispo: The patient is from: Home              Anticipated d/c is to: Home              Patient currently is not medically stable to d/c.   Difficult to place patient No         Date of Service 04/04/2021  Ivor Costa [facility]   If 7PM-7AM, please contact night-coverage [email or web address] 04/04/2021, 5:56 PM

## 2021-04-04 NOTE — ED Provider Notes (Signed)
[facility] Emergency Department Provider Note  ____________________________________________   Event Date/Time   First MD Initiated Contact with Patient 04/04/21 1506     (approximate)  I have reviewed the triage vital signs and the nursing notes.   HISTORY  Chief Complaint Shortness of Breath    HPI Craig Vazquez is a 87 y.o. male with past medical history as below including chronic asthma here with cough and shortness of breath.  Over the last 3 days, the patient has had progressively worsening cough, shortness of breath, and general fatigue.  He has been producing copious amounts of yellow/brown sputum.  He has had decreased appetite.  Has been generally weak and had difficulty getting around 9 house.  He has had some shortness of breath, particularly with exertion.  He has had general malaise.  No known fevers but has had some chills.  Temperature was 99 which is above what he normally has per family.  No known sick contacts.  Lives with his daughter, who is a Radio producer, however.  No known COVID exposures.  He had a negative home COVID test.  No specific alleviating factors.    Past Medical History:  Diagnosis Date   Arthritis 2017   R Knee   Asthma    Diabetes mellitus without complication ([place])    Borderline diabetic   History of hiatal hernia 2019   Hypoglycemia    Recurrent irreducible left inguinal hernia 04/05/2018   Stroke ([place])    5.1.14   Vertigo     Patient Active Problem List   Diagnosis Date Noted   Right inguinal hernia 05/11/2020   Primary osteoarthritis of left knee 09/23/2019   Recurrent incarcerated left inguinal hernia s/p open repair 04/06/2018 04/05/2018   Stroke of unknown etiology ([place]) 12/12/2012   Asthma, chronic 11/08/2012   BPPV (benign paroxysmal positional vertigo) 11/08/2012   Expressive aphasia 11/07/2012   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    Past Surgical History:  Procedure Laterality Date   DENTAL SURGERY  2008    implants upper and lower   ESOPHAGOGASTRODUODENOSCOPY (EGD) WITH PROPOFOL N/A 08/13/2018   Procedure: ESOPHAGOGASTRODUODENOSCOPY (EGD) WITH PROPOFOL;  Surgeon: Otis Brace, MD;  Location: [facility];  Service: Gastroenterology;  Laterality: N/A;   EYE SURGERY Bilateral    Cataract   HAND SURGERY     rt hand   [place]     HERNIA REPAIR     INGUINAL HERNIA REPAIR  04/05/2018   OPEN    INGUINAL HERNIA REPAIR Left 04/05/2018   Procedure: OPEN REPAIR OF LEFT INGUINAL HERNIA;  Surgeon: Fanny Skates, MD;  Location: [place];  Service: General;  Laterality: Left;   INGUINAL HERNIA REPAIR Right 05/11/2020   Procedure: REPAIR OF RIGHT INGUINAL HERNIA WITH MESH;  Surgeon: Erroll Luna, MD;  Location: [place];  Service: General;  Laterality: Right;   INSERTION OF MESH Left 04/05/2018   Procedure: INSERTION OF MESH;  Surgeon: Fanny Skates, MD;  Location: [place];  Service: General;  Laterality: Left;   INSERTION OF MESH Right 05/11/2020   Procedure: INSERTION OF MESH;  Surgeon: Erroll Luna, MD;  Location: [place];  Service: General;  Laterality: Right;   LAPAROSCOPIC GASTROTOMY [place] SURGERY  2017   TEE WITHOUT CARDIOVERSION N/A 01/16/2013   Procedure: TRANSESOPHAGEAL ECHOCARDIOGRAM (TEE);  Surgeon: Thayer Headings, MD;  Location: [place];  Service: Cardiovascular;  Laterality: N/A;    Prior to Admission medications   Medication Sig Start  Date End Date Taking? Authorizing Provider  aspirin 325 MG tablet Take 1 tablet (325 mg total) by mouth daily. 11/09/12  Yes Stoneking, Hal, MD  Fluticasone Propionate, Inhal, 100 MCG/BLIST AEPB Inhale 1 Inhaler into the lungs 2 (two) times daily as needed (for respiratory issues.).    Yes [provider]  pantoprazole (PROTONIX) 40 MG tablet Take 40 mg by mouth daily before breakfast.   Yes [provider]  fluticasone (FLONASE) 50 MCG/ACT nasal spray Place 1 spray into the nose daily as needed for allergies.      [provider]  HYDROcodone-acetaminophen (NORCO/VICODIN) 5-325 MG tablet Take 1-2 tablets by mouth every 4 (four) hours as needed for moderate pain. Patient not taking: No sig reported 05/12/20   Erroll Luna, MD  hydrOXYzine (ATARAX/VISTARIL) 10 MG tablet Take 10 mg by mouth 2 (two) times daily. 03/31/21   [provider]  triamcinolone cream (KENALOG) 0.1 % Apply 1 application topically daily as needed (dry skin).     [provider]    Allergies Patient has no known allergies.  Family History  Problem Relation Age of Onset   Cancer Brother        lung ca x 2 brothers    Social History Social History   Tobacco Use   Smoking status: Former    Types: Cigarettes    Quit date: 1970    Years since quitting: 52.7   Smokeless tobacco: Never   Tobacco comments:    quit 1970  Vaping Use   Vaping Use: Never used  Substance Use Topics   Alcohol use: Yes    Comment: BEER/WINE OCC   Drug use: No    Review of Systems  Review of Systems  Constitutional:  Positive for chills and fatigue. Negative for fever.  HENT:  Negative for sore throat.   Respiratory:  Positive for cough, shortness of breath and wheezing.   Cardiovascular:  Negative for chest pain.  Gastrointestinal:  Negative for abdominal pain.  Genitourinary:  Negative for flank pain.  Musculoskeletal:  Positive for gait problem (Due to weakness). Negative for neck pain.  Skin:  Negative for rash and wound.  Allergic/Immunologic: Negative for immunocompromised state.  Neurological:  Negative for weakness and numbness.  Hematological:  Does not bruise/bleed easily.  All other systems reviewed and are negative.   ____________________________________________  PHYSICAL EXAM:      VITAL SIGNS: ED Triage Vitals  Enc Vitals Group     BP 04/04/21 1307 123/66     Pulse Rate 04/04/21 1307 85     Resp 04/04/21 1307 (!) 22     Temp 04/04/21 1307 98.9 F (37.2 C)     Temp Source 04/04/21 1307  Oral     SpO2 04/04/21 1307 (!) 86 %     Weight --      Height --      Head Circumference --      Peak Flow --      Pain Score 04/04/21 1308 0     Pain Loc --      Pain Edu? --      Excl. in [place]? --      Physical Exam Vitals and nursing note reviewed.  Constitutional:      General: He is not in acute distress.    Appearance: He is well-developed.  HENT:     Head: Normocephalic and atraumatic.  Eyes:     Conjunctiva/sclera: Conjunctivae normal.  Cardiovascular:     Rate and  Rhythm: Regular rhythm. Tachycardia present.     Heart sounds: Normal heart sounds. No murmur heard.   No friction rub.  Pulmonary:     Effort: Pulmonary effort is normal. Tachypnea present. No respiratory distress.     Breath sounds: Examination of the right-lower field reveals rales. Examination of the left-lower field reveals rales. Decreased breath sounds, rhonchi and rales present. No wheezing.  Abdominal:     General: There is no distension.     Palpations: Abdomen is soft.     Tenderness: There is no abdominal tenderness.  Musculoskeletal:     Cervical back: Neck supple.  Skin:    General: Skin is warm and dry.     Capillary Refill: Capillary refill takes less than 2 seconds.  Neurological:     Mental Status: He is alert and oriented to person, place, and time.     Motor: No abnormal muscle tone.      ____________________________________________   LABS (all labs ordered are listed, but only abnormal results are displayed)  Labs Reviewed  CBC WITH DIFFERENTIAL/PLATELET - Abnormal; Notable for the following components:      Result Value   WBC 17.0 (*)    RBC 3.36 (*)    Hemoglobin 9.5 (*)    HCT 28.2 (*)    Neutro Abs 15.9 (*)    Lymphs Abs 0.5 (*)    Abs Immature Granulocytes 0.21 (*)    All other components within normal limits  COMPREHENSIVE METABOLIC PANEL - Abnormal; Notable for the following components:   Sodium 129 (*)    Chloride 96 (*)    Glucose, Bld 107 (*)    BUN 33  (*)    Creatinine, Ser 1.60 (*)    Calcium 8.5 (*)    GFR, Estimated 41 (*)    All other components within normal limits  CULTURE, BLOOD (ROUTINE X 2)  CULTURE, BLOOD (ROUTINE X 2)  RESP PANEL BY RT-PCR (FLU A&B, COVID) ARPGX2  EXPECTORATED SPUTUM ASSESSMENT W GRAM STAIN, RFLX TO RESP C  LACTIC ACID, PLASMA  LACTIC ACID, PLASMA  BRAIN NATRIURETIC PEPTIDE  LEGIONELLA PNEUMOPHILA SEROGP 1 UR AG  STREP PNEUMONIAE URINARY ANTIGEN  TROPONIN I (HIGH SENSITIVITY)    ____________________________________________  EKG: Sinus rhythm with first-degree AV block.  Right bundle branch block.  Ventricular rate 89, PR 334, QRS 130, QTc 433.  Minimal voltage criteria for LVH.  No overt ST elevations. ________________________________________  RADIOLOGY All imaging, including plain films, CT scans, and ultrasounds, independently reviewed by me, and interpretations confirmed via formal radiology reads.  ED MD interpretation:   Chest x-ray: Bibasilar pneumonia  Official radiology report(s): DG Chest 2 View  Result Date: 04/04/2021 CLINICAL DATA:  Hypoxia fatigue and decreased appetite in an 87 year old male. EXAM: CHEST - 2 VIEW COMPARISON:  May 15, 2020. FINDINGS: Trachea is midline. Cardiomediastinal contours and hilar structures are unremarkable and unchanged. Partially obscured LEFT heart border and LEFT hemidiaphragm with ill-defined airspace disease in the LEFT and RIGHT lung base. Small bilateral pleural effusions. On limited assessment there is no acute skeletal process. IMPRESSION: Findings suspicious for bibasilar pneumonia or aspiration related changes. Small effusions. Electronically Signed   By: Zetta Bills M.D.   On: 04/04/2021 14:21    ____________________________________________  PROCEDURES   Procedure(s) performed (including Critical Care):  .Critical Care Performed by: Duffy Bruce, MD Authorized by: Duffy Bruce, MD   Critical care provider statement:     Critical care time (minutes):  35   Critical care  time was exclusive of:  Separately billable procedures and treating other patients and teaching time   Critical care was necessary to treat or prevent imminent or life-threatening deterioration of the following conditions:  Cardiac failure, circulatory failure, respiratory failure and sepsis   Critical care was time spent personally by me on the following activities:  Development of treatment plan with patient or surrogate, discussions with consultants, evaluation of patient's response to treatment, examination of patient, obtaining history from patient or surrogate, ordering and performing treatments and interventions, ordering and review of laboratory studies, ordering and review of radiographic studies, pulse oximetry, re-evaluation of patient's condition and review of old charts   I assumed direction of critical care for this patient from another provider in my specialty: no    ____________________________________________  INITIAL IMPRESSION / MDM / [place] / ED COURSE  As part of my medical decision making, I reviewed the following data within the [place] notes reviewed and incorporated, Old chart reviewed, Notes from prior ED visits, and [place] Controlled Substance Database       *FALLOU HULBERT was evaluated in Emergency Department on 04/04/2021 for the symptoms described in the history of present illness. He was evaluated in the context of the global COVID-19 pandemic, which necessitated consideration that the patient might be at risk for infection with the SARS-CoV-2 virus that causes COVID-19. Institutional protocols and algorithms that pertain to the evaluation of patients at risk for COVID-19 are in a state of rapid change based on information released by regulatory bodies including the CDC and federal and state organizations. These policies and algorithms were followed during the patient's care in the ED.   Some ED evaluations and interventions may be delayed as a result of limited staffing during the pandemic.*     Medical Decision Making: 87 year old male here with acute hypoxic respiratory failure due to community-acquired pneumonia.  Chest x-ray reviewed, there is bibasilar pneumonia.  Patient has significant leukocytosis and mild hyponatremia which I suspect is due to hypovolemia from poor p.o. intake. Meets Sepsis criteria.  Will check COVID, also urine strep and Legionella, particularly given his hyponatremia, and plan to admit.  Community-acquired pneumonia empiric antibiotics started, as well as fluids.  Patient also has a history of asthma with wheezing, will give a dose of steroids and a DuoNeb.  ____________________________________________  FINAL CLINICAL IMPRESSION(S) / ED DIAGNOSES  Final diagnoses:  Community acquired pneumonia, unspecified laterality  Acute respiratory failure with hypoxia ([place])     MEDICATIONS GIVEN DURING THIS VISIT:  Medications  cefTRIAXone (ROCEPHIN) 2 g in sodium chloride 0.9 % 100 mL IVPB (has no administration in time range)  azithromycin (ZITHROMAX) 500 mg in sodium chloride 0.9 % 250 mL IVPB (has no administration in time range)  sodium chloride 0.9 % bolus 1,000 mL (has no administration in time range)  methylPREDNISolone sodium succinate (SOLU-MEDROL) 125 mg/2 mL injection 125 mg (has no administration in time range)  ipratropium-albuterol (DUONEB) 0.5-2.5 (3) MG/3ML nebulizer solution 3 mL (has no administration in time range)     ED Discharge Orders     None        Note:  This document was prepared using Dragon voice recognition software and may include unintentional dictation errors.   Duffy Bruce, MD 04/04/21 [phone]

## 2021-04-04 NOTE — ED Triage Notes (Signed)
Pt presents to ED from [facility] where he was found to be hypoxic with SaO2 85% RA. Pt reports lethargy, decreased fluid intake, SOB since Saturday. SaO2 86% on 2L in triage, improved to 95% on 3L.

## 2021-04-05 ENCOUNTER — Encounter: Payer: Self-pay | Admitting: Internal Medicine

## 2021-04-05 ENCOUNTER — Inpatient Hospital Stay (HOSPITAL_COMMUNITY)
Admit: 2021-04-05 | Discharge: 2021-04-05 | Disposition: A | Discharge status: Home / Self Care | Payer: Medicare Other | Attending: Internal Medicine | Admitting: Internal Medicine

## 2021-04-05 ENCOUNTER — Other Ambulatory Visit: Payer: Self-pay

## 2021-04-05 DIAGNOSIS — J9601 Acute respiratory failure with hypoxia: Secondary | ICD-10-CM | POA: Diagnosis not present

## 2021-04-05 DIAGNOSIS — I5031 Acute diastolic (congestive) heart failure: Secondary | ICD-10-CM

## 2021-04-05 LAB — ECHOCARDIOGRAM COMPLETE
AR max vel: 2.62 cm2
AV Area VTI: 2.8 cm2
AV Area mean vel: 2.49 cm2
AV Mean grad: 2 mmHg
AV Peak grad: 3.6 mmHg
Ao pk vel: 0.95 m/s
Area-P 1/2: 10.84 cm2
S' Lateral: 2.9 cm

## 2021-04-05 LAB — BASIC METABOLIC PANEL
Anion gap: 11 (ref 5–15)
BUN: 32 mg/dL — ABNORMAL HIGH (ref 8–23)
CO2: 24 mmol/L (ref 22–32)
Calcium: 8.7 mg/dL — ABNORMAL LOW (ref 8.9–10.3)
Chloride: 104 mmol/L (ref 98–111)
Creatinine, Ser: 1.42 mg/dL — ABNORMAL HIGH (ref 0.61–1.24)
GFR, Estimated: 48 mL/min — ABNORMAL LOW (ref >60)
Glucose, Bld: 158 mg/dL — ABNORMAL HIGH (ref 70–99)
Potassium: 3.9 mmol/L (ref 3.5–5.1)
Sodium: 139 mmol/L (ref 135–145)

## 2021-04-05 LAB — CBC
HCT: 27.4 % — ABNORMAL LOW (ref 39.0–52.0)
Hemoglobin: 8.8 g/dL — ABNORMAL LOW (ref 13.0–17.0)
MCH: 27.2 pg (ref 26.0–34.0)
MCHC: 32.1 g/dL (ref 30.0–36.0)
MCV: 84.8 fL (ref 80.0–100.0)
Platelets: 196 10*3/uL (ref 150–400)
RBC: 3.23 MIL/uL — ABNORMAL LOW (ref 4.22–5.81)
RDW: 15.4 % (ref 11.5–15.5)
WBC: 11.4 10*3/uL — ABNORMAL HIGH (ref 4.0–10.5)
nRBC: 0 % (ref 0.0–0.2)

## 2021-04-05 MED ORDER — IPRATROPIUM-ALBUTEROL 0.5-2.5 (3) MG/3ML IN SOLN
3.0000 mL | Freq: Four times a day (QID) | RESPIRATORY_TRACT | Status: DC
  Administered 2021-04-05 – 2021-04-06 (×4): 3 mL via RESPIRATORY_TRACT
  Filled 2021-04-05 (×5): qty 3

## 2021-04-05 NOTE — ED Notes (Signed)
Pt resting comfortably at this time. NAD noted. Call bell in reach.  

## 2021-04-05 NOTE — ED Notes (Signed)
Informed RN bed assigned 

## 2021-04-05 NOTE — Progress Notes (Signed)
PROGRESS NOTE    Craig Vazquez   PVX:[id number]  DOB: 04/26/1931  PCP: Wenda Low, MD    DOA: 04/04/2021 LOS: 1    Brief Narrative / Hospital Course to Date:   87 year old male with PMHx of  borderline diabetes mellitus, asthma not on oxygen at home, stroke, GERD, vertigo, left inguinal hernia, CKD-3A, hard of hearing, anxiety, who presented to the ED on 04/04/21 with worsening shortness of breath x 3 days with productive cough, poor appetite and generalized weakness.  Evaluation in the ED consistent with pneumonia with hypoxia, leukocytosis and chest x-ray showing bilateral basilar infiltrates. Admitted for further management of Community-acquired pneumonia and started on IV antibiotics.   Assessment & Plan   Principal Problem:   Acute respiratory failure with hypoxia (HCC) Active Problems:   CAP (community acquired pneumonia)   Asthma exacerbation   Sepsis ([place])   CKD (chronic kidney disease), stage IIIa   Stroke (HCC)   GERD (gastroesophageal reflux disease)   Hyponatremia   Acute respiratory failure with hypoxia and sepsis due to Community-acquired Pneumonia and asthma exacerbation-patient met sepsis criteria on admission with leukocytosis, tachycardia, tachypnea in the setting of pneumonia based on chest x-ray with bibasilar infiltrates and presenting with worsening shortness of breath and cough.   --Continue empiric Rocephin and Zithromax --Continue Solu-Medrol --Supportive care with bronchodilators, Mucinex --Supplemental oxygen to maintain sats above 90%, wean as tolerated --Follow blood and sputum cultures --Follow-up pending echo    Patient had elevated BNP but no other signs of volume overload or CHF decompensation on admission.  Monitor volume status.  Hyponatremia -presented with sodium of 129, likely due to poor p.o. intake and dehydration.  Sodium normalized after IV hydration, 139 today --DC salt tablets started on admission --Off IV fluids --Monitor  BMP --Encourage p.o. intake and hydration  CKD stage IIIa -renal function near baseline, presented with creatinine 1.60, today 1.42.  Baseline creatinine appears 1.4-1.5. --Monitor BMP  History of stroke -continue aspirin  GERD -continue Protonix    Patient BMI: There is no height or weight on file to calculate BMI.   DVT prophylaxis: enoxaparin (LOVENOX) injection 40 mg Start: 04/04/21 2200   Diet:  Diet Orders (From admission, onward)     Start     Ordered   04/04/21 1612  Diet Carb Modified Fluid consistency: Thin; Room service appropriate? Yes  Diet effective now       Question Answer Comment  Calorie Level Medium 1600-2000   Fluid consistency: Thin   Room service appropriate? Yes   Na restriction, if any: 2 gm Na      04/04/21 1611              Code Status: DNR   Subjective 04/05/21    Patient awake sitting up in bed when seen today.  He reports feeling much better today.  He has been weaned off of oxygen.  Asking about when he will be able to go home.  He is agreeable to additional IV steroid and antibiotic but hopes to be able to return home tomorrow.  Denies fevers or chills, still with cough but improved.  No chest pain or shortness of breath at rest.   Disposition Plan & Communication   Status is: Inpatient  Remains inpatient appropriate because:IV treatments appropriate due to intensity of illness or inability to take PO  Dispo: The patient is from: Home              Anticipated d/c is  to: Home              Patient currently is not medically stable to d/c.   Difficult to place patient No   Consults, Procedures, Significant Events   Consultants:  None  Procedures:  None  Antimicrobials:  Anti-infectives (From admission, onward)    Start     Dose/Rate Route Frequency Ordered Stop   04/05/21 1500  cefTRIAXone (ROCEPHIN) 2 g in sodium chloride 0.9 % 100 mL IVPB        2 g 200 mL/hr over 30 Minutes Intravenous Every 24 hours 04/04/21 1614      04/05/21 1500  azithromycin (ZITHROMAX) 500 mg in sodium chloride 0.9 % 250 mL IVPB        500 mg 250 mL/hr over 60 Minutes Intravenous Every 24 hours 04/04/21 1614     04/04/21 1530  cefTRIAXone (ROCEPHIN) 2 g in sodium chloride 0.9 % 100 mL IVPB        2 g 200 mL/hr over 30 Minutes Intravenous  Once 04/04/21 1528 04/04/21 1725   04/04/21 1530  azithromycin (ZITHROMAX) 500 mg in sodium chloride 0.9 % 250 mL IVPB        500 mg 250 mL/hr over 60 Minutes Intravenous  Once 04/04/21 1528 04/04/21 1725         Micro    Objective   Vitals:   04/05/21 0630 04/05/21 0700 04/05/21 0929 04/05/21 1111  BP: (!) 145/66 116/66 139/79 (!) 128/58  Pulse: 90 86  98  Resp: 15 16 20 17   Temp:  98 F (36.7 C) 98 F (36.7 C) 98.7 F (37.1 C)  TempSrc:  Oral  Oral  SpO2: 100% 95%  97%    Intake/Output Summary (Last 24 hours) at 04/05/2021 1632 Last data filed at 04/05/2021 1526 Gross per 24 hour  Intake 521.32 ml  Output --  Net 521.32 ml   There were no vitals filed for this visit.  Physical Exam:  General exam: awake, alert, no acute distress HEENT: atraumatic, clear conjunctiva, anicteric sclera, moist mucus membranes, hearing grossly normal  Respiratory system: Lungs clear but with poor air movement, no wheezes heard, no rhonchi, normal respiratory effort, on room air. Cardiovascular system: normal S1/S2, RRR, no JVD, murmurs, rubs, gallops, no pedal edema.   Gastrointestinal system: soft, NT, ND, no HSM felt, +bowel sounds. Central nervous system: A&O x3. no gross focal neurologic deficits, normal speech Extremities: moves all, no edema, normal tone Skin: dry, intact, normal temperature Psychiatry: normal mood, congruent affect, judgement and insight appear normal  Labs   Data Reviewed: I have personally reviewed following labs and imaging studies  CBC: Recent Labs  Lab 04/04/21 1319 04/05/21 0613  WBC 17.0* 11.4*  NEUTROABS 15.9*  --   HGB 9.5* 8.8*  HCT 28.2* 27.4*   MCV 83.9 84.8  PLT 238 656   Basic Metabolic Panel: Recent Labs  Lab 04/04/21 1319 04/05/21 0613  NA 129* 139  K 3.7 3.9  CL 96* 104  CO2 25 24  GLUCOSE 107* 158*  BUN 33* 32*  CREATININE 1.60* 1.42*  CALCIUM 8.5* 8.7*   GFR: CrCl cannot be calculated (Unknown ideal weight.). Liver Function Tests: Recent Labs  Lab 04/04/21 1319  AST 33  ALT 15  ALKPHOS 43  BILITOT 0.9  PROT 7.3  ALBUMIN 3.8   No results for input(s): LIPASE, AMYLASE in the last 168 hours. No results for input(s): AMMONIA in the last 168 hours. Coagulation Profile: No results for  input(s): INR, PROTIME in the last 168 hours. Cardiac Enzymes: No results for input(s): CKTOTAL, CKMB, CKMBINDEX, TROPONINI in the last 168 hours. BNP (last 3 results) No results for input(s): PROBNP in the last 8760 hours. HbA1C: No results for input(s): HGBA1C in the last 72 hours. CBG: No results for input(s): GLUCAP in the last 168 hours. Lipid Profile: No results for input(s): CHOL, HDL, LDLCALC, TRIG, CHOLHDL, LDLDIRECT in the last 72 hours. Thyroid Function Tests: No results for input(s): TSH, T4TOTAL, FREET4, T3FREE, THYROIDAB in the last 72 hours. Anemia Panel: No results for input(s): VITAMINB12, FOLATE, FERRITIN, TIBC, IRON, RETICCTPCT in the last 72 hours. Sepsis Labs: Recent Labs  Lab 04/04/21 1544  PROCALCITON 5.87  LATICACIDVEN 1.7    Recent Results (from the past 240 hour(s))  Blood culture (routine x 2)     Status: None (Preliminary result)   Collection Time: 04/04/21  3:44 PM   Specimen: BLOOD  Result Value Ref Range Status   Specimen Description BLOOD BLOOD LEFT FOREARM  Final   Special Requests   Final    BOTTLES DRAWN AEROBIC AND ANAEROBIC Blood Culture results may not be optimal due to an excessive volume of blood received in culture bottles   Culture   Final    NO GROWTH < 24 HOURS Performed at [facility], [address] [place], [place] [address]    Report Status  PENDING  Incomplete  Blood culture (routine x 2)     Status: None (Preliminary result)   Collection Time: 04/04/21  3:44 PM   Specimen: BLOOD  Result Value Ref Range Status   Specimen Description BLOOD RIGHT ANTECUBITAL  Final   Special Requests   Final    BOTTLES DRAWN AEROBIC AND ANAEROBIC Blood Culture results may not be optimal due to an inadequate volume of blood received in culture bottles   Culture   Final    NO GROWTH < 24 HOURS Performed at [facility], [address], [place], [place] [address]    Report Status PENDING  Incomplete  Resp Panel by RT-PCR (Flu A&B, Covid) Nasopharyngeal Swab     Status: None   Collection Time: 04/04/21  3:44 PM   Specimen: Nasopharyngeal Swab; Nasopharyngeal(NP) swabs in vial transport medium  Result Value Ref Range Status   SARS Coronavirus 2 by RT PCR NEGATIVE NEGATIVE Final    Comment: (NOTE) SARS-CoV-2 target nucleic acids are NOT DETECTED.  The SARS-CoV-2 RNA is generally detectable in upper respiratory specimens during the acute phase of infection. The lowest concentration of SARS-CoV-2 viral copies this assay can detect is 138 copies/mL. A negative result does not preclude SARS-Cov-2 infection and should not be used as the sole basis for treatment or other patient management decisions. A negative result may occur with  improper specimen collection/handling, submission of specimen other than nasopharyngeal swab, presence of viral mutation(s) within the areas targeted by this assay, and inadequate number of viral copies(<138 copies/mL). A negative result must be combined with clinical observations, patient history, and epidemiological information. The expected result is Negative.  Fact Sheet for Patients:  EntrepreneurPulse.com.au  Fact Sheet for Healthcare Providers:  IncredibleEmployment.be  This test is no t yet approved or cleared by the [place] FDA and  has been authorized  for detection and/or diagnosis of SARS-CoV-2 by FDA under an Emergency Use Authorization (EUA). This EUA will remain  in effect (meaning this test can be used) for the duration of the COVID-19 declaration under Section 564(b)(1) of the  Act, 21 U.S.C.section 360bbb-3(b)(1), unless the authorization is terminated  or revoked sooner.       Influenza A by PCR NEGATIVE NEGATIVE Final   Influenza B by PCR NEGATIVE NEGATIVE Final    Comment: (NOTE) The Xpert Xpress SARS-CoV-2/FLU/RSV plus assay is intended as an aid in the diagnosis of influenza from Nasopharyngeal swab specimens and should not be used as a sole basis for treatment. Nasal washings and aspirates are unacceptable for Xpert Xpress SARS-CoV-2/FLU/RSV testing.  Fact Sheet for Patients: EntrepreneurPulse.com.au  Fact Sheet for Healthcare Providers: IncredibleEmployment.be  This test is not yet approved or cleared by the [place] FDA and has been authorized for detection and/or diagnosis of SARS-CoV-2 by FDA under an Emergency Use Authorization (EUA). This EUA will remain in effect (meaning this test can be used) for the duration of the COVID-19 declaration under Section 564(b)(1) of the Act, 21 U.S.C. section 360bbb-3(b)(1), unless the authorization is terminated or revoked.  Performed at [facility], [place], [place] [address]       Imaging Studies   DG Chest 2 View  Result Date: 04/04/2021 CLINICAL DATA:  Hypoxia fatigue and decreased appetite in an 87 year old male. EXAM: CHEST - 2 VIEW COMPARISON:  May 15, 2020. FINDINGS: Trachea is midline. Cardiomediastinal contours and hilar structures are unremarkable and unchanged. Partially obscured LEFT heart border and LEFT hemidiaphragm with ill-defined airspace disease in the LEFT and RIGHT lung base. Small bilateral pleural effusions. On limited assessment there is no acute skeletal process. IMPRESSION:  Findings suspicious for bibasilar pneumonia or aspiration related changes. Small effusions. Electronically Signed   By: Zetta Bills M.D.   On: 04/04/2021 14:21   ECHOCARDIOGRAM COMPLETE  Result Date: 04/05/2021    ECHOCARDIOGRAM REPORT   Patient Name:   Craig Vazquez Date of Exam: 04/05/2021 Medical Rec #:  [id number]      Height:       70.0 in Accession #:    [id number]     Weight:       145.0 lb Date of Birth:  1931/06/05      BSA:          1.821 m Patient Age:    48 years       BP:           145/66 mmHg Patient Gender: M              HR:           90 bpm. Exam Location:  [facility] Procedure: 2D Echo, Cardiac Doppler and Color Doppler Indications:     CHF- acute diastolic O13.08  History:         Patient has no prior history of Echocardiogram examinations.                  Stroke; Risk Factors:Diabetes.  Sonographer:     Sherrie Sport Referring Phys:  [id number] Soledad Gerlach NIU Diagnosing Phys: Nelva Bush MD  Sonographer Comments: Suboptimal apical window. Image acquisition challenging due to patient body habitus. IMPRESSIONS  1. Left ventricular ejection fraction, by estimation, is 60 to 65%. The left ventricle has normal function. Left ventricular endocardial border not optimally defined to evaluate regional wall motion. There is mild left ventricular hypertrophy. Left ventricular diastolic function could not be evaluated.  2. Pulmonary artery pressure is at least mildly elevated (RVSP 30-35 mmHg plus central venous pressure). Right ventricular systolic function is normal. The right ventricular size is moderately enlarged.  3. Right atrial  size was mildly dilated.  4. The mitral valve is grossly normal. No evidence of mitral valve regurgitation.  5. The aortic valve has an indeterminant number of cusps. There is mild thickening of the aortic valve. Aortic valve regurgitation is not visualized. Mild aortic valve sclerosis is present, with no evidence of aortic valve stenosis.  6. Aortic dilatation noted. There is mild  dilatation of the aortic root, measuring 40 mm. FINDINGS  Left Ventricle: Left ventricular ejection fraction, by estimation, is 60 to 65%. The left ventricle has normal function. Left ventricular endocardial border not optimally defined to evaluate regional wall motion. The left ventricular internal cavity size was normal in size. There is mild left ventricular hypertrophy. Left ventricular diastolic function could not be evaluated due to atrial fibrillation. Left ventricular diastolic function could not be evaluated. Right Ventricle: Pulmonary artery pressure is at least mildly elevated (RVSP 30-35 mmHg plus central venous pressure). The right ventricular size is moderately enlarged. No increase in right ventricular wall thickness. Right ventricular systolic function  is normal. Left Atrium: Left atrial size was normal in size. Right Atrium: Right atrial size was mildly dilated. Pericardium: The pericardium was not well visualized. Mitral Valve: The mitral valve is grossly normal. Mild to moderate mitral annular calcification. No evidence of mitral valve regurgitation. Tricuspid Valve: The tricuspid valve is normal in structure. Tricuspid valve regurgitation is trivial. Aortic Valve: The aortic valve has an indeterminant number of cusps. There is mild thickening of the aortic valve. Aortic valve regurgitation is not visualized. Mild aortic valve sclerosis is present, with no evidence of aortic valve stenosis. Aortic valve mean gradient measures 2.0 mmHg. Aortic valve peak gradient measures 3.6 mmHg. Aortic valve area, by VTI measures 2.80 cm. Pulmonic Valve: The pulmonic valve was not well visualized. Pulmonic valve regurgitation is not visualized. No evidence of pulmonic stenosis. Aorta: Aortic dilatation noted. There is mild dilatation of the aortic root, measuring 40 mm. Pulmonary Artery: The pulmonary artery is not well seen. Venous: The inferior vena cava was not well visualized. IAS/Shunts: The interatrial  septum was not well visualized.  LEFT VENTRICLE PLAX 2D LVIDd:         4.20 cm LVIDs:         2.90 cm LV PW:         1.10 cm LV IVS:        1.00 cm LVOT diam:     2.00 cm LV SV:         56 LV SV Index:   31 LVOT Area:     3.14 cm  RIGHT VENTRICLE RV Basal diam:  3.70 cm RV S prime:     12.70 cm/s TAPSE (M-mode): 4.7 cm LEFT ATRIUM             Index       RIGHT ATRIUM           Index LA diam:        3.40 cm 1.87 cm/m  RA Area:     19.90 cm LA Vol (A2C):   67.5 ml 37.07 ml/m RA Volume:   61.80 ml  33.94 ml/m LA Vol (A4C):   26.2 ml 14.39 ml/m LA Biplane Vol: 42.0 ml 23.07 ml/m  AORTIC VALVE                   PULMONIC VALVE AV Area (Vmax):    2.62 cm    PV Vmax:        1.13 m/s AV  Area (Vmean):   2.49 cm    PV Peak grad:   5.1 mmHg AV Area (VTI):     2.80 cm    RVOT Peak grad: 4 mmHg AV Vmax:           95.40 cm/s AV Vmean:          63.300 cm/s AV VTI:            0.200 m AV Peak Grad:      3.6 mmHg AV Mean Grad:      2.0 mmHg LVOT Vmax:         79.60 cm/s LVOT Vmean:        50.200 cm/s LVOT VTI:          0.178 m LVOT/AV VTI ratio: 0.89  AORTA Ao Root diam: 3.83 cm MITRAL VALVE                TRICUSPID VALVE MV Area (PHT): 10.84 cm    TR Peak grad:   33.4 mmHg MV Decel Time: 70 msec      TR Vmax:        289.00 cm/s MV E velocity: 108.00 cm/s                             SHUNTS                             Systemic VTI:  0.18 m                             Systemic Diam: 2.00 cm Nelva Bush MD Electronically signed by Nelva Bush MD Signature Date/Time: 04/05/2021/12:30:29 PM    Final      Medications   Scheduled Meds:  aspirin  325 mg Oral Daily   enoxaparin (LOVENOX) injection  40 mg Subcutaneous Q24H   ipratropium-albuterol  3 mL Nebulization Q6H   methylPREDNISolone (SOLU-MEDROL) injection  40 mg Intravenous Q12H   pantoprazole  40 mg Oral QAC breakfast   Continuous Infusions:  azithromycin (ZITHROMAX) 500 MG IVPB (Vial-Mate [employer]) 500 mg (04/05/21 1525)   cefTRIAXone (ROCEPHIN)  IV  Stopped (04/05/21 1514)       LOS: 1 day    Time spent: 30 minutes    Ezekiel Slocumb, DO [facility]  04/05/2021, 4:32 PM      If 7PM-7AM, please contact night-coverage. How to contact the [facility] Attending or Consulting provider [place] or covering provider during after hours [place], for this patient?    Check the care team in [facility] and look for a) attending/consulting [facility] provider listed and b) the [facility] team listed Log into [email or web address] and use [place]'s universal password to access. If you do not have the password, please contact the hospital operator. Locate the [facility] provider you are looking for under [facility] and page to a number that you can be directly reached. If you still have difficulty reaching the provider, please page the [facility] (Director on Call) for the Hospitalists listed on amion for assistance.

## 2021-04-05 NOTE — ED Notes (Signed)
Transport here for pt, echo at bedside and requests transport comes back in 15 minutes so he can finish echo.

## 2021-04-05 NOTE — Progress Notes (Signed)
*  PRELIMINARY RESULTS* Echocardiogram 2D Echocardiogram has been performed.  Sherrie Sport 04/05/2021, 8:40 AM

## 2021-04-06 DIAGNOSIS — J9601 Acute respiratory failure with hypoxia: Secondary | ICD-10-CM | POA: Diagnosis not present

## 2021-04-06 LAB — CBC
HCT: 23.4 % — ABNORMAL LOW (ref 39.0–52.0)
Hemoglobin: 7.9 g/dL — ABNORMAL LOW (ref 13.0–17.0)
MCH: 28.4 pg (ref 26.0–34.0)
MCHC: 33.8 g/dL (ref 30.0–36.0)
MCV: 84.2 fL (ref 80.0–100.0)
Platelets: 215 10*3/uL (ref 150–400)
RBC: 2.78 MIL/uL — ABNORMAL LOW (ref 4.22–5.81)
RDW: 15.4 % (ref 11.5–15.5)
WBC: 14.5 10*3/uL — ABNORMAL HIGH (ref 4.0–10.5)
nRBC: 0 % (ref 0.0–0.2)

## 2021-04-06 LAB — LEGIONELLA PNEUMOPHILA SEROGP 1 UR AG: L. pneumophila Serogp 1 Ur Ag: NEGATIVE

## 2021-04-06 LAB — BASIC METABOLIC PANEL
Anion gap: 5 (ref 5–15)
BUN: 35 mg/dL — ABNORMAL HIGH (ref 8–23)
CO2: 25 mmol/L (ref 22–32)
Calcium: 8.5 mg/dL — ABNORMAL LOW (ref 8.9–10.3)
Chloride: 107 mmol/L (ref 98–111)
Creatinine, Ser: 1.35 mg/dL — ABNORMAL HIGH (ref 0.61–1.24)
GFR, Estimated: 51 mL/min — ABNORMAL LOW (ref >60)
Glucose, Bld: 130 mg/dL — ABNORMAL HIGH (ref 70–99)
Potassium: 3.3 mmol/L — ABNORMAL LOW (ref 3.5–5.1)
Sodium: 137 mmol/L (ref 135–145)

## 2021-04-06 LAB — MAGNESIUM: Magnesium: 2.2 mg/dL (ref 1.7–2.4)

## 2021-04-06 LAB — GLUCOSE, CAPILLARY: Glucose-Capillary: 119 mg/dL — ABNORMAL HIGH (ref 70–99)

## 2021-04-06 MED ORDER — IPRATROPIUM-ALBUTEROL 0.5-2.5 (3) MG/3ML IN SOLN: 3.0000 mL | Freq: Two times a day (BID) | RESPIRATORY_TRACT | Status: DC

## 2021-04-06 MED ORDER — POTASSIUM CHLORIDE CRYS ER 20 MEQ PO TBCR
40.0000 meq | EXTENDED_RELEASE_TABLET | Freq: Once | ORAL | Status: AC
  Administered 2021-04-06: 40 meq via ORAL
  Filled 2021-04-06: qty 4
  Filled 2021-04-06: qty 2

## 2021-04-06 NOTE — Progress Notes (Signed)
PROGRESS NOTE    Craig Vazquez   OFB:[id number]  DOB: 23-Nov-1930  PCP: Wenda Low, MD    DOA: 04/04/2021 LOS: 2    Brief Narrative / Hospital Course to Date:   87 year old male with PMHx of  borderline diabetes mellitus, asthma not on oxygen at home, stroke, GERD, vertigo, left inguinal hernia, CKD-3A, hard of hearing, anxiety, who presented to the ED on 04/04/21 with worsening shortness of breath x 3 days with productive cough, poor appetite and generalized weakness.  Evaluation in the ED consistent with pneumonia with hypoxia, leukocytosis and chest x-ray showing bilateral basilar infiltrates. Admitted for further management of Community-acquired pneumonia and started on IV antibiotics.   Assessment & Plan   Principal Problem:   Acute respiratory failure with hypoxia (HCC) Active Problems:   CAP (community acquired pneumonia)   Asthma exacerbation   Sepsis ([place])   CKD (chronic kidney disease), stage IIIa   Stroke (HCC)   GERD (gastroesophageal reflux disease)   Hyponatremia   Acute respiratory failure with hypoxia and sepsis due to Community-acquired Pneumonia  asthma exacerbation -patient met sepsis criteria on admission with leukocytosis, tachycardia, tachypnea in the setting of pneumonia based on chest x-ray with bibasilar infiltrates and presenting with worsening shortness of breath and cough.   --started on Rocephin and Zithromax and Solu-Medrol --weaned down to RA today Plan: --cont ceftriaxone and azithromycin for 5 day course --d/c IV solumedrol since no wheezing  Hyponatremia  -presented with sodium of 129, likely due to poor p.o. intake and dehydration.  Sodium normalized after IV hydration. --DC'ed salt tablets started on admission --encourage oral hydration  CKD stage IIIa  -renal function near baseline, presented with creatinine 1.60, Baseline creatinine appears 1.4-1.5.  History of stroke  --cont home ASA 325 mg daily  GERD  --cont  PPI  Patient BMI: Body mass index is 20.15 kg/m.    DVT prophylaxis: Lovenox SQ Code Status: DNR  Family Communication:  Status is: inpatient Dispo:   The patient is from: home Anticipated d/c is to: home Anticipated d/c date is: 2 days Patient currently is not medically stable to d/c due to: IV abx for PNA   Subjective 04/06/21   Pt reported feeling better.  Continued to cough up thick green sputum.     Consults, Procedures, Significant Events   Consultants:  None  Procedures:  None  Antimicrobials:  Anti-infectives (From admission, onward)    Start     Dose/Rate Route Frequency Ordered Stop   04/05/21 1500  cefTRIAXone (ROCEPHIN) 2 g in sodium chloride 0.9 % 100 mL IVPB        2 g 200 mL/hr over 30 Minutes Intravenous Every 24 hours 04/04/21 1614     04/05/21 1500  azithromycin (ZITHROMAX) 500 mg in sodium chloride 0.9 % 250 mL IVPB        500 mg 250 mL/hr over 60 Minutes Intravenous Every 24 hours 04/04/21 1614     04/04/21 1530  cefTRIAXone (ROCEPHIN) 2 g in sodium chloride 0.9 % 100 mL IVPB        2 g 200 mL/hr over 30 Minutes Intravenous  Once 04/04/21 1528 04/04/21 1725   04/04/21 1530  azithromycin (ZITHROMAX) 500 mg in sodium chloride 0.9 % 250 mL IVPB        500 mg 250 mL/hr over 60 Minutes Intravenous  Once 04/04/21 1528 04/04/21 1725         Micro    Objective   Vitals:  04/06/21 0850 04/06/21 1203 04/06/21 1306 04/06/21 1630  BP:  123/63  140/75  Pulse:  71  73  Resp:  17  18  Temp:  98.2 F (36.8 C)  98.3 F (36.8 C)  TempSrc:    Oral  SpO2:  96% 96% 100%  Weight: 63.7 kg     Height: 5' 10"  (1.778 m)      No intake or output data in the 24 hours ending 04/06/21 1948  Filed Weights   04/06/21 0850  Weight: 63.7 kg    Physical Exam:  Constitutional: NAD, AAOx3 HEENT: conjunctivae and lids normal, EOMI CV: No cyanosis.   RESP: normal respiratory effort, on RA Extremities: No effusions, edema in BLE SKIN: warm, dry Neuro:  II - XII grossly intact.   Psych: Normal mood and affect.  Appropriate judgement and reason   Labs   Data Reviewed: I have personally reviewed following labs and imaging studies  CBC: Recent Labs  Lab 04/04/21 1319 04/05/21 0613 04/06/21 0435  WBC 17.0* 11.4* 14.5*  NEUTROABS 15.9*  --   --   HGB 9.5* 8.8* 7.9*  HCT 28.2* 27.4* 23.4*  MCV 83.9 84.8 84.2  PLT 238 196 753   Basic Metabolic Panel: Recent Labs  Lab 04/04/21 1319 04/05/21 0613 04/06/21 0435  NA 129* 139 137  K 3.7 3.9 3.3*  CL 96* 104 107  CO2 25 24 25   GLUCOSE 107* 158* 130*  BUN 33* 32* 35*  CREATININE 1.60* 1.42* 1.35*  CALCIUM 8.5* 8.7* 8.5*  MG  --   --  2.2   GFR: Estimated Creatinine Clearance: 34.7 mL/min (A) (by C-G formula based on SCr of 1.35 mg/dL (H)). Liver Function Tests: Recent Labs  Lab 04/04/21 1319  AST 33  ALT 15  ALKPHOS 43  BILITOT 0.9  PROT 7.3  ALBUMIN 3.8   No results for input(s): LIPASE, AMYLASE in the last 168 hours. No results for input(s): AMMONIA in the last 168 hours. Coagulation Profile: No results for input(s): INR, PROTIME in the last 168 hours. Cardiac Enzymes: No results for input(s): CKTOTAL, CKMB, CKMBINDEX, TROPONINI in the last 168 hours. BNP (last 3 results) No results for input(s): PROBNP in the last 8760 hours. HbA1C: No results for input(s): HGBA1C in the last 72 hours. CBG: Recent Labs  Lab 04/06/21 0851  GLUCAP 119*   Lipid Profile: No results for input(s): CHOL, HDL, LDLCALC, TRIG, CHOLHDL, LDLDIRECT in the last 72 hours. Thyroid Function Tests: No results for input(s): TSH, T4TOTAL, FREET4, T3FREE, THYROIDAB in the last 72 hours. Anemia Panel: No results for input(s): VITAMINB12, FOLATE, FERRITIN, TIBC, IRON, RETICCTPCT in the last 72 hours. Sepsis Labs: Recent Labs  Lab 04/04/21 1544  PROCALCITON 5.87  LATICACIDVEN 1.7    Recent Results (from the past 240 hour(s))  Blood culture (routine x 2)     Status: None (Preliminary  result)   Collection Time: 04/04/21  3:44 PM   Specimen: BLOOD  Result Value Ref Range Status   Specimen Description BLOOD BLOOD LEFT FOREARM  Final   Special Requests   Final    BOTTLES DRAWN AEROBIC AND ANAEROBIC Blood Culture results may not be optimal due to an excessive volume of blood received in culture bottles   Culture   Final    NO GROWTH 2 DAYS Performed at [facility], [address] [place],  [address]    Report Status PENDING  Incomplete  Blood culture (routine x 2)     Status:  None (Preliminary result)   Collection Time: 04/04/21  3:44 PM   Specimen: BLOOD  Result Value Ref Range Status   Specimen Description BLOOD RIGHT ANTECUBITAL  Final   Special Requests   Final    BOTTLES DRAWN AEROBIC AND ANAEROBIC Blood Culture results may not be optimal due to an inadequate volume of blood received in culture bottles   Culture   Final    NO GROWTH 2 DAYS Performed at [facility], [address] [place], [place] [address]    Report Status PENDING  Incomplete  Resp Panel by RT-PCR (Flu A&B, Covid) Nasopharyngeal Swab     Status: None   Collection Time: 04/04/21  3:44 PM   Specimen: Nasopharyngeal Swab; Nasopharyngeal(NP) swabs in vial transport medium  Result Value Ref Range Status   SARS Coronavirus 2 by RT PCR NEGATIVE NEGATIVE Final    Comment: (NOTE) SARS-CoV-2 target nucleic acids are NOT DETECTED.  The SARS-CoV-2 RNA is generally detectable in upper respiratory specimens during the acute phase of infection. The lowest concentration of SARS-CoV-2 viral copies this assay can detect is 138 copies/mL. A negative result does not preclude SARS-Cov-2 infection and should not be used as the sole basis for treatment or other patient management decisions. A negative result may occur with  improper specimen collection/handling, submission of specimen other than nasopharyngeal swab, presence of viral mutation(s) within the areas targeted by this  assay, and inadequate number of viral copies(<138 copies/mL). A negative result must be combined with clinical observations, patient history, and epidemiological information. The expected result is Negative.  Fact Sheet for Patients:  EntrepreneurPulse.com.au  Fact Sheet for Healthcare Providers:  IncredibleEmployment.be  This test is no t yet approved or cleared by the [place] FDA and  has been authorized for detection and/or diagnosis of SARS-CoV-2 by FDA under an Emergency Use Authorization (EUA). This EUA will remain  in effect (meaning this test can be used) for the duration of the COVID-19 declaration under Section 564(b)(1) of the Act, 21 U.S.C.section 360bbb-3(b)(1), unless the authorization is terminated  or revoked sooner.       Influenza A by PCR NEGATIVE NEGATIVE Final   Influenza B by PCR NEGATIVE NEGATIVE Final    Comment: (NOTE) The Xpert Xpress SARS-CoV-2/FLU/RSV plus assay is intended as an aid in the diagnosis of influenza from Nasopharyngeal swab specimens and should not be used as a sole basis for treatment. Nasal washings and aspirates are unacceptable for Xpert Xpress SARS-CoV-2/FLU/RSV testing.  Fact Sheet for Patients: EntrepreneurPulse.com.au  Fact Sheet for Healthcare Providers: IncredibleEmployment.be  This test is not yet approved or cleared by the [place] FDA and has been authorized for detection and/or diagnosis of SARS-CoV-2 by FDA under an Emergency Use Authorization (EUA). This EUA will remain in effect (meaning this test can be used) for the duration of the COVID-19 declaration under Section 564(b)(1) of the Act, 21 U.S.C. section 360bbb-3(b)(1), unless the authorization is terminated or revoked.  Performed at [facility], [address] [place],  [address]       Imaging Studies   ECHOCARDIOGRAM COMPLETE  Result Date: 04/05/2021     ECHOCARDIOGRAM REPORT   Patient Name:   SEDRICK TOBER Date of Exam: 04/05/2021 Medical Rec #:  [id number]      Height:       70.0 in Accession #:    [id number]     Weight:       145.0 lb Date of Birth:  May 13, 1931  BSA:          1.821 m Patient Age:    70 years       BP:           145/66 mmHg Patient Gender: M              HR:           90 bpm. Exam Location:  [facility] Procedure: 2D Echo, Cardiac Doppler and Color Doppler Indications:     CHF- acute diastolic L07.86  History:         Patient has no prior history of Echocardiogram examinations.                  Stroke; Risk Factors:Diabetes.  Sonographer:     Sherrie Sport Referring Phys:  [id number] Soledad Gerlach NIU Diagnosing Phys: Nelva Bush MD  Sonographer Comments: Suboptimal apical window. Image acquisition challenging due to patient body habitus. IMPRESSIONS  1. Left ventricular ejection fraction, by estimation, is 60 to 65%. The left ventricle has normal function. Left ventricular endocardial border not optimally defined to evaluate regional wall motion. There is mild left ventricular hypertrophy. Left ventricular diastolic function could not be evaluated.  2. Pulmonary artery pressure is at least mildly elevated (RVSP 30-35 mmHg plus central venous pressure). Right ventricular systolic function is normal. The right ventricular size is moderately enlarged.  3. Right atrial size was mildly dilated.  4. The mitral valve is grossly normal. No evidence of mitral valve regurgitation.  5. The aortic valve has an indeterminant number of cusps. There is mild thickening of the aortic valve. Aortic valve regurgitation is not visualized. Mild aortic valve sclerosis is present, with no evidence of aortic valve stenosis.  6. Aortic dilatation noted. There is mild dilatation of the aortic root, measuring 40 mm. FINDINGS  Left Ventricle: Left ventricular ejection fraction, by estimation, is 60 to 65%. The left ventricle has normal function. Left ventricular endocardial border not  optimally defined to evaluate regional wall motion. The left ventricular internal cavity size was normal in size. There is mild left ventricular hypertrophy. Left ventricular diastolic function could not be evaluated due to atrial fibrillation. Left ventricular diastolic function could not be evaluated. Right Ventricle: Pulmonary artery pressure is at least mildly elevated (RVSP 30-35 mmHg plus central venous pressure). The right ventricular size is moderately enlarged. No increase in right ventricular wall thickness. Right ventricular systolic function  is normal. Left Atrium: Left atrial size was normal in size. Right Atrium: Right atrial size was mildly dilated. Pericardium: The pericardium was not well visualized. Mitral Valve: The mitral valve is grossly normal. Mild to moderate mitral annular calcification. No evidence of mitral valve regurgitation. Tricuspid Valve: The tricuspid valve is normal in structure. Tricuspid valve regurgitation is trivial. Aortic Valve: The aortic valve has an indeterminant number of cusps. There is mild thickening of the aortic valve. Aortic valve regurgitation is not visualized. Mild aortic valve sclerosis is present, with no evidence of aortic valve stenosis. Aortic valve mean gradient measures 2.0 mmHg. Aortic valve peak gradient measures 3.6 mmHg. Aortic valve area, by VTI measures 2.80 cm. Pulmonic Valve: The pulmonic valve was not well visualized. Pulmonic valve regurgitation is not visualized. No evidence of pulmonic stenosis. Aorta: Aortic dilatation noted. There is mild dilatation of the aortic root, measuring 40 mm. Pulmonary Artery: The pulmonary artery is not well seen. Venous: The inferior vena cava was not well visualized. IAS/Shunts: The interatrial septum was not well visualized.  LEFT VENTRICLE PLAX  2D LVIDd:         4.20 cm LVIDs:         2.90 cm LV PW:         1.10 cm LV IVS:        1.00 cm LVOT diam:     2.00 cm LV SV:         56 LV SV Index:   31 LVOT Area:      3.14 cm  RIGHT VENTRICLE RV Basal diam:  3.70 cm RV S prime:     12.70 cm/s TAPSE (M-mode): 4.7 cm LEFT ATRIUM             Index       RIGHT ATRIUM           Index LA diam:        3.40 cm 1.87 cm/m  RA Area:     19.90 cm LA Vol (A2C):   67.5 ml 37.07 ml/m RA Volume:   61.80 ml  33.94 ml/m LA Vol (A4C):   26.2 ml 14.39 ml/m LA Biplane Vol: 42.0 ml 23.07 ml/m  AORTIC VALVE                   PULMONIC VALVE AV Area (Vmax):    2.62 cm    PV Vmax:        1.13 m/s AV Area (Vmean):   2.49 cm    PV Peak grad:   5.1 mmHg AV Area (VTI):     2.80 cm    RVOT Peak grad: 4 mmHg AV Vmax:           95.40 cm/s AV Vmean:          63.300 cm/s AV VTI:            0.200 m AV Peak Grad:      3.6 mmHg AV Mean Grad:      2.0 mmHg LVOT Vmax:         79.60 cm/s LVOT Vmean:        50.200 cm/s LVOT VTI:          0.178 m LVOT/AV VTI ratio: 0.89  AORTA Ao Root diam: 3.83 cm MITRAL VALVE                TRICUSPID VALVE MV Area (PHT): 10.84 cm    TR Peak grad:   33.4 mmHg MV Decel Time: 70 msec      TR Vmax:        289.00 cm/s MV E velocity: 108.00 cm/s                             SHUNTS                             Systemic VTI:  0.18 m                             Systemic Diam: 2.00 cm Nelva Bush MD Electronically signed by Nelva Bush MD Signature Date/Time: 04/05/2021/12:30:29 PM    Final      Medications   Scheduled Meds:  aspirin  325 mg Oral Daily   enoxaparin (LOVENOX) injection  40 mg Subcutaneous Q24H   ipratropium-albuterol  3 mL Nebulization Q6H   pantoprazole  40 mg Oral QAC breakfast   Continuous Infusions:  azithromycin (ZITHROMAX) 500 MG IVPB (Vial-Mate [employer]) 500 mg (04/06/21 1625)   cefTRIAXone (ROCEPHIN)  IV 2 g (04/06/21 1800)       LOS: 2 days    Enzo Bi, md [facility]  04/06/2021, 7:48 PM

## 2021-04-06 NOTE — Progress Notes (Signed)
Mobility Specialist - Progress Note   04/06/21 1400  Mobility  Activity Ambulated to bathroom  Range of Motion/Exercises Active  Level of Assistance Standby assist, set-up cues, supervision of patient - no hands on  Assistive Device None  Distance Ambulated (ft) 7 ft  Mobility Ambulated with assistance in room  Mobility Response Tolerated well  Mobility performed by Mobility specialist  $Mobility charge 1 Mobility    Pt utilizing RA, ambulated to bathroom for seated bathing tasks at sink with supervision. Furniture walker, but does progress to supervision with ambulation. Pt returned supine with alarm set. No complaints.    Kathee Delton Mobility Specialist 04/06/21, 2:58 PM

## 2021-04-07 DIAGNOSIS — J9601 Acute respiratory failure with hypoxia: Secondary | ICD-10-CM | POA: Diagnosis not present

## 2021-04-07 LAB — CBC
HCT: 27.5 % — ABNORMAL LOW (ref 39.0–52.0)
Hemoglobin: 9 g/dL — ABNORMAL LOW (ref 13.0–17.0)
MCH: 27.4 pg (ref 26.0–34.0)
MCHC: 32.7 g/dL (ref 30.0–36.0)
MCV: 83.8 fL (ref 80.0–100.0)
Platelets: 288 10*3/uL (ref 150–400)
RBC: 3.28 MIL/uL — ABNORMAL LOW (ref 4.22–5.81)
RDW: 15.4 % (ref 11.5–15.5)
WBC: 12.1 10*3/uL — ABNORMAL HIGH (ref 4.0–10.5)
nRBC: 0 % (ref 0.0–0.2)

## 2021-04-07 LAB — BASIC METABOLIC PANEL
Anion gap: 8 (ref 5–15)
BUN: 29 mg/dL — ABNORMAL HIGH (ref 8–23)
CO2: 28 mmol/L (ref 22–32)
Calcium: 9 mg/dL (ref 8.9–10.3)
Chloride: 103 mmol/L (ref 98–111)
Creatinine, Ser: 1.36 mg/dL — ABNORMAL HIGH (ref 0.61–1.24)
GFR, Estimated: 50 mL/min — ABNORMAL LOW (ref >60)
Glucose, Bld: 97 mg/dL (ref 70–99)
Potassium: 4 mmol/L (ref 3.5–5.1)
Sodium: 139 mmol/L (ref 135–145)

## 2021-04-07 LAB — MAGNESIUM: Magnesium: 2.3 mg/dL (ref 1.7–2.4)

## 2021-04-07 LAB — GLUCOSE, CAPILLARY: Glucose-Capillary: 103 mg/dL — ABNORMAL HIGH (ref 70–99)

## 2021-04-07 MED ORDER — GUAIFENESIN ER 600 MG PO TB12
600.0000 mg | ORAL_TABLET | Freq: Two times a day (BID) | ORAL | Status: DC
  Administered 2021-04-07 – 2021-04-08 (×3): 600 mg via ORAL
  Filled 2021-04-07 (×3): qty 1

## 2021-04-07 MED ORDER — POLYETHYLENE GLYCOL 3350 17 G PO PACK
34.0000 g | PACK | Freq: Two times a day (BID) | ORAL | Status: DC
  Administered 2021-04-07 (×2): 34 g via ORAL
  Filled 2021-04-07 (×2): qty 2

## 2021-04-07 NOTE — Progress Notes (Signed)
PROGRESS NOTE    Craig Vazquez   QMG:[id number]  DOB: [birth year]  PCP: Wenda Low, MD    DOA: 04/04/2021 LOS: 3    Brief Narrative / Hospital Course to Date:   87 year old male with PMHx of  borderline diabetes mellitus, asthma not on oxygen at home, stroke, GERD, vertigo, left inguinal hernia, CKD-3A, hard of hearing, anxiety, who presented to the ED on 04/04/21 with worsening shortness of breath x 3 days with productive cough, poor appetite and generalized weakness.  Evaluation in the ED consistent with pneumonia with hypoxia, leukocytosis and chest x-ray showing bilateral basilar infiltrates. Admitted for further management of Community-acquired pneumonia and started on IV antibiotics.   Assessment & Plan   Principal Problem:   Acute respiratory failure with hypoxia (HCC) Active Problems:   CAP (community acquired pneumonia)   Asthma exacerbation   Sepsis ([place])   CKD (chronic kidney disease), stage IIIa   Stroke (HCC)   GERD (gastroesophageal reflux disease)   Hyponatremia   Acute respiratory failure with hypoxia and sepsis due to Community-acquired Pneumonia  asthma exacerbation, ruled out -patient met sepsis criteria on admission with leukocytosis, tachycardia, tachypnea in the setting of pneumonia based on chest x-ray with bibasilar infiltrates and presenting with worsening shortness of breath and cough.   --started on Rocephin and Zithromax and Solu-Medrol --weaned down to RA  --solumedrol d/c'ed since no wheezing Plan: --cont ceftriaxone and azithromycin, day 4 of 5  Hyponatremia  -presented with sodium of 129, likely due to poor p.o. intake and dehydration.  Sodium normalized after IV hydration. --DC'ed salt tablets started on admission --encourage oral hydration  CKD stage IIIa  -renal function near baseline, presented with creatinine 1.60, Baseline creatinine appears 1.4-1.5.  History of stroke  --cont home ASA 325 mg daily  GERD  --cont  PPI  Patient BMI: Body mass index is 20.15 kg/m.    DVT prophylaxis: Lovenox SQ Code Status: DNR  Family Communication:  Status is: inpatient Dispo:   The patient is from: home Anticipated d/c is to: home Anticipated d/c date is: tomorrow Patient currently is not medically stable to d/c due to: IV abx for PNA   Subjective 04/07/21   Pt reported starting to cough up big chunks of phlegm.  Walking with mobility tech.   Consults, Procedures, Significant Events   Consultants:  None  Procedures:  None  Antimicrobials:  Anti-infectives (From admission, onward)    Start     Dose/Rate Route Frequency Ordered Stop   04/05/21 1500  cefTRIAXone (ROCEPHIN) 2 g in sodium chloride 0.9 % 100 mL IVPB        2 g 200 mL/hr over 30 Minutes Intravenous Every 24 hours 04/04/21 1614 04/10/21 1459   04/05/21 1500  azithromycin (ZITHROMAX) 500 mg in sodium chloride 0.9 % 250 mL IVPB        500 mg 250 mL/hr over 60 Minutes Intravenous Every 24 hours 04/04/21 1614 04/10/21 1459   04/04/21 1530  cefTRIAXone (ROCEPHIN) 2 g in sodium chloride 0.9 % 100 mL IVPB        2 g 200 mL/hr over 30 Minutes Intravenous  Once 04/04/21 1528 04/04/21 1725   04/04/21 1530  azithromycin (ZITHROMAX) 500 mg in sodium chloride 0.9 % 250 mL IVPB        500 mg 250 mL/hr over 60 Minutes Intravenous  Once 04/04/21 1528 04/04/21 1725         Micro    Objective   Vitals:  04/07/21 0403 04/07/21 0819 04/07/21 1217 04/07/21 1546  BP: 136/78 138/64 (!) 120/58 (!) 157/71  Pulse: 78 71 74 70  Resp: 17 17 18 19   Temp: 98.2 F (36.8 C) 98.4 F (36.9 C) 98.3 F (36.8 C) 98.6 F (37 C)  TempSrc: Oral     SpO2: 95% 98% 96% 99%  Weight:      Height:        Intake/Output Summary (Last 24 hours) at 04/07/2021 1631 Last data filed at 04/07/2021 1330 Gross per 24 hour  Intake 952.79 ml  Output 500 ml  Net 452.79 ml    Filed Weights   04/06/21 0850  Weight: 63.7 kg    Physical Exam:  Constitutional:  NAD, AAOx3 HEENT: conjunctivae and lids normal, EOMI CV: No cyanosis.   RESP: normal respiratory effort, on RA Extremities: No effusions, edema in BLE SKIN: warm, dry Neuro: II - XII grossly intact.   Psych: Normal mood and affect.  Appropriate judgement and reason    Labs   Data Reviewed: I have personally reviewed following labs and imaging studies  CBC: Recent Labs  Lab 04/04/21 1319 04/05/21 0613 04/06/21 0435 04/07/21 0842  WBC 17.0* 11.4* 14.5* 12.1*  NEUTROABS 15.9*  --   --   --   HGB 9.5* 8.8* 7.9* 9.0*  HCT 28.2* 27.4* 23.4* 27.5*  MCV 83.9 84.8 84.2 83.8  PLT 238 196 215 161   Basic Metabolic Panel: Recent Labs  Lab 04/04/21 1319 04/05/21 0613 04/06/21 0435 04/07/21 0842  NA 129* 139 137 139  K 3.7 3.9 3.3* 4.0  CL 96* 104 107 103  CO2 25 24 25 28   GLUCOSE 107* 158* 130* 97  BUN 33* 32* 35* 29*  CREATININE 1.60* 1.42* 1.35* 1.36*  CALCIUM 8.5* 8.7* 8.5* 9.0  MG  --   --  2.2 2.3   GFR: Estimated Creatinine Clearance: 34.5 mL/min (A) (by C-G formula based on SCr of 1.36 mg/dL (H)). Liver Function Tests: Recent Labs  Lab 04/04/21 1319  AST 33  ALT 15  ALKPHOS 43  BILITOT 0.9  PROT 7.3  ALBUMIN 3.8   No results for input(s): LIPASE, AMYLASE in the last 168 hours. No results for input(s): AMMONIA in the last 168 hours. Coagulation Profile: No results for input(s): INR, PROTIME in the last 168 hours. Cardiac Enzymes: No results for input(s): CKTOTAL, CKMB, CKMBINDEX, TROPONINI in the last 168 hours. BNP (last 3 results) No results for input(s): PROBNP in the last 8760 hours. HbA1C: No results for input(s): HGBA1C in the last 72 hours. CBG: Recent Labs  Lab 04/06/21 0851 04/07/21 0820  GLUCAP 119* 103*   Lipid Profile: No results for input(s): CHOL, HDL, LDLCALC, TRIG, CHOLHDL, LDLDIRECT in the last 72 hours. Thyroid Function Tests: No results for input(s): TSH, T4TOTAL, FREET4, T3FREE, THYROIDAB in the last 72 hours. Anemia  Panel: No results for input(s): VITAMINB12, FOLATE, FERRITIN, TIBC, IRON, RETICCTPCT in the last 72 hours. Sepsis Labs: Recent Labs  Lab 04/04/21 1544  PROCALCITON 5.87  LATICACIDVEN 1.7    Recent Results (from the past 240 hour(s))  Blood culture (routine x 2)     Status: None (Preliminary result)   Collection Time: 04/04/21  3:44 PM   Specimen: BLOOD  Result Value Ref Range Status   Specimen Description BLOOD BLOOD LEFT FOREARM  Final   Special Requests   Final    BOTTLES DRAWN AEROBIC AND ANAEROBIC Blood Culture results may not be optimal due to an excessive volume  of blood received in culture bottles   Culture   Final    NO GROWTH 3 DAYS Performed at [facility], [address] [place], [place] [address]    Report Status PENDING  Incomplete  Blood culture (routine x 2)     Status: None (Preliminary result)   Collection Time: 04/04/21  3:44 PM   Specimen: BLOOD  Result Value Ref Range Status   Specimen Description BLOOD RIGHT ANTECUBITAL  Final   Special Requests   Final    BOTTLES DRAWN AEROBIC AND ANAEROBIC Blood Culture results may not be optimal due to an inadequate volume of blood received in culture bottles   Culture   Final    NO GROWTH 3 DAYS Performed at [facility], [address] [place], [place] [address]    Report Status PENDING  Incomplete  Resp Panel by RT-PCR (Flu A&B, Covid) Nasopharyngeal Swab     Status: None   Collection Time: 04/04/21  3:44 PM   Specimen: Nasopharyngeal Swab; Nasopharyngeal(NP) swabs in vial transport medium  Result Value Ref Range Status   SARS Coronavirus 2 by RT PCR NEGATIVE NEGATIVE Final    Comment: (NOTE) SARS-CoV-2 target nucleic acids are NOT DETECTED.  The SARS-CoV-2 RNA is generally detectable in upper respiratory specimens during the acute phase of infection. The lowest concentration of SARS-CoV-2 viral copies this assay can detect is 138 copies/mL. A negative result does not preclude  SARS-Cov-2 infection and should not be used as the sole basis for treatment or other patient management decisions. A negative result may occur with  improper specimen collection/handling, submission of specimen other than nasopharyngeal swab, presence of viral mutation(s) within the areas targeted by this assay, and inadequate number of viral copies(<138 copies/mL). A negative result must be combined with clinical observations, patient history, and epidemiological information. The expected result is Negative.  Fact Sheet for Patients:  EntrepreneurPulse.com.au  Fact Sheet for Healthcare Providers:  IncredibleEmployment.be  This test is no t yet approved or cleared by the [place] FDA and  has been authorized for detection and/or diagnosis of SARS-CoV-2 by FDA under an Emergency Use Authorization (EUA). This EUA will remain  in effect (meaning this test can be used) for the duration of the COVID-19 declaration under Section 564(b)(1) of the Act, 21 U.S.C.section 360bbb-3(b)(1), unless the authorization is terminated  or revoked sooner.       Influenza A by PCR NEGATIVE NEGATIVE Final   Influenza B by PCR NEGATIVE NEGATIVE Final    Comment: (NOTE) The Xpert Xpress SARS-CoV-2/FLU/RSV plus assay is intended as an aid in the diagnosis of influenza from Nasopharyngeal swab specimens and should not be used as a sole basis for treatment. Nasal washings and aspirates are unacceptable for Xpert Xpress SARS-CoV-2/FLU/RSV testing.  Fact Sheet for Patients: EntrepreneurPulse.com.au  Fact Sheet for Healthcare Providers: IncredibleEmployment.be  This test is not yet approved or cleared by the [place] FDA and has been authorized for detection and/or diagnosis of SARS-CoV-2 by FDA under an Emergency Use Authorization (EUA). This EUA will remain in effect (meaning this test can be used) for the duration of  the COVID-19 declaration under Section 564(b)(1) of the Act, 21 U.S.C. section 360bbb-3(b)(1), unless the authorization is terminated or revoked.  Performed at [facility], [address] [place], [place] [address]       Imaging Studies   No results found.   Medications   Scheduled Meds:  aspirin  325 mg Oral Daily  enoxaparin (LOVENOX) injection  40 mg Subcutaneous Q24H   guaiFENesin  600 mg Oral BID   pantoprazole  40 mg Oral QAC breakfast   polyethylene glycol  34 g Oral BID   Continuous Infusions:  azithromycin (ZITHROMAX) 500 MG IVPB (Vial-Mate [employer]) 500 mg (04/07/21 1555)   cefTRIAXone (ROCEPHIN)  IV 2 g (04/07/21 1453)       LOS: 3 days    Enzo Bi, md [facility]  04/07/2021, 4:31 PM

## 2021-04-07 NOTE — Progress Notes (Signed)
Called to give report to 1C Nurse. Nurse at lunch until 1700. Will call nurse back.

## 2021-04-07 NOTE — Care Management Important Message (Signed)
Important Message  Patient Details  Name: Craig Vazquez MRN: [id number] Date of Birth: 1930/09/27   Medicare Important Message Given:  Yes     Dannette Barbara 04/07/2021, 4:18 PM

## 2021-04-07 NOTE — Progress Notes (Signed)
Called report to 1C Nurse, Veda Canning RN. Pt going to room 156.

## 2021-04-07 NOTE — Progress Notes (Addendum)
Mobility Specialist - Progress Note   04/07/21 1100  Mobility  Activity Ambulated in hall  Level of Assistance Standby assist, set-up cues, supervision of patient - no hands on  Assistive Device None  Distance Ambulated (ft) 800 ft  Mobility Ambulated with assistance in hallway  Mobility Response Tolerated well  Mobility performed by Mobility specialist  $Mobility charge 1 Mobility    Pre-mobility: 72 HR, 93% SpO2 During mobility: 117-122 HR, 92% SpO2 Post-mobility: 83 HR, 95% SpO2   Pt lying in bed utilizing RA. Assistance to don shoes. Ambulated in hallway with supervision. O2 maintained >/= 92%. 2 rest breaks initiated as HR briefly peaked at 122 bpm with RR in 40s via tele. VC to slow pace as pt does tend to stagger at increased speed. No complaints. Returned to bed with alarm set, motivated to ambulate again today. Will attempt if time permits.    Kathee Delton Mobility Specialist 04/07/21, 11:50 AM

## 2021-04-08 LAB — BASIC METABOLIC PANEL
Anion gap: 8 (ref 5–15)
BUN: 25 mg/dL — ABNORMAL HIGH (ref 8–23)
CO2: 27 mmol/L (ref 22–32)
Calcium: 8.3 mg/dL — ABNORMAL LOW (ref 8.9–10.3)
Chloride: 100 mmol/L (ref 98–111)
Creatinine, Ser: 1.32 mg/dL — ABNORMAL HIGH (ref 0.61–1.24)
GFR, Estimated: 52 mL/min — ABNORMAL LOW (ref >60)
Glucose, Bld: 98 mg/dL (ref 70–99)
Potassium: 3.8 mmol/L (ref 3.5–5.1)
Sodium: 135 mmol/L (ref 135–145)

## 2021-04-08 LAB — CBC
HCT: 25.9 % — ABNORMAL LOW (ref 39.0–52.0)
Hemoglobin: 8.7 g/dL — ABNORMAL LOW (ref 13.0–17.0)
MCH: 28 pg (ref 26.0–34.0)
MCHC: 33.6 g/dL (ref 30.0–36.0)
MCV: 83.3 fL (ref 80.0–100.0)
Platelets: 258 10*3/uL (ref 150–400)
RBC: 3.11 MIL/uL — ABNORMAL LOW (ref 4.22–5.81)
RDW: 15.1 % (ref 11.5–15.5)
WBC: 7.9 10*3/uL (ref 4.0–10.5)
nRBC: 0 % (ref 0.0–0.2)

## 2021-04-08 LAB — MAGNESIUM: Magnesium: 1.9 mg/dL (ref 1.7–2.4)

## 2021-04-08 MED ORDER — HYDROXYZINE HCL 10 MG PO TABS: 10.0000 mg | ORAL_TABLET | Freq: Two times a day (BID) | ORAL | 0 refills | Status: DC | PRN

## 2021-04-08 MED ORDER — AZITHROMYCIN 500 MG PO TABS
500.0000 mg | ORAL_TABLET | Freq: Every day | ORAL | Status: DC
  Administered 2021-04-08: 500 mg via ORAL
  Filled 2021-04-08: qty 1

## 2021-04-08 MED ORDER — INFLUENZA VAC A&B SA ADJ QUAD 0.5 ML IM PRSY: 0.5000 mL | PREFILLED_SYRINGE | Freq: Once | INTRAMUSCULAR | Status: AC

## 2021-04-08 MED ORDER — GUAIFENESIN ER 600 MG PO TB12: 600.0000 mg | ORAL_TABLET | Freq: Two times a day (BID) | ORAL | 0 refills | Status: AC

## 2021-04-08 MED ORDER — INFLUENZA VAC A&B SA ADJ QUAD 0.5 ML IM PRSY
0.5000 mL | PREFILLED_SYRINGE | INTRAMUSCULAR | Status: DC | PRN
  Filled 2021-04-08: qty 0.5

## 2021-04-08 MED ORDER — INFLUENZA VAC A&B SA ADJ QUAD 0.5 ML IM PRSY: 0.5000 mL | PREFILLED_SYRINGE | INTRAMUSCULAR | Status: DC

## 2021-04-08 MED ORDER — CEFTRIAXONE SODIUM 2 G IJ SOLR
2.0000 g | INTRAMUSCULAR | Status: DC
  Administered 2021-04-08: 2 g via INTRAVENOUS
  Filled 2021-04-08: qty 20

## 2021-04-08 NOTE — Discharge Summary (Signed)
Physician Discharge Summary   Craig Vazquez  male DOB: April 08, 1931  BTD:[id number]  PCP: Wenda Low, MD  Admit date: 04/04/2021 Discharge date: 04/08/2021  Admitted From: home Disposition:  home Home Health: Yes CODE STATUS: DNR  Discharge Instructions     Discharge instructions   Complete by: As directed    You have received 5 days of IV antibiotics for pneumonia, and breathing has improved and not needing supplemental oxygen.  You can continue to take Mucinex (over-the-counter) with a glass of water for 1 week to help loosen up the phlegm.     Dr. Enzo Bi [facility] Course:  For full details, please see H&P, progress notes, consult notes and ancillary notes.  Briefly,  Craig Vazquez is a 87 year old male with PMHx of  borderline diabetes mellitus, asthma not on oxygen at home, stroke, GERD, vertigo, left inguinal hernia, CKD-3A, anxiety, who presented to the ED on 04/04/21 with worsening shortness of breath x 3 days with productive cough, poor appetite and generalized weakness.   Evaluation in the ED consistent with pneumonia with hypoxia, leukocytosis and chest x-ray showing bilateral basilar infiltrates.  Acute respiratory failure with hypoxia and sepsis due to Community-acquired Pneumonia  asthma exacerbation, ruled out -patient met sepsis criteria on admission with leukocytosis, tachycardia, tachypnea in the setting of pneumonia based on chest x-ray with bibasilar infiltrates and presenting with worsening shortness of breath and cough.   --started on Rocephin and Zithromax and Solu-Medrol --weaned down to RA quickly. --solumedrol d/c'ed since no wheezing --finished 5 days of ceftriaxone and azithromycin.   Hyponatremia  -presented with sodium of 129, likely due to poor p.o. intake and dehydration.  Sodium normalized after IV hydration. --DC'ed salt tablets started on admission --encourage oral hydration   CKD stage IIIa  -renal function near  baseline, presented with creatinine 1.60, Baseline creatinine appears 1.4-1.5.  History of stroke  --cont home ASA 325 mg daily  GERD  --cont PPI   Patient BMI: Body mass index is 20.15 kg/m.    Discharge Diagnoses:  Principal Problem:   Acute respiratory failure with hypoxia ([place]) Active Problems:   CAP (community acquired pneumonia)   Asthma exacerbation   Sepsis ([place])   CKD (chronic kidney disease), stage IIIa   Stroke (HCC)   GERD (gastroesophageal reflux disease)   Hyponatremia   30 Day Unplanned Readmission Risk Score    Flowsheet Row ED to Hosp-Admission (Current) from 04/04/2021 in [place] (1A)  30 Day Unplanned Readmission Risk Score (%) 13.04 Filed at 04/08/2021 0801       This score is the patient's risk of an unplanned readmission within 30 days of being discharged (0 -100%). The score is based on dignosis, age, lab data, medications, orders, and past utilization.   Low:  0-14.9   Medium: 15-21.9   High: 22-29.9   Extreme: 30 and above         Discharge Instructions:  Allergies as of 04/08/2021   No Known Allergies      Medication List     STOP taking these medications    HYDROcodone-acetaminophen 5-325 MG tablet Commonly known as: NORCO/VICODIN       TAKE these medications    aspirin 325 MG tablet Take 1 tablet (325 mg total) by mouth daily.   fluticasone 50 MCG/ACT nasal spray Commonly known as: FLONASE Place 1 spray into the nose daily as needed for allergies.  Fluticasone Propionate (Inhal) 100 MCG/BLIST Aepb Inhale 1 Inhaler into the lungs 2 (two) times daily as needed (for respiratory issues.).   guaiFENesin 600 MG 12 hr tablet Commonly known as: MUCINEX Take 1 tablet (600 mg total) by mouth 2 (two) times daily for 7 days.   hydrOXYzine 10 MG tablet Commonly known as: ATARAX/VISTARIL Take 1 tablet (10 mg total) by mouth 2 (two) times daily as needed (home med). What changed:  when to  take this reasons to take this   influenza vaccine adjuvanted 0.5 ML injection Commonly known as: FLUAD Inject 0.5 mLs into the muscle once for 1 dose.   pantoprazole 40 MG tablet Commonly known as: PROTONIX Take 40 mg by mouth daily before breakfast.   triamcinolone cream 0.1 % Commonly known as: KENALOG Apply 1 application topically daily as needed (dry skin).         Follow-up Information     Wenda Low, MD Follow up in 1 week(s).   Specialty: Internal Medicine Contact information: [address], [address] [place] [address] [phone]                 No Known Allergies   The results of significant diagnostics from this hospitalization (including imaging, microbiology, ancillary and laboratory) are listed below for reference.   Consultations:   Procedures/Studies: DG Chest 2 View  Result Date: 04/04/2021 CLINICAL DATA:  Hypoxia fatigue and decreased appetite in an 87 year old male. EXAM: CHEST - 2 VIEW COMPARISON:  May 15, 2020. FINDINGS: Trachea is midline. Cardiomediastinal contours and hilar structures are unremarkable and unchanged. Partially obscured LEFT heart border and LEFT hemidiaphragm with ill-defined airspace disease in the LEFT and RIGHT lung base. Small bilateral pleural effusions. On limited assessment there is no acute skeletal process. IMPRESSION: Findings suspicious for bibasilar pneumonia or aspiration related changes. Small effusions. Electronically Signed   By: Zetta Bills M.D.   On: 04/04/2021 14:21   ECHOCARDIOGRAM COMPLETE  Result Date: 04/05/2021    ECHOCARDIOGRAM REPORT   Patient Name:   Craig Vazquez Date of Exam: 04/05/2021 Medical Rec #:  [id number]      Height:       70.0 in Accession #:    [id number]     Weight:       145.0 lb Date of Birth:  1930/09/03      BSA:          1.821 m Patient Age:    75 years       BP:           145/66 mmHg Patient Gender: M              HR:           90 bpm. Exam Location:  [facility]  Procedure: 2D Echo, Cardiac Doppler and Color Doppler Indications:     CHF- acute diastolic Y10.17  History:         Patient has no prior history of Echocardiogram examinations.                  Stroke; Risk Factors:Diabetes.  Sonographer:     Sherrie Sport Referring Phys:  [id number] Soledad Gerlach NIU Diagnosing Phys: Nelva Bush MD  Sonographer Comments: Suboptimal apical window. Image acquisition challenging due to patient body habitus. IMPRESSIONS  1. Left ventricular ejection fraction, by estimation, is 60 to 65%. The left ventricle has normal function. Left ventricular endocardial border not optimally defined to evaluate regional wall motion. There is mild left  ventricular hypertrophy. Left ventricular diastolic function could not be evaluated.  2. Pulmonary artery pressure is at least mildly elevated (RVSP 30-35 mmHg plus central venous pressure). Right ventricular systolic function is normal. The right ventricular size is moderately enlarged.  3. Right atrial size was mildly dilated.  4. The mitral valve is grossly normal. No evidence of mitral valve regurgitation.  5. The aortic valve has an indeterminant number of cusps. There is mild thickening of the aortic valve. Aortic valve regurgitation is not visualized. Mild aortic valve sclerosis is present, with no evidence of aortic valve stenosis.  6. Aortic dilatation noted. There is mild dilatation of the aortic root, measuring 40 mm. FINDINGS  Left Ventricle: Left ventricular ejection fraction, by estimation, is 60 to 65%. The left ventricle has normal function. Left ventricular endocardial border not optimally defined to evaluate regional wall motion. The left ventricular internal cavity size was normal in size. There is mild left ventricular hypertrophy. Left ventricular diastolic function could not be evaluated due to atrial fibrillation. Left ventricular diastolic function could not be evaluated. Right Ventricle: Pulmonary artery pressure is at least mildly elevated  (RVSP 30-35 mmHg plus central venous pressure). The right ventricular size is moderately enlarged. No increase in right ventricular wall thickness. Right ventricular systolic function  is normal. Left Atrium: Left atrial size was normal in size. Right Atrium: Right atrial size was mildly dilated. Pericardium: The pericardium was not well visualized. Mitral Valve: The mitral valve is grossly normal. Mild to moderate mitral annular calcification. No evidence of mitral valve regurgitation. Tricuspid Valve: The tricuspid valve is normal in structure. Tricuspid valve regurgitation is trivial. Aortic Valve: The aortic valve has an indeterminant number of cusps. There is mild thickening of the aortic valve. Aortic valve regurgitation is not visualized. Mild aortic valve sclerosis is present, with no evidence of aortic valve stenosis. Aortic valve mean gradient measures 2.0 mmHg. Aortic valve peak gradient measures 3.6 mmHg. Aortic valve area, by VTI measures 2.80 cm. Pulmonic Valve: The pulmonic valve was not well visualized. Pulmonic valve regurgitation is not visualized. No evidence of pulmonic stenosis. Aorta: Aortic dilatation noted. There is mild dilatation of the aortic root, measuring 40 mm. Pulmonary Artery: The pulmonary artery is not well seen. Venous: The inferior vena cava was not well visualized. IAS/Shunts: The interatrial septum was not well visualized.  LEFT VENTRICLE PLAX 2D LVIDd:         4.20 cm LVIDs:         2.90 cm LV PW:         1.10 cm LV IVS:        1.00 cm LVOT diam:     2.00 cm LV SV:         56 LV SV Index:   31 LVOT Area:     3.14 cm  RIGHT VENTRICLE RV Basal diam:  3.70 cm RV S prime:     12.70 cm/s TAPSE (M-mode): 4.7 cm LEFT ATRIUM             Index       RIGHT ATRIUM           Index LA diam:        3.40 cm 1.87 cm/m  RA Area:     19.90 cm LA Vol (A2C):   67.5 ml 37.07 ml/m RA Volume:   61.80 ml  33.94 ml/m LA Vol (A4C):   26.2 ml 14.39 ml/m LA Biplane Vol: 42.0 ml 23.07 ml/m  AORTIC  VALVE                   PULMONIC VALVE AV Area (Vmax):    2.62 cm    PV Vmax:        1.13 m/s AV Area (Vmean):   2.49 cm    PV Peak grad:   5.1 mmHg AV Area (VTI):     2.80 cm    RVOT Peak grad: 4 mmHg AV Vmax:           95.40 cm/s AV Vmean:          63.300 cm/s AV VTI:            0.200 m AV Peak Grad:      3.6 mmHg AV Mean Grad:      2.0 mmHg LVOT Vmax:         79.60 cm/s LVOT Vmean:        50.200 cm/s LVOT VTI:          0.178 m LVOT/AV VTI ratio: 0.89  AORTA Ao Root diam: 3.83 cm MITRAL VALVE                TRICUSPID VALVE MV Area (PHT): 10.84 cm    TR Peak grad:   33.4 mmHg MV Decel Time: 70 msec      TR Vmax:        289.00 cm/s MV E velocity: 108.00 cm/s                             SHUNTS                             Systemic VTI:  0.18 m                             Systemic Diam: 2.00 cm Nelva Bush MD Electronically signed by Nelva Bush MD Signature Date/Time: 04/05/2021/12:30:29 PM    Final       Labs: BNP (last 3 results) Recent Labs    04/04/21 1544  BNP 2,122.4*   Basic Metabolic Panel: Recent Labs  Lab 04/04/21 1319 04/05/21 0613 04/06/21 0435 04/07/21 0842 04/08/21 0540  NA 129* 139 137 139 135  K 3.7 3.9 3.3* 4.0 3.8  CL 96* 104 107 103 100  CO2 _0 GLUCOSE 107* 158* 130* 97 98  BUN 33* 32* 35* 29* 25*  CREATININE 1.60* 1.42* 1.35* 1.36* 1.32*  CALCIUM 8.5* 8.7* 8.5* 9.0 8.3*  MG  --   --  2.2 2.3 1.9   Liver Function Tests: Recent Labs  Lab 04/04/21 1319  AST 33  ALT 15  ALKPHOS 43  BILITOT 0.9  PROT 7.3  ALBUMIN 3.8   No results for input(s): LIPASE, AMYLASE in the last 168 hours. No results for input(s): AMMONIA in the last 168 hours. CBC: Recent Labs  Lab 04/04/21 1319 04/05/21 0613 04/06/21 0435 04/07/21 0842 04/08/21 0540  WBC 17.0* 11.4* 14.5* 12.1* 7.9  NEUTROABS 15.9*  --   --   --   --   HGB 9.5* 8.8* 7.9* 9.0* 8.7*  HCT 28.2* 27.4* 23.4* 27.5* 25.9*  MCV 83.9 84.8 84.2 83.8 83.3  PLT 238 196 215 288 258   Cardiac  Enzymes: No results for input(s): CKTOTAL, CKMB, CKMBINDEX, TROPONINI in the last 168 hours. BNP:  Invalid input(s): POCBNP CBG: Recent Labs  Lab 04/06/21 0851 04/07/21 0820  GLUCAP 119* 103*   D-Dimer No results for input(s): DDIMER in the last 72 hours. Hgb A1c No results for input(s): HGBA1C in the last 72 hours. Lipid Profile No results for input(s): CHOL, HDL, LDLCALC, TRIG, CHOLHDL, LDLDIRECT in the last 72 hours. Thyroid function studies No results for input(s): TSH, T4TOTAL, T3FREE, THYROIDAB in the last 72 hours.  Invalid input(s): FREET3 Anemia work up No results for input(s): VITAMINB12, FOLATE, FERRITIN, TIBC, IRON, RETICCTPCT in the last 72 hours. Urinalysis    Component Value Date/Time   COLORURINE AMBER (A) 05/15/2020 2103   APPEARANCEUR HAZY (A) 05/15/2020 2103   LABSPEC 1.021 05/15/2020 2103   PHURINE 5.0 05/15/2020 2103   GLUCOSEU NEGATIVE 05/15/2020 2103   HGBUR NEGATIVE 05/15/2020 2103   BILIRUBINUR NEGATIVE 05/15/2020 2103   KETONESUR NEGATIVE 05/15/2020 2103   PROTEINUR 30 (A) 05/15/2020 2103   NITRITE NEGATIVE 05/15/2020 2103   LEUKOCYTESUR NEGATIVE 05/15/2020 2103   Sepsis Labs Invalid input(s): PROCALCITONIN,  WBC,  LACTICIDVEN Microbiology Recent Results (from the past 240 hour(s))  Blood culture (routine x 2)     Status: None (Preliminary result)   Collection Time: 04/04/21  3:44 PM   Specimen: BLOOD  Result Value Ref Range Status   Specimen Description BLOOD BLOOD LEFT FOREARM  Final   Special Requests   Final    BOTTLES DRAWN AEROBIC AND ANAEROBIC Blood Culture results may not be optimal due to an excessive volume of blood received in culture bottles   Culture   Final    NO GROWTH 4 DAYS Performed at [facility], [address], [place], [place] [address]    Report Status PENDING  Incomplete  Blood culture (routine x 2)     Status: None (Preliminary result)   Collection Time: 04/04/21  3:44 PM   Specimen: BLOOD  Result  Value Ref Range Status   Specimen Description BLOOD RIGHT ANTECUBITAL  Final   Special Requests   Final    BOTTLES DRAWN AEROBIC AND ANAEROBIC Blood Culture results may not be optimal due to an inadequate volume of blood received in culture bottles   Culture   Final    NO GROWTH 4 DAYS Performed at [facility], [address], [place], [place] [address]    Report Status PENDING  Incomplete  Resp Panel by RT-PCR (Flu A&B, Covid) Nasopharyngeal Swab     Status: None   Collection Time: 04/04/21  3:44 PM   Specimen: Nasopharyngeal Swab; Nasopharyngeal(NP) swabs in vial transport medium  Result Value Ref Range Status   SARS Coronavirus 2 by RT PCR NEGATIVE NEGATIVE Final    Comment: (NOTE) SARS-CoV-2 target nucleic acids are NOT DETECTED.  The SARS-CoV-2 RNA is generally detectable in upper respiratory specimens during the acute phase of infection. The lowest concentration of SARS-CoV-2 viral copies this assay can detect is 138 copies/mL. A negative result does not preclude SARS-Cov-2 infection and should not be used as the sole basis for treatment or other patient management decisions. A negative result may occur with  improper specimen collection/handling, submission of specimen other than nasopharyngeal swab, presence of viral mutation(s) within the areas targeted by this assay, and inadequate number of viral copies(<138 copies/mL). A negative result must be combined with clinical observations, patient history, and epidemiological information. The expected result is Negative.  Fact Sheet for Patients:  EntrepreneurPulse.com.au  Fact Sheet for Healthcare Providers:  IncredibleEmployment.be  This test is no  t yet approved or cleared by the [place] and  has been authorized for detection and/or diagnosis of SARS-CoV-2 by FDA under an Emergency Use Authorization (EUA). This EUA will remain  in effect (meaning this test can be  used) for the duration of the COVID-19 declaration under Section 564(b)(1) of the Act, 21 U.S.C.section 360bbb-3(b)(1), unless the authorization is terminated  or revoked sooner.       Influenza A by PCR NEGATIVE NEGATIVE Final   Influenza B by PCR NEGATIVE NEGATIVE Final    Comment: (NOTE) The Xpert Xpress SARS-CoV-2/FLU/RSV plus assay is intended as an aid in the diagnosis of influenza from Nasopharyngeal swab specimens and should not be used as a sole basis for treatment. Nasal washings and aspirates are unacceptable for Xpert Xpress SARS-CoV-2/FLU/RSV testing.  Fact Sheet for Patients: EntrepreneurPulse.com.au  Fact Sheet for Healthcare Providers: IncredibleEmployment.be  This test is not yet approved or cleared by the [place] FDA and has been authorized for detection and/or diagnosis of SARS-CoV-2 by FDA under an Emergency Use Authorization (EUA). This EUA will remain in effect (meaning this test can be used) for the duration of the COVID-19 declaration under Section 564(b)(1) of the Act, 21 U.S.C. section 360bbb-3(b)(1), unless the authorization is terminated or revoked.  Performed at [facility], [address] [place], [place] [address]      Total time spend on discharging this patient, including the last patient exam, discussing the hospital stay, instructions for ongoing care as it relates to all pertinent caregivers, as well as preparing the medical discharge records, prescriptions, and/or referrals as applicable, is 30 minutes.    Enzo Bi, MD  [facility] 04/08/2021, 8:59 AM

## 2021-04-08 NOTE — TOC Transition Note (Signed)
Transition of Care [facility]) - CM/SW Discharge Note   Patient Details  Name: Craig Vazquez MRN: [id number] Date of Birth: March 21, 1931  Transition of [facility]) CM/SW Contact:  Kerin Salen, RN Phone Number: 04/08/2021, 5:02 PM   Clinical Narrative:   Patient to discharge home with daughter, Craig Vazquez. Home Health PT arranged with [facility], daughter consents to [facility] service.    Final next level of care: [place] Barriers to Discharge: Barriers Resolved   Patient Goals and CMS Choice Patient states their goals for this hospitalization and ongoing recovery are:: To return home lives with daughter      Discharge Placement                Patient to be transferred to facility by: Daughter Name of family member notified: Tierre Netto Patient and family notified of of transfer: 04/08/21  Discharge Plan and Services                          HH Arranged: PT [facility] Agency: [place] ([place]) Date HH Agency Contacted: 04/08/21 Time HH Agency Contacted: 1200 Representative spoke with at [place]: [place] Determinants of Health ([place]) Interventions     Readmission Risk Interventions No flowsheet data found.

## 2021-04-08 NOTE — Plan of Care (Signed)
DISCHARGE NOTE HOME YUNIOR JAIN to be discharged home per MD order. Discussed prescriptions and follow up appointments with the patient's daughter. Medication list explained. Patient's daughter verbalized understanding.  Skin clean, dry and intact without evidence of skin break down, no evidence of skin tears noted. IV catheter discontinued intact. Site without signs and symptoms of complications. Dressing and pressure applied. Pt denies pain at the site currently. No complaints noted.  Patient free of lines, drains, and wounds.   An After Visit Summary (AVS) was printed and given to the patient. Patient escorted via wheelchair, and discharged home via private auto.  Stephan Minister, RN

## 2021-04-08 NOTE — Plan of Care (Signed)
Patient alert and oriented x 4, denies pain with assess. Strong productive cough with thick tan secretions noted.  Vitals stable, no respiratory distress on room air. No adverse events during shift. Will continue to monitor.  Problem: Education: Goal: Knowledge of General Education information will improve Description: Including pain rating scale, medication(s)/side effects and non-pharmacologic comfort measures Outcome: Progressing   Problem: Health Behavior/Discharge Planning: Goal: Ability to manage health-related needs will improve Outcome: Progressing   Problem: Clinical Measurements: Goal: Ability to maintain clinical measurements within normal limits will improve Outcome: Progressing Goal: Will remain free from infection Outcome: Progressing Goal: Diagnostic test results will improve Outcome: Progressing Goal: Respiratory complications will improve Outcome: Progressing Goal: Cardiovascular complication will be avoided Outcome: Progressing   Problem: Elimination: Goal: Will not experience complications related to bowel motility Outcome: Progressing Goal: Will not experience complications related to urinary retention Outcome: Progressing   Problem: Education: Goal: Knowledge of disease or condition will improve Outcome: Progressing Goal: Knowledge of the prescribed therapeutic regimen will improve Outcome: Progressing Goal: Individualized Educational Video(s) Outcome: Progressing

## 2021-04-09 LAB — CULTURE, BLOOD (ROUTINE X 2)
Culture: NO GROWTH
Culture: NO GROWTH

## 2021-04-12 DIAGNOSIS — J45909 Unspecified asthma, uncomplicated: Secondary | ICD-10-CM | POA: Diagnosis not present

## 2021-04-12 DIAGNOSIS — J189 Pneumonia, unspecified organism: Secondary | ICD-10-CM | POA: Diagnosis not present

## 2021-04-12 DIAGNOSIS — Z23 Encounter for immunization: Secondary | ICD-10-CM | POA: Diagnosis not present

## 2021-04-12 DIAGNOSIS — B370 Candidal stomatitis: Secondary | ICD-10-CM | POA: Diagnosis not present

## 2021-04-12 DIAGNOSIS — N1831 Chronic kidney disease, stage 3a: Secondary | ICD-10-CM | POA: Diagnosis not present

## 2021-04-13 ENCOUNTER — Encounter: Payer: Self-pay | Admitting: Neurology

## 2021-04-13 ENCOUNTER — Ambulatory Visit (INDEPENDENT_AMBULATORY_CARE_PROVIDER_SITE_OTHER): Payer: Medicare Other | Admitting: Neurology

## 2021-04-13 ENCOUNTER — Telehealth: Payer: Self-pay | Admitting: Neurology

## 2021-04-13 VITALS — BP 115/61 | HR 74 | Ht 68.0 in | Wt 140.0 lb

## 2021-04-13 DIAGNOSIS — I69320 Aphasia following cerebral infarction: Secondary | ICD-10-CM

## 2021-04-13 DIAGNOSIS — R4789 Other speech disturbances: Secondary | ICD-10-CM | POA: Diagnosis not present

## 2021-04-13 DIAGNOSIS — R799 Abnormal finding of blood chemistry, unspecified: Secondary | ICD-10-CM

## 2021-04-13 DIAGNOSIS — G20 Parkinson's disease: Secondary | ICD-10-CM

## 2021-04-13 DIAGNOSIS — I63412 Cerebral infarction due to embolism of left middle cerebral artery: Secondary | ICD-10-CM

## 2021-04-13 DIAGNOSIS — G214 Vascular parkinsonism: Secondary | ICD-10-CM

## 2021-04-13 DIAGNOSIS — G20C Parkinsonism, unspecified: Secondary | ICD-10-CM

## 2021-04-13 DIAGNOSIS — R413 Other amnesia: Secondary | ICD-10-CM | POA: Diagnosis not present

## 2021-04-13 MED ORDER — CARBIDOPA-LEVODOPA 25-100 MG PO TABS: 1.0000 | ORAL_TABLET | Freq: Two times a day (BID) | ORAL | 2 refills | Status: DC

## 2021-04-13 NOTE — Telephone Encounter (Signed)
Medicare/tricare order sent to [facility], [facility] they will reach out to the patient to schedule.  °

## 2021-04-13 NOTE — Progress Notes (Signed)
[facility] [address] [place]. [place] [address] [phone]       OFFICE CONSULT NOTE  [name] Date of Birth:  12-06-1930 Medical Record Number:  [id number]   Referring MD: Benita Stabile  Reason for Referral: Aphasia  HPI: Craig Vazquez is a 87 year old pleasant Caucasian male seen today for initial office consultation visit.  He is accompanied by his daughter today.  History is obtained from them and review of electronic medical records and I personally reviewed pertinent available imaging films in PACS.  He has past medical history of asthma, chronic kidney disease, gastroesophageal flux disease, stroke in 2014 and recent admission on 04/04/2021 for respiratory failure due to community-acquired pneumonia and was treated with IV antibiotics..  Patient and daughter say that for a year or so they have noticed some difficulty with speech.  His voice is soft and he has trouble finding words at times he stops in mid sentences unable to speak sentences and has to think about the word.  He also admits to worsening short-term memory and cognitive difficulties.  He lives with his daughter.  He gets occasionally frustrated and agitated but there has not been any violent behavior.  He denies any delusions, hallucinations, nightmares or difficulty sleeping.  He is also noticed increased drooling of saliva from his mouth.  He has some trouble getting up but once he is up he can walk fairly well.  He denies stooped posture, gait festination or freezing while walking.  He has also noticed tremors in both hands but they are absent at rest and noticed only when he is using his hands.  He has not had any recent brain imaging studies done.  MRI scan of the brain on 11/07/2012 had shown innumerable punctate foci of acute infarcts throughout the cerebral hemispheres due to embolic source of cryptogenic etiology.  He was followed in the office and was last seen on 05/17/2015.  He was noted as  having mild word finding difficulties and cognitive impairment at that visit as well.  Last Carotid ultrasound in the electronic medical records is from 07/14/2016 which shows no significant bilateral extracranial stenosis.  2D echo 04/05/2021 shows ejection fraction 60 to 65% without cardiac source of embolism.  Patient has had any work-up for reversible causes of memory loss or brain imaging studies or tried any medications to improve his memory. UPDATE 04/01/13 (LL): Patient returns for 4 month stroke revisit. He has done well, no residual neurological deficits. 30 day cardiac monitor was negative. Outpatient TEE showed a small PFO. His BP is well controlled he states, though it is elevated in office today, 150/70. He states he has his annual visit coming up with his PCP. He states he stays active, working in his yard. He is retired Social research officer, government. He is tolerating daily aspirin 325 mg, with No bleeding or excessive bruising.    UPDATE 11/12/13 (LL): Craig Vazquez returns for routine stroke follow up.  Still doing well, only very mild expressive aphasia when tired.  No new neurovascular symptoms.  His blood pressure remains well controlled without medication.  BP in office today is 107/62.  He is tolerating aspirin well with no signs of significant bleeding or bruising. UPDATE 05/20/2014 (PS) : he returns for follow-up after last visit 6 months ago. He is doing well without recurrent stroke or TIA symptoms. He continues to have mild word finding difficulties particularly when tired but states his memory is good. He is independent in activities of  daily living. He has not had any recurrent neurovascular symptoms. He is tolerating aspirin well without any bleeding or bruising.His blood pressure is under good control He has no new complaints UPDATE 05/17/15  Craig Vazquez , 87 year old male returns for follow-up. He had a stroke 11/07/2012.  MRI that time showed multiple small areas of stroke likely a cryptogenic embolic source.  He has been maintained on aspirin 325 mg daily without further stroke or TIA symptoms.  Carotid Doppler after last visit negative for significant stenosis. Patient given a copy. B/P in the office today 128/60. He continues to be very active , reports that his memory is good. Appetite is good he is sleeping well. He returns for reevaluation ROS:   14 system review of systems is positive for drooling, speech difficulties, tremors, shortness of breath, gait difficulty all other systems negative  PMH:  Past Medical History:  Diagnosis Date   Arthritis 2017   R Knee   Asthma    Diabetes mellitus without complication (HCC)    Borderline diabetic   History of hiatal hernia 2019   Hypoglycemia    Recurrent irreducible left inguinal hernia 04/05/2018   Stroke ([place])    5.1.14   Vertigo     Social History:  Social History   Socioeconomic History   Marital status: Married    Spouse name: Not on file   Number of children: 2   Years of education: DOCTRATE   Highest education level: Not on file  Occupational History   Occupation: retired  Tobacco Use   Smoking status: Former    Types: Cigarettes    Quit date: 1970    Years since quitting: 52.7   Smokeless tobacco: Never   Tobacco comments:    quit 1970  Vaping Use   Vaping Use: Never used  Substance and Sexual Activity   Alcohol use: Yes    Comment: BEER/WINE OCC   Drug use: No   Sexual activity: Not on file  Other Topics Concern   Not on file  Social History Narrative   Lives with daughter, Craig Vazquez ([place])   Right Handed   Drinks 4-5 cups caffeine daily   Social Determinants of Health   Financial Resource Strain: Not on file  Food Insecurity: Not on file  Transportation Needs: Not on file  Physical Activity: Not on file  Stress: Not on file  Social Connections: Not on file  Intimate Partner Violence: Not on file    Medications:   Current Outpatient Medications on File Prior to Visit  Medication Sig Dispense Refill    aspirin 325 MG tablet Take 1 tablet (325 mg total) by mouth daily. 1 tablet 1   fluticasone (FLONASE) 50 MCG/ACT nasal spray Place 1 spray into the nose daily as needed for allergies.      Fluticasone Propionate, Inhal, 100 MCG/BLIST AEPB Inhale 1 Inhaler into the lungs 2 (two) times daily as needed (for respiratory issues.).      guaiFENesin (MUCINEX) 600 MG 12 hr tablet Take 1 tablet (600 mg total) by mouth 2 (two) times daily for 7 days. 14 tablet 0   hydrOXYzine (ATARAX/VISTARIL) 10 MG tablet Take 1 tablet (10 mg total) by mouth 2 (two) times daily as needed (home med). 30 tablet 0   pantoprazole (PROTONIX) 40 MG tablet Take 40 mg by mouth daily before breakfast.     triamcinolone cream (KENALOG) 0.1 % Apply 1 application topically daily as needed (dry skin).      No current facility-administered  medications on file prior to visit.    Allergies:  No Known Allergies  Physical Exam General: Frail elderly Caucasian male, seated, in no evident distress Head: head normocephalic and atraumatic.   Neck: supple with soft left carotid  bruit Cardiovascular: regular rate and rhythm, no murmurs Musculoskeletal: Mild kyphoscoliosis Skin:  no rash/petichiae Vascular:  Normal pulses all extremities  Neurologic Exam Mental Status: Awake and fully alert. Oriented to place and time. Recent and remote memory intact. Attention span, concentration and fund of knowledge appropriate. Mood and affect appropriate.  Mini-Mental status exam score 24/30 with deficits in orientation, attention calculation and recall.  He had difficulty with copying intersecting pentagons.  Clock drawing score was 3/4.  He was able to name only 8 animals which can walk on 4 legs.  Glabellar tap is positive.  Voice is hypophonic without dysarthria or aphasia.  Palmomental reflex is negative.  Geriatric depression scale not depressed Cranial Nerves: Fundoscopic exam reveals sharp disc margins. Pupils equal, briskly reactive to light.  Extraocular movements full without nystagmus. Visual fields full to confrontation. Hearing mildly diminished bilaterally.. Facial sensation intact.  Decreased facial expression., tongue, palate moves normally and symmetrically.  Motor: Normal bulk and tone. Normal strength in all tested extremity muscles.  Absent resting tremor.  Mild fine action tremor of outstretched upper extremities.  Tremor increases with action.  Mild cogwheel rigidity at right wrist upon activation only. Sensory.: intact to touch , pinprick , position and vibratory sensation.  Coordination: Rapid alternating movements normal in all extremities. Finger-to-nose and heel-to-shin performed accurately bilaterally. Gait and Station: Arises from chair with slight difficulty with his arms folded across the chest.  Diminished right arm swing when he walks.. Stance is stooped.  No festination or short steps.  Gait demonstrates normal stride length .mild imbalance when he turns or stands on a narrow base.  Poor postural response to threat..  Not able to heel, toe and tandem walk without difficulty.  Reflexes: 1+ and symmetric. Toes downgoing.     MMSE - Mini Mental State Exam 04/13/2021  Orientation to time 4  Orientation to Place 5  Registration 3  Attention/ Calculation 1  Recall 2  Language- name 2 objects 2  Language- repeat 1  Language- follow 3 step command 3  Language- read & follow direction 1  Write a sentence 1  Copy design 1  Total score 24      ASSESSMENT: 87 year old Caucasian male with subacute speech difficulties and drooling likely from parkinsonism etiology likely vascular given prior history of strokes.  History of bicerebral embolic strokes of cryptogenic etiology in 2014 with residual mild cognitive impairment.       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  He lacks the classical resting tremor for idiopathic Parkinson's.  I recommended treatment trial of Sinemet 25/100 start half a tablet twice daily for a week increase if tolerated without side effects to half a tablet 3 times daily and subsequently to 1 tablet twice daily if tolerated.  Check memory panel labs, EEG and MRI scan of the brain and MR angiogram of the brain and neck to look for interval new strokes..  Continue aspirin for stroke prevention aggressive risk factor modification strict control of hypertension with blood pressure goal below 130/90, lipids with LDL cholesterol goal below 70 mg percent and diabetes with hemoglobin A1c  goal below 6.5%.  We also discussed fall prevention precautions.  He will return for follow-up in the future in 3 months or call earlier if necessary.  Greater than 50% time during this 45-minute consultation visit was spent on counseling and coordination of care about his speech difficulties and previous stroke and for stroke parkinsonism and answering questions.  Antony Contras MD Note: This document was prepared with digital dictation and possible smart phrase technology. Any transcriptional errors that result from this process are unintentional.

## 2021-04-13 NOTE — Patient Instructions (Addendum)
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  He lacks the classical resting tremor for idiopathic Parkinson's.  I recommended treatment trial of Sinemet 25/100 start half a tablet twice daily for a week increase if tolerated without side effects to half a tablet 3 times daily and subsequently to 1 tablet twice daily if tolerated.  Check memory panel labs, EEG and MRI scan of the brain and MR angiogram of the brain and neck to look for interval new strokes..  Continue aspirin for stroke prevention aggressive risk factor modification strict control of hypertension with blood pressure goal below 130/90, lipids with LDL cholesterol goal below 70 mg percent and diabetes with hemoglobin A1c goal below 6.5%.  We also discussed fall prevention precautions.  He will return for follow-up in the future in 3 months or call earlier if necessary. Fall Prevention in the Home, Adult Falls can cause injuries and can happen to people of all ages. There are many things you can do to make your home safe and to help prevent falls. Ask for help when making these changes. What actions can I take to prevent falls? General Instructions Use good lighting in all rooms. Replace any light bulbs that burn out. Turn on the lights in dark areas. Use night-lights. Keep items that you use often in easy-to-reach places. Lower the shelves around your home if needed. Set up your furniture so you have a clear path. Avoid moving your furniture around. Do not have throw rugs or other things on the floor that can make you trip. Avoid walking on wet floors. If any of your floors are uneven, fix them. Add color or contrast paint or tape to clearly mark and help you see: Grab bars or handrails. First and last steps of staircases. Where the edge of each step is. If you use a  stepladder: Make sure that it is fully opened. Do not climb a closed stepladder. Make sure the sides of the stepladder are locked in place. Ask someone to hold the stepladder while you use it. Know where your pets are when moving through your home. What can I do in the bathroom?   Keep the floor dry. Clean up any water on the floor right away. Remove soap buildup in the tub or shower. Use nonskid mats or decals on the floor of the tub or shower. Attach bath mats securely with double-sided, nonslip rug tape. If you need to sit down in the shower, use a plastic, nonslip stool. Install grab bars by the toilet and in the tub and shower. Do not use towel bars as grab bars. What can I do in the bedroom? Make sure that you have a light by your bed that is easy to reach. Do not use any sheets or blankets for your bed that hang to the floor. Have a firm chair with side arms that you can use for support when you get dressed. What can I do in the kitchen? Clean up any spills right away. If you need to reach something above you, use a step stool with a grab bar. Keep electrical cords out of the way. Do not use floor polish or wax that makes floors slippery. What can I do with my stairs? Do not leave any items on the stairs. Make sure that you have a light switch at the top and the bottom of  the stairs. Make sure that there are handrails on both sides of the stairs. Fix handrails that are broken or loose. Install nonslip stair treads on all your stairs. Avoid having throw rugs at the top or bottom of the stairs. Choose a carpet that does not hide the edge of the steps on the stairs. Check carpeting to make sure that it is firmly attached to the stairs. Fix carpet that is loose or worn. What can I do on the outside of my home? Use bright outdoor lighting. Fix the edges of walkways and driveways and fix any cracks. Remove anything that might make you trip as you walk through a door, such as a  raised step or threshold. Trim any bushes or trees on paths to your home. Check to see if handrails are loose or broken and that both sides of all steps have handrails. Install guardrails along the edges of any raised decks and porches. Clear paths of anything that can make you trip, such as tools or rocks. Have leaves, snow, or ice cleared regularly. Use sand or salt on paths during winter. Clean up any spills in your garage right away. This includes grease or oil spills. What other actions can I take? Wear shoes that: Have a low heel. Do not wear high heels. Have rubber bottoms. Feel good on your feet and fit well. Are closed at the toe. Do not wear open-toe sandals. Use tools that help you move around if needed. These include: Canes. Walkers. Scooters. Crutches. Review your medicines with your doctor. Some medicines can make you feel dizzy. This can increase your chance of falling. Ask your doctor what else you can do to help prevent falls. Where to find more information Centers for Disease Control and Prevention, [employer]: http://www.wolf.info/ National Institute on Aging: http://kim-miller.com/ Contact a doctor if: You are afraid of falling at home. You feel weak, drowsy, or dizzy at home. You fall at home. Summary There are many simple things that you can do to make your home safe and to help prevent falls. Ways to make your home safe include removing things that can make you trip and installing grab bars in the bathroom. Ask for help when making these changes in your home. This information is not intended to replace advice given to you by your health care provider. Make sure you discuss any questions you have with your health care provider. Document Revised: 01/28/2020 Document Reviewed: 01/28/2020 Elsevier Patient Education  [place].

## 2021-04-14 LAB — LIPID PANEL
Chol/HDL Ratio: 3.5 ratio (ref 0.0–5.0)
Cholesterol, Total: 171 mg/dL (ref 100–199)
HDL: 49 mg/dL (ref >39)
LDL Chol Calc (NIH): 105 mg/dL — ABNORMAL HIGH (ref 0–99)
Triglycerides: 90 mg/dL (ref 0–149)
VLDL Cholesterol Cal: 17 mg/dL (ref 5–40)

## 2021-04-14 LAB — DEMENTIA PANEL
Homocysteine: 24.8 umol/L — ABNORMAL HIGH (ref 0.0–21.3)
RPR Ser Ql: NONREACTIVE
TSH: 0.977 u[IU]/mL (ref 0.450–4.500)
Vitamin B-12: 347 pg/mL (ref 232–1245)

## 2021-04-14 LAB — HEMOGLOBIN A1C
Est. average glucose Bld gHb Est-mCnc: 134 mg/dL
Hgb A1c MFr Bld: 6.3 % — ABNORMAL HIGH (ref 4.8–5.6)

## 2021-04-25 ENCOUNTER — Telehealth: Payer: Self-pay | Admitting: *Deleted

## 2021-04-25 MED ORDER — FOLIC ACID 1 MG PO TABS: 1.0000 mg | ORAL_TABLET | Freq: Every day | ORAL | 1 refills | Status: DC

## 2021-04-25 MED ORDER — ATORVASTATIN CALCIUM 40 MG PO TABS: 40.0000 mg | ORAL_TABLET | Freq: Every day | ORAL | 1 refills | Status: DC

## 2021-04-25 NOTE — Addendum Note (Signed)
Addended by: Noberto Retort C on: 04/25/2021 09:16 AM   Modules accepted: Orders

## 2021-04-25 NOTE — Telephone Encounter (Signed)
-----   Message from Garvin Fila, MD sent at 04/22/2021  4:01 PM EDT ----- Craig Vazquez inform the patient that lab work suggest that his bad cholesterol is high and he needs to be started on cholesterol medication Lipitor 40 mg daily if he is willing to take it.  Screening test for diabetes was borderline but satisfactory.  Lab work for reversible causes of memory loss was mostly okay except for slightly elevated homocystine level and for this if he is smoking he needs to quit otherwise I will start him on folic acid 1 mg tablet daily which should help bring it down. ----- Message ----- From: Interface, [facility] Lab Results In Sent: 04/14/2021   7:38 AM EDT To: Garvin Fila, MD

## 2021-04-25 NOTE — Telephone Encounter (Signed)
I spoke to the patient's daughter on DPR (she is also his POA). She verbalized understanding of the lab results. Agreeable to both atorvastatin 40mg , one tab daily and folic acid 1mg , one tab daily. E-scribed prescription prepared and sent to Dr. Leonie Man for a co-sign.

## 2021-04-26 ENCOUNTER — Other Ambulatory Visit: Payer: Medicare Other

## 2021-04-26 ENCOUNTER — Ambulatory Visit (INDEPENDENT_AMBULATORY_CARE_PROVIDER_SITE_OTHER): Payer: Medicare Other | Admitting: Neurology

## 2021-04-26 ENCOUNTER — Other Ambulatory Visit: Payer: Medicare Other | Admitting: *Deleted

## 2021-04-26 DIAGNOSIS — R410 Disorientation, unspecified: Secondary | ICD-10-CM | POA: Diagnosis not present

## 2021-04-26 DIAGNOSIS — R413 Other amnesia: Secondary | ICD-10-CM

## 2021-05-01 ENCOUNTER — Ambulatory Visit
Admission: RE | Admit: 2021-05-01 | Discharge: 2021-05-01 | Disposition: A | Discharge status: Home / Self Care | Payer: Medicare Other | Source: Ambulatory Visit | Attending: Neurology | Admitting: Neurology

## 2021-05-01 ENCOUNTER — Other Ambulatory Visit: Payer: Self-pay

## 2021-05-01 DIAGNOSIS — R4789 Other speech disturbances: Secondary | ICD-10-CM | POA: Diagnosis not present

## 2021-05-01 DIAGNOSIS — R413 Other amnesia: Secondary | ICD-10-CM | POA: Diagnosis not present

## 2021-05-01 MED ORDER — GADOBENATE DIMEGLUMINE 529 MG/ML IV SOLN
12.0000 mL | Freq: Once | INTRAVENOUS | Status: AC | PRN
  Administered 2021-05-01: 12 mL via INTRAVENOUS

## 2021-05-09 ENCOUNTER — Ambulatory Visit: Payer: Medicare Other | Admitting: Neurology

## 2021-05-23 DIAGNOSIS — K279 Peptic ulcer, site unspecified, unspecified as acute or chronic, without hemorrhage or perforation: Secondary | ICD-10-CM | POA: Diagnosis not present

## 2021-05-23 DIAGNOSIS — M179 Osteoarthritis of knee, unspecified: Secondary | ICD-10-CM | POA: Diagnosis not present

## 2021-05-24 ENCOUNTER — Telehealth: Payer: Self-pay | Admitting: Orthopaedic Surgery

## 2021-05-24 NOTE — Telephone Encounter (Signed)
Noted  

## 2021-05-24 NOTE — Telephone Encounter (Signed)
Pt called requesting a call back from April. Pt would like another gel injection. Last gel injection was 4/22. Please call pt about this matter at [phone].

## 2021-05-25 ENCOUNTER — Telehealth: Payer: Self-pay | Admitting: Orthopaedic Surgery

## 2021-05-25 NOTE — Telephone Encounter (Signed)
Talked with patient concerning gel injection.  Appointment has been scheduled.

## 2021-05-25 NOTE — Telephone Encounter (Signed)
Noted  

## 2021-05-25 NOTE — Telephone Encounter (Signed)
See message.

## 2021-05-25 NOTE — Telephone Encounter (Signed)
Patient request Monovisc injection has appointment 05/31/21 @ 3:15 pm with Dr. Erlinda Hong. Last injection 10/30/20.

## 2021-05-26 ENCOUNTER — Telehealth: Payer: Self-pay

## 2021-05-26 NOTE — Telephone Encounter (Signed)
Approved for Monovisc, left knee [place] deductible has been met Secondary insurance, [employer] will pick up remaining eligible expenses at 100% No Co-pay No PA required  Appt. 05/31/2021 with Dr. Erlinda Hong.

## 2021-05-31 ENCOUNTER — Ambulatory Visit: Payer: Medicare Other | Admitting: Orthopaedic Surgery

## 2021-06-07 ENCOUNTER — Other Ambulatory Visit: Payer: Self-pay

## 2021-06-07 ENCOUNTER — Ambulatory Visit (INDEPENDENT_AMBULATORY_CARE_PROVIDER_SITE_OTHER): Payer: Medicare Other | Admitting: Orthopaedic Surgery

## 2021-06-07 ENCOUNTER — Encounter: Payer: Self-pay | Admitting: Orthopaedic Surgery

## 2021-06-07 DIAGNOSIS — M1712 Unilateral primary osteoarthritis, left knee: Secondary | ICD-10-CM | POA: Diagnosis not present

## 2021-06-07 MED ORDER — LIDOCAINE HCL 1 % IJ SOLN
2.0000 mL | INTRAMUSCULAR | Status: AC | PRN
  Administered 2021-06-07: 2 mL

## 2021-06-07 MED ORDER — BUPIVACAINE HCL 0.25 % IJ SOLN
2.0000 mL | INTRAMUSCULAR | Status: AC | PRN
  Administered 2021-06-07: 2 mL via INTRA_ARTICULAR

## 2021-06-07 MED ORDER — HYALURONAN 88 MG/4ML IX SOSY
88.0000 mg | PREFILLED_SYRINGE | INTRA_ARTICULAR | Status: AC | PRN
  Administered 2021-06-07: 88 mg via INTRA_ARTICULAR

## 2021-06-07 NOTE — Progress Notes (Signed)
Office Visit Note   Patient: Craig Vazquez           Date of Birth: Feb 19, 1931           MRN: [id number] Visit Date: 06/07/2021              Requested by: Wenda Low, MD [address] [place],  [place] [address] PCP: Wenda Low, MD   Assessment & Plan: Visit Diagnoses:  1. Unilateral primary osteoarthritis, left knee     Plan: Impression is left knee osteoarthritis.  Today, we proceeded with left knee Monovisc injection.  He tolerated this well.  He will follow-up with [place] as needed.  Call with concerns or questions.  Follow-Up Instructions: Return if symptoms worsen or fail to improve.   Orders:  Orders Placed This Encounter  Procedures   Large Joint Inj: L knee   No orders of the defined types were placed in this encounter.     Procedures: Large Joint Inj: L knee on 06/07/2021 10:16 AM Indications: pain Details: 22 G needle, anterolateral approach Medications: 2 mL lidocaine 1 %; 2 mL bupivacaine 0.25 %; 88 mg Hyaluronan 88 MG/4ML     Clinical Data: No additional findings.   Subjective: Chief Complaint  Patient presents with   Left Knee - Pain    HPI patient is a pleasant 87 year old gentleman with left knee osteoarthritis who comes in today for Monovisc injection to the left knee.  He has had this injection in the past with good relief.     Objective: Vital Signs: There were no vitals taken for this visit.    Ortho Exam stable left knee exam  Specialty Comments:  No specialty comments available.  Imaging: No new imaging   PMFS History: Patient Active Problem List   Diagnosis Date Noted   CAP (community acquired pneumonia) 04/04/2021   Asthma exacerbation 04/04/2021   Sepsis ([place]) 04/04/2021   CKD (chronic kidney disease), stage IIIa 04/04/2021   Stroke ([place]) 04/04/2021   GERD (gastroesophageal reflux disease) 04/04/2021   Acute respiratory failure with hypoxia ([place]) 04/04/2021   Hyponatremia 04/04/2021   Right  inguinal hernia 05/11/2020   Primary osteoarthritis of left knee 09/23/2019   Recurrent incarcerated left inguinal hernia s/p open repair 04/06/2018 04/05/2018   Stroke of unknown etiology ([place]) 12/12/2012   Asthma, chronic 11/08/2012   BPPV (benign paroxysmal positional vertigo) 11/08/2012   Expressive aphasia 11/07/2012   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   Past Medical History:  Diagnosis Date   Arthritis 2017   R Knee   Asthma    Diabetes mellitus without complication ([place])    Borderline diabetic   History of hiatal hernia 2019   Hypoglycemia    Recurrent irreducible left inguinal hernia 04/05/2018   Stroke ([facility])    5.1.14   Vertigo     Family History  Problem Relation Age of Onset   Cancer Brother        lung ca x 2 brothers    Past Surgical History:  Procedure Laterality Date   DENTAL SURGERY  2008   implants upper and lower   ESOPHAGOGASTRODUODENOSCOPY (EGD) WITH PROPOFOL N/A 08/13/2018   Procedure: ESOPHAGOGASTRODUODENOSCOPY (EGD) WITH PROPOFOL;  Surgeon: Otis Brace, MD;  Location: [facility];  Service: Gastroenterology;  Laterality: N/A;   EYE SURGERY Bilateral    Cataract   HAND SURGERY     rt hand   [place]     HERNIA REPAIR  2017   INGUINAL HERNIA REPAIR  04/05/2018  OPEN    INGUINAL HERNIA REPAIR Left 04/05/2018   Procedure: OPEN REPAIR OF LEFT INGUINAL HERNIA;  Surgeon: Fanny Skates, MD;  Location: [facility];  Service: General;  Laterality: Left;   INGUINAL HERNIA REPAIR Right 05/11/2020   Procedure: REPAIR OF RIGHT INGUINAL HERNIA WITH MESH;  Surgeon: Erroll Luna, MD;  Location: [facility];  Service: General;  Laterality: Right;   INSERTION OF MESH Left 04/05/2018   Procedure: INSERTION OF MESH;  Surgeon: Fanny Skates, MD;  Location: [facility];  Service: General;  Laterality: Left;   INSERTION OF MESH Right 05/11/2020   Procedure: INSERTION OF MESH;  Surgeon: Erroll Luna, MD;  Location: [facility];  Service: General;  Laterality: Right;   LAPAROSCOPIC  GASTROTOMY [place] SURGERY  2017   TEE WITHOUT CARDIOVERSION N/A 01/16/2013   Procedure: TRANSESOPHAGEAL ECHOCARDIOGRAM (TEE);  Surgeon: Thayer Headings, MD;  Location: [facility]COPY;  Service: Cardiovascular;  Laterality: N/A;   Social History   Occupational History   Occupation: retired  Tobacco Use   Smoking status: Former    Types: Cigarettes    Quit date: 1970    Years since quitting: 52.9   Smokeless tobacco: Never   Tobacco comments:    quit 1970  Vaping Use   Vaping Use: Never used  Substance and Sexual Activity   Alcohol use: Yes    Comment: BEER/WINE OCC   Drug use: No   Sexual activity: Not on file

## 2021-06-21 ENCOUNTER — Other Ambulatory Visit: Payer: Self-pay

## 2021-06-21 ENCOUNTER — Encounter: Payer: Self-pay | Admitting: Orthopaedic Surgery

## 2021-06-21 ENCOUNTER — Ambulatory Visit (INDEPENDENT_AMBULATORY_CARE_PROVIDER_SITE_OTHER): Payer: Medicare Other | Admitting: Orthopaedic Surgery

## 2021-06-21 DIAGNOSIS — M1712 Unilateral primary osteoarthritis, left knee: Secondary | ICD-10-CM

## 2021-06-21 DIAGNOSIS — I63412 Cerebral infarction due to embolism of left middle cerebral artery: Secondary | ICD-10-CM

## 2021-06-21 MED ORDER — LIDOCAINE HCL 1 % IJ SOLN
2.0000 mL | INTRAMUSCULAR | Status: AC | PRN
  Administered 2021-06-21: 2 mL

## 2021-06-21 MED ORDER — METHYLPREDNISOLONE ACETATE 40 MG/ML IJ SUSP
40.0000 mg | INTRAMUSCULAR | Status: AC | PRN
Start: 2021-06-21 — End: 2021-06-21
  Administered 2021-06-21: 40 mg via INTRA_ARTICULAR

## 2021-06-21 MED ORDER — BUPIVACAINE HCL 0.5 % IJ SOLN
2.0000 mL | INTRAMUSCULAR | Status: AC | PRN
  Administered 2021-06-21: 2 mL via INTRA_ARTICULAR

## 2021-06-21 NOTE — Progress Notes (Signed)
Office Visit Note   Patient: Craig Vazquez           Date of Birth: 1931/01/27           MRN: [id number] Visit Date: 06/21/2021              Requested by: Wenda Low, MD [address] [place],  [place] [address] PCP: Wenda Low, MD   Assessment & Plan: Visit Diagnoses:  1. Primary osteoarthritis of left knee     Plan: Craig Vazquez returns today for acute left knee pain.  He underwent a Monovisc injection about 2 weeks ago and a week afterwards he developed increasing pain.  He is having difficulty with walking and ambulation and has a significant limp.  Denies any constitutional symptoms.  The left knee does show a trace effusion.  Mild limitation range of motion secondary to pain.  Antalgic gait due to pain.  We injected the knee with cortisone today to hopefully calm things down.  Sounds like he is having a postinjection flareup.  He will monitor for any signs or symptoms of septic arthritis.  Follow-Up Instructions: No follow-ups on file.   Orders:  No orders of the defined types were placed in this encounter.  No orders of the defined types were placed in this encounter.     Procedures: Large Joint Inj: L knee on 06/21/2021 1:54 PM Details: 22 G needle Medications: 2 mL bupivacaine 0.5 %; 2 mL lidocaine 1 %; 40 mg methylPREDNISolone acetate 40 MG/ML Outcome: tolerated well, no immediate complications Patient was prepped and draped in the usual sterile fashion.      Clinical Data: No additional findings.   Subjective: Chief Complaint  Patient presents with   Left Knee - Pain, Follow-up    HPI  Review of Systems   Objective: Vital Signs: There were no vitals taken for this visit.  Physical Exam  Ortho Exam  Specialty Comments:  No specialty comments available.  Imaging: No results found.   PMFS History: Patient Active Problem List   Diagnosis Date Noted   CAP (community acquired pneumonia) 04/04/2021   Asthma  exacerbation 04/04/2021   Sepsis ([place]) 04/04/2021   CKD (chronic kidney disease), stage IIIa 04/04/2021   Stroke ([place]) 04/04/2021   GERD (gastroesophageal reflux disease) 04/04/2021   Acute respiratory failure with hypoxia ([place]) 04/04/2021   Hyponatremia 04/04/2021   Right inguinal hernia 05/11/2020   Primary osteoarthritis of left knee 09/23/2019   Recurrent incarcerated left inguinal hernia s/p open repair 04/06/2018 04/05/2018   Stroke of unknown etiology ([place]) 12/12/2012   Asthma, chronic 11/08/2012   BPPV (benign paroxysmal positional vertigo) 11/08/2012   Expressive aphasia 11/07/2012   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   Past Medical History:  Diagnosis Date   Arthritis 2017   R Knee   Asthma    Diabetes mellitus without complication ([place])    Borderline diabetic   History of hiatal hernia 2019   Hypoglycemia    Recurrent irreducible left inguinal hernia 04/05/2018   Stroke ([place])    5.1.14   Vertigo     Family History  Problem Relation Age of Onset   Cancer Brother        lung ca x 2 brothers    Past Surgical History:  Procedure Laterality Date   DENTAL SURGERY  2008   implants upper and lower   ESOPHAGOGASTRODUODENOSCOPY (EGD) WITH PROPOFOL N/A 08/13/2018   Procedure: ESOPHAGOGASTRODUODENOSCOPY (EGD) WITH PROPOFOL;  Surgeon: Otis Brace, MD;  Location:  [facility];  Service: Gastroenterology;  Laterality: N/A;   EYE SURGERY Bilateral    Cataract   HAND SURGERY     rt hand   herina     HERNIA REPAIR  2017   INGUINAL HERNIA REPAIR  04/05/2018   OPEN    INGUINAL HERNIA REPAIR Left 04/05/2018   Procedure: OPEN REPAIR OF LEFT INGUINAL HERNIA;  Surgeon: Fanny Skates, MD;  Location: [facility];  Service: General;  Laterality: Left;   INGUINAL HERNIA REPAIR Right 05/11/2020   Procedure: REPAIR OF RIGHT INGUINAL HERNIA WITH MESH;  Surgeon: Erroll Luna, MD;  Location: [facility];  Service: General;  Laterality: Right;   INSERTION OF MESH Left 04/05/2018   Procedure: INSERTION  OF MESH;  Surgeon: Fanny Skates, MD;  Location: [facility];  Service: General;  Laterality: Left;   INSERTION OF MESH Right 05/11/2020   Procedure: INSERTION OF MESH;  Surgeon: Erroll Luna, MD;  Location: [facility];  Service: General;  Laterality: Right;   LAPAROSCOPIC GASTROTOMY [place] SURGERY  2017   TEE WITHOUT CARDIOVERSION N/A 01/16/2013   Procedure: TRANSESOPHAGEAL ECHOCARDIOGRAM (TEE);  Surgeon: Thayer Headings, MD;  Location: [facility];  Service: Cardiovascular;  Laterality: N/A;   Social History   Occupational History   Occupation: retired  Tobacco Use   Smoking status: Former    Types: Cigarettes    Quit date: 1970    Years since quitting: 52.9   Smokeless tobacco: Never   Tobacco comments:    quit 1970  Vaping Use   Vaping Use: Never used  Substance and Sexual Activity   Alcohol use: Yes    Comment: BEER/WINE OCC   Drug use: No   Sexual activity: Not on file

## 2021-07-14 DIAGNOSIS — H40013 Open angle with borderline findings, low risk, bilateral: Secondary | ICD-10-CM | POA: Diagnosis not present

## 2021-07-19 DIAGNOSIS — L298 Other pruritus: Secondary | ICD-10-CM | POA: Diagnosis not present

## 2021-07-19 DIAGNOSIS — L820 Inflamed seborrheic keratosis: Secondary | ICD-10-CM | POA: Diagnosis not present

## 2021-07-19 DIAGNOSIS — L853 Xerosis cutis: Secondary | ICD-10-CM | POA: Diagnosis not present

## 2021-07-19 DIAGNOSIS — L308 Other specified dermatitis: Secondary | ICD-10-CM | POA: Diagnosis not present

## 2021-08-04 ENCOUNTER — Ambulatory Visit (INDEPENDENT_AMBULATORY_CARE_PROVIDER_SITE_OTHER): Payer: Medicare Other | Admitting: Neurology

## 2021-08-04 ENCOUNTER — Other Ambulatory Visit: Payer: Self-pay

## 2021-08-04 VITALS — BP 131/57 | HR 72 | Ht 68.0 in | Wt 144.0 lb

## 2021-08-04 DIAGNOSIS — G301 Alzheimer's disease with late onset: Secondary | ICD-10-CM

## 2021-08-04 DIAGNOSIS — R413 Other amnesia: Secondary | ICD-10-CM | POA: Diagnosis not present

## 2021-08-04 DIAGNOSIS — G214 Vascular parkinsonism: Secondary | ICD-10-CM

## 2021-08-04 DIAGNOSIS — F02A0 Dementia in other diseases classified elsewhere, mild, without behavioral disturbance, psychotic disturbance, mood disturbance, and anxiety: Secondary | ICD-10-CM | POA: Diagnosis not present

## 2021-08-04 MED ORDER — MEMANTINE HCL 28 X 5 MG & 21 X 10 MG PO TABS: ORAL_TABLET | ORAL | 12 refills | Status: DC

## 2021-08-04 MED ORDER — MEMANTINE HCL 10 MG PO TABS: 10.0000 mg | ORAL_TABLET | Freq: Two times a day (BID) | ORAL | 3 refills | Status: DC

## 2021-08-04 NOTE — Patient Instructions (Addendum)
I had a long discussion the patient and his daughter regarding his memory loss and mild dementia and discussed results of lab work and imaging studies and answered questions.  Recommend continue folic acid for elevated homocystine and check repeat homocystine level today.  Trial of Namenda starter pack and increase if tolerated to 10 mg twice daily.  Patient encouraged to increase participation in cognitively challenging activities like solving crossword puzzles, playing bridge and sodoku and we discussed memory compensation strategies.  He will continue on aspirin for stroke prevention and maintain aggressive risk factor modification strict control of hypertension with blood pressure goal below 130/90 and lipids with LDL cholesterol goal below 70 mg percent.  Return for follow-up in the future in 3 months or call earlier if necessary. Memory Compensation Strategies  Use "WARM" strategy.  W= write it down  A= associate it  R= repeat it  M= make a mental note  2.   You can keep a Social worker.  Use a 3-ring notebook with sections for the following: calendar, important names and phone numbers,  medications, doctors' names/phone numbers, lists/reminders, and a section to journal what you did  each day.   3.    Use a calendar to write appointments down.  4.    Write yourself a schedule for the day.  This can be placed on the calendar or in a separate section of the Memory Notebook.  Keeping a  regular schedule can help memory.  5.    Use medication organizer with sections for each day or morning/evening pills.  You may need help loading it  6.    Keep a basket, or pegboard by the door.  Place items that you need to take out with you in the basket or on the pegboard.  You may also want to  include a message board for reminders.  7.    Use sticky notes.  Place sticky notes with reminders in a place where the task is performed.  For example: " turn off the  stove" placed by the stove, "lock the  door" placed on the door at eye level, " take your medications" on  the bathroom mirror or by the place where you normally take your medications.  8.    Use alarms/timers.  Use while cooking to remind yourself to check on food or as a reminder to take your medicine, or as a  reminder to make a call, or as a reminder to perform another task, etc.

## 2021-08-04 NOTE — Progress Notes (Signed)
[facility] [address] [place]. [place] [address] [phone]       OFFICE FOLLOW UP VISIT NOTE  [name] Date of Birth:  [birth year] Medical Record Number:  [id number]   Referring MD: Benita Stabile  Reason for Referral: Aphasia  HPI:  Update 08/04/2021 : He returns for follow-up after last visit 3 months ago.  Is accompanied by his daughter.  Patient was able to tolerate the Sinemet but did not notice significant improvement in his voice or drooling and hence he stopped it when he ran out of the prescription.  He continues to have some drooling and soft voice but it is unchanged and not getting worse.  He denies any tremor or significant bradykinesia.  Patient continues to have memory difficulties which daughter feels may be slightly worse.  He also is occasionally stops in mid sentences and searches for words.  On the Mini-Mental status exam today he scored 22/30 which was slightly declined from 24/30 at the last visit.  Patient had MRI scan of the brain done on 05/01/2021 which showed moderate generalized atrophy and mild changes of age-related small vessel disease.  MRI of the brain showed no significant large vessel stenosis or occlusion.  Lab work on 04/13/2021 showed normal vitamin B12, TSH and RPR.  Homocystine was slightly elevated on 24.8.  Patient has been started on folic acid 1 mg daily which he has been taking regularly.  Hemoglobin A1c was 6.3 and LDL cholesterol was 105 mg percent.  He has no other new complaints.  He continues to live and is mostly independent and needs only mild supervision. office visit 04/13/2021 [name] is a 88 year old pleasant Caucasian male seen today for initial office consultation visit.  He is accompanied by his daughter today.  History is obtained from them and review of electronic medical records and I personally reviewed pertinent available imaging films in PACS.  He has past medical history of asthma, chronic kidney  disease, gastroesophageal flux disease, stroke in 2014 and recent admission on 04/04/2021 for respiratory failure due to community-acquired pneumonia and was treated with IV antibiotics..  Patient and daughter say that for a year or so they have noticed some difficulty with speech.  His voice is soft and he has trouble finding words at times he stops in mid sentences unable to speak sentences and has to think about the word.  He also admits to worsening short-term memory and cognitive difficulties.  He lives with his daughter.  He gets occasionally frustrated and agitated but there has not been any violent behavior.  He denies any delusions, hallucinations, nightmares or difficulty sleeping.  He is also noticed increased drooling of saliva from his mouth.  He has some trouble getting up but once he is up he can walk fairly well.  He denies stooped posture, gait festination or freezing while walking.  He has also noticed tremors in both hands but they are absent at rest and noticed only when he is using his hands.  He has not had any recent brain imaging studies done.  MRI scan of the brain on 11/07/2012 had shown innumerable punctate foci of acute infarcts throughout the cerebral hemispheres due to embolic source of cryptogenic etiology.  He was followed in the office and was last seen on 05/17/2015.  He was noted as having mild word finding difficulties and cognitive impairment at that visit as well.  Last Carotid ultrasound in the electronic medical records is from 07/14/2016 which shows  no significant bilateral extracranial stenosis.  2D echo 04/05/2021 shows ejection fraction 60 to 65% without cardiac source of embolism.  Patient has had any work-up for reversible causes of memory loss or brain imaging studies or tried any medications to improve his memory. UPDATE 04/01/13 ([place]): Patient returns for 4 month stroke revisit. He has done well, no residual neurological deficits. 30 day cardiac monitor was negative.  Outpatient TEE showed a small PFO. His BP is well controlled he states, though it is elevated in office today, 150/70. He states he has his annual visit coming up with his PCP. He states he stays active, working in his yard. He is retired Social research officer, government. He is tolerating daily aspirin 325 mg, with No bleeding or excessive bruising.    UPDATE 11/12/13 (LL): [name] returns for routine stroke follow up.  Still doing well, only very mild expressive aphasia when tired.  No new neurovascular symptoms.  His blood pressure remains well controlled without medication.  BP in office today is 107/62.  He is tolerating aspirin well with no signs of significant bleeding or bruising. UPDATE 05/20/2014 (PS) : he returns for follow-up after last visit 6 months ago. He is doing well without recurrent stroke or TIA symptoms. He continues to have mild word finding difficulties particularly when tired but states his memory is good. He is independent in activities of daily living. He has not had any recurrent neurovascular symptoms. He is tolerating aspirin well without any bleeding or bruising.His blood pressure is under good control He has no new complaints UPDATE 05/17/15  [name] , 88 year old male returns for follow-up. He had a stroke 11/07/2012.  MRI that time showed multiple small areas of stroke likely a cryptogenic embolic source. He has been maintained on aspirin 325 mg daily without further stroke or TIA symptoms.  Carotid Doppler after last visit negative for significant stenosis. Patient given a copy. B/P in the office today 128/60. He continues to be very active , reports that his memory is good. Appetite is good he is sleeping well. He returns for reevaluation ROS:   14 system review of systems is positive for drooling, speech difficulties, tremors, shortness of breath, gait difficulty all other systems negative  PMH:  Past Medical History:  Diagnosis Date   Arthritis 2017   R Knee   Asthma    Diabetes mellitus  without complication ([facility])    Borderline diabetic   History of hiatal hernia 2019   Hypoglycemia    Recurrent irreducible left inguinal hernia 04/05/2018   Stroke ([place])    5.1.14   Vertigo     Social History:  Social History   Socioeconomic History   Marital status: Married    Spouse name: Not on file   Number of children: 2   Years of education: DOCTRATE   Highest education level: Not on file  Occupational History   Occupation: retired  Tobacco Use   Smoking status: Former    Types: Cigarettes    Quit date: 1970    Years since quitting: 53.1   Smokeless tobacco: Never   Tobacco comments:    quit 1970  Vaping Use   Vaping Use: Never used  Substance and Sexual Activity   Alcohol use: Yes    Comment: BEER/WINE OCC   Drug use: No   Sexual activity: Not on file  Other Topics Concern   Not on file  Social History Narrative   Lives with daughter, Craig Vazquez ([place])   Right Handed  Drinks 4-5 cups caffeine daily   Social Determinants of Health   Financial Resource Strain: Not on file  Food Insecurity: Not on file  Transportation Needs: Not on file  Physical Activity: Not on file  Stress: Not on file  Social Connections: Not on file  Intimate Partner Violence: Not on file    Medications:   Current Outpatient Medications on File Prior to Visit  Medication Sig Dispense Refill   aspirin 325 MG tablet Take 1 tablet (325 mg total) by mouth daily. 1 tablet 1   atorvastatin (LIPITOR) 40 MG tablet Take 1 tablet (40 mg total) by mouth daily. 90 tablet 1   fluticasone (FLONASE) 50 MCG/ACT nasal spray Place 1 spray into the nose daily as needed for allergies.      Fluticasone Propionate, Inhal, 100 MCG/BLIST AEPB Inhale 1 Inhaler into the lungs 2 (two) times daily as needed (for respiratory issues.).      folic acid (FOLVITE) 1 MG tablet Take 1 tablet (1 mg total) by mouth daily. 90 tablet 1   hydrOXYzine (ATARAX/VISTARIL) 10 MG tablet Take 1 tablet (10 mg total) by mouth 2 (two)  times daily as needed (home med). 30 tablet 0   pantoprazole (PROTONIX) 40 MG tablet Take 40 mg by mouth daily before breakfast.     triamcinolone cream (KENALOG) 0.1 % Apply 1 application topically daily as needed (dry skin).      No current facility-administered medications on file prior to visit.    Allergies:  No Known Allergies  Physical Exam General: Frail elderly Caucasian male, seated, in no evident distress Head: head normocephalic and atraumatic.   Neck: supple with soft left carotid  bruit Cardiovascular: regular rate and rhythm, no murmurs Musculoskeletal: Mild kyphoscoliosis Skin:  no rash/petichiae Vascular:  Normal pulses all extremities  Neurologic Exam Mental Status: Awake and fully alert. Oriented to place and time. Recent and remote memory intact. Attention span, concentration and fund of knowledge appropriate. Mood and affect appropriate.  Mini-Mental status exam score 22/30 ( last visit 24/30 )with deficits in orientation, attention calculation and recall.  He had difficulty with copying intersecting pentagons.  Clock drawing score was 3/4.  He was able to name only 8 animals which can walk on 4 legs.  Glabellar tap is positive.  Voice is hypophonic without dysarthria or aphasia.  Palmomental reflex is negative.  Geriatric depression scale not depressed Cranial Nerves: Fundoscopic exam not done. Pupils equal, briskly reactive to light. Extraocular movements full without nystagmus. Visual fields full to confrontation. Hearing mildly diminished bilaterally.. Facial sensation intact.  Decreased facial expression., tongue, palate moves normally and symmetrically.  Motor: Normal bulk and tone. Normal strength in all tested extremity muscles.  Absent resting tremor.  Mild fine action tremor of outstretched upper extremities.  Tremor increases with action.  Mild cogwheel rigidity at right wrist upon activation only. Sensory.: intact to touch , pinprick , position and vibratory  sensation.  Coordination: Rapid alternating movements normal in all extremities. Finger-to-nose and heel-to-shin performed accurately bilaterally. Gait and Station: Arises from chair with slight difficulty with his arms folded across the chest.  Diminished right arm swing when he walks.. Stance is stooped.  No festination or short steps.  Gait demonstrates normal stride length .mild imbalance when he turns or stands on a narrow base.  Poor postural response to threat..  Not able to heel, toe and tandem walk without difficulty.  Reflexes: 1+ and symmetric. Toes downgoing.     MMSE - Mini Mental  State Exam 08/04/2021 04/13/2021  Orientation to time 5 4  Orientation to Place 5 5  Registration 3 3  Attention/ Calculation 0 1  Recall 0 2  Language- name 2 objects 2 2  Language- repeat 1 1  Language- follow 3 step command 3 3  Language- read & follow direction 1 1  Write a sentence 1 1  Copy design 1 1  Total score 22 24      ASSESSMENT: 88 year old Caucasian male with subacute speech difficulties and drooling likely from parkinsonism etiology likely vascular given prior history of strokes.  History of bicerebral embolic strokes of cryptogenic etiology in 2014 with residual mild cognitive impairment.       PLAN: I had a long discussion the patient and his daughter regarding his memory loss and mild dementia and discussed results of lab work and imaging studies and answered questions.  Recommend continue folic acid for elevated homocystine and check repeat homocystine level today.  Trial of Namenda starter pack and increase if tolerated to 10 mg twice daily.  Patient encouraged to increase participation in cognitively challenging activities like solving crossword puzzles, playing bridge and sodoku and we discussed memory compensation strategies.  He will continue on aspirin for stroke prevention and maintain aggressive risk factor modification strict control of hypertension with blood pressure goal  below 130/90 and lipids with LDL cholesterol goal below 70 mg percent.  Return for follow-up in the future in 3 months or call earlier if necessary.Greater than 50% time during this 35-minute   visit was spent on counseling and coordination of care about his speech difficulties and previous stroke and for stroke, dementia, parkinsonism and answering questions.  Antony Contras MD Note: This document was prepared with digital dictation and possible smart phrase technology. Any transcriptional errors that result from this process are unintentional.

## 2021-08-05 LAB — HOMOCYSTEINE: Homocysteine: 18.6 umol/L (ref 0.0–21.3)

## 2021-08-08 ENCOUNTER — Telehealth: Payer: Self-pay | Admitting: Neurology

## 2021-08-08 NOTE — Telephone Encounter (Signed)
Pt's daughter called stating that the pt is scared to take the memantine [facility] TITRATION PAK) tablet pack due to the side affects it has. Pt is being treated for those side affects and is wanting to know if the medication will not make them worse. Please advise.

## 2021-08-08 NOTE — Telephone Encounter (Signed)
He is currently seeing a dermatology for a skin rash and blisters. He is using a topical cream that is helpful for this issue. He is concerned about starting a new medication during his active treatment for his skin condition. He is worried it will make his rash worse. I called his daughter back to let her know it is okay to start the titration pack, if he is comfortable. She said she will encourage him but he may wish to wait until the rash is resolved. She is aware that is okay too.

## 2021-08-09 DIAGNOSIS — L853 Xerosis cutis: Secondary | ICD-10-CM | POA: Diagnosis not present

## 2021-08-09 DIAGNOSIS — L308 Other specified dermatitis: Secondary | ICD-10-CM | POA: Diagnosis not present

## 2021-08-09 DIAGNOSIS — L02821 Furuncle of head [any part, except face]: Secondary | ICD-10-CM | POA: Diagnosis not present

## 2021-08-10 NOTE — Progress Notes (Signed)
Kindly inform the patient that homocystine level is now in normal range and improved from 3 months ago

## 2021-08-11 ENCOUNTER — Telehealth: Payer: Self-pay

## 2021-08-11 DIAGNOSIS — H903 Sensorineural hearing loss, bilateral: Secondary | ICD-10-CM | POA: Diagnosis not present

## 2021-08-11 DIAGNOSIS — H6122 Impacted cerumen, left ear: Secondary | ICD-10-CM | POA: Diagnosis not present

## 2021-08-11 DIAGNOSIS — H7201 Central perforation of tympanic membrane, right ear: Secondary | ICD-10-CM | POA: Diagnosis not present

## 2021-08-11 NOTE — Telephone Encounter (Signed)
-----   Message from Anda Latina, RN sent at 08/11/2021  7:49 AM EST -----  ----- Message ----- From: Garvin Fila, MD Sent: 08/10/2021   4:20 PM EST To: Gna-Pod 2 Results  Kindly inform the patient that homocystine level is now in normal range and improved from 3 months ago

## 2021-08-11 NOTE — Telephone Encounter (Signed)
I called the pt's daughter (POA connie) and advised of results. She verbalized understanding and will update the pt on this information.

## 2021-08-18 DIAGNOSIS — J45909 Unspecified asthma, uncomplicated: Secondary | ICD-10-CM | POA: Diagnosis not present

## 2021-08-18 DIAGNOSIS — Z1389 Encounter for screening for other disorder: Secondary | ICD-10-CM | POA: Diagnosis not present

## 2021-08-18 DIAGNOSIS — D509 Iron deficiency anemia, unspecified: Secondary | ICD-10-CM | POA: Diagnosis not present

## 2021-08-18 DIAGNOSIS — E7800 Pure hypercholesterolemia, unspecified: Secondary | ICD-10-CM | POA: Diagnosis not present

## 2021-08-18 DIAGNOSIS — Z8673 Personal history of transient ischemic attack (TIA), and cerebral infarction without residual deficits: Secondary | ICD-10-CM | POA: Diagnosis not present

## 2021-08-18 DIAGNOSIS — F341 Dysthymic disorder: Secondary | ICD-10-CM | POA: Diagnosis not present

## 2021-08-18 DIAGNOSIS — N1831 Chronic kidney disease, stage 3a: Secondary | ICD-10-CM | POA: Diagnosis not present

## 2021-08-18 DIAGNOSIS — N400 Enlarged prostate without lower urinary tract symptoms: Secondary | ICD-10-CM | POA: Diagnosis not present

## 2021-08-18 DIAGNOSIS — Z8719 Personal history of other diseases of the digestive system: Secondary | ICD-10-CM | POA: Diagnosis not present

## 2021-08-18 DIAGNOSIS — G214 Vascular parkinsonism: Secondary | ICD-10-CM | POA: Diagnosis not present

## 2021-08-18 DIAGNOSIS — G309 Alzheimer's disease, unspecified: Secondary | ICD-10-CM | POA: Diagnosis not present

## 2021-08-18 DIAGNOSIS — R972 Elevated prostate specific antigen [PSA]: Secondary | ICD-10-CM | POA: Diagnosis not present

## 2021-08-18 DIAGNOSIS — Z0000 Encounter for general adult medical examination without abnormal findings: Secondary | ICD-10-CM | POA: Diagnosis not present

## 2021-08-18 DIAGNOSIS — R7303 Prediabetes: Secondary | ICD-10-CM | POA: Diagnosis not present

## 2021-09-26 DIAGNOSIS — H7202 Central perforation of tympanic membrane, left ear: Secondary | ICD-10-CM | POA: Diagnosis not present

## 2021-09-26 DIAGNOSIS — H903 Sensorineural hearing loss, bilateral: Secondary | ICD-10-CM | POA: Diagnosis not present

## 2021-09-26 DIAGNOSIS — H6122 Impacted cerumen, left ear: Secondary | ICD-10-CM | POA: Diagnosis not present

## 2021-10-12 DIAGNOSIS — H353131 Nonexudative age-related macular degeneration, bilateral, early dry stage: Secondary | ICD-10-CM | POA: Diagnosis not present

## 2021-11-05 ENCOUNTER — Other Ambulatory Visit: Payer: Self-pay | Admitting: Neurology

## 2021-11-07 NOTE — Telephone Encounter (Signed)
Duplicate request

## 2021-11-21 ENCOUNTER — Ambulatory Visit
Admission: RE | Admit: 2021-11-21 | Discharge: 2021-11-21 | Disposition: A | Discharge status: Home / Self Care | Payer: Medicare Other | Source: Ambulatory Visit | Attending: Internal Medicine | Admitting: Internal Medicine

## 2021-11-21 ENCOUNTER — Other Ambulatory Visit: Payer: Self-pay | Admitting: Internal Medicine

## 2021-11-21 DIAGNOSIS — R059 Cough, unspecified: Secondary | ICD-10-CM

## 2021-11-22 ENCOUNTER — Ambulatory Visit: Payer: Medicare Other | Admitting: Orthopaedic Surgery

## 2021-11-29 ENCOUNTER — Ambulatory Visit: Payer: Medicare Other | Admitting: Orthopaedic Surgery

## 2021-12-20 ENCOUNTER — Other Ambulatory Visit: Payer: Self-pay | Admitting: Neurology

## 2021-12-20 NOTE — Telephone Encounter (Signed)
I left a VM for patient's daughter, Marlowe Kays (as per [facility]). She had reported a possible rash caused by memantine (Namenda) during the titration period of this medication. I left our office number for a call back to discuss medication.

## 2021-12-20 NOTE — Telephone Encounter (Signed)
Craig Vazquez daughter of pt called back stating that the pt is good to go for a refill. Daughter states the rash had nothing to do with the medication. If any other question call daughter back.

## 2021-12-20 NOTE — Telephone Encounter (Signed)
Rx pended for MD review

## 2021-12-20 NOTE — Telephone Encounter (Signed)
Pt is requesting a refill for memantine (NAMENDA) 10 MG tablet  Pharmacy: [facility] [phone]

## 2021-12-20 NOTE — Addendum Note (Signed)
Addended by: Cristela Felt E on: 12/20/2021 05:17 PM   Modules accepted: Orders

## 2021-12-21 MED ORDER — MEMANTINE HCL 10 MG PO TABS
10.0000 mg | ORAL_TABLET | Freq: Two times a day (BID) | ORAL | 3 refills | Status: DC
Start: 2021-12-21 — End: 2022-05-30

## 2022-01-03 ENCOUNTER — Ambulatory Visit: Payer: Medicare Other | Admitting: Neurology

## 2022-01-09 DIAGNOSIS — D229 Melanocytic nevi, unspecified: Secondary | ICD-10-CM | POA: Diagnosis not present

## 2022-01-09 DIAGNOSIS — L219 Seborrheic dermatitis, unspecified: Secondary | ICD-10-CM | POA: Diagnosis not present

## 2022-01-09 DIAGNOSIS — L298 Other pruritus: Secondary | ICD-10-CM | POA: Diagnosis not present

## 2022-01-09 DIAGNOSIS — L814 Other melanin hyperpigmentation: Secondary | ICD-10-CM | POA: Diagnosis not present

## 2022-01-09 DIAGNOSIS — L821 Other seborrheic keratosis: Secondary | ICD-10-CM | POA: Diagnosis not present

## 2022-01-27 DIAGNOSIS — F0280 Dementia in other diseases classified elsewhere without behavioral disturbance: Secondary | ICD-10-CM | POA: Diagnosis not present

## 2022-01-27 DIAGNOSIS — K219 Gastro-esophageal reflux disease without esophagitis: Secondary | ICD-10-CM | POA: Diagnosis not present

## 2022-01-27 DIAGNOSIS — E782 Mixed hyperlipidemia: Secondary | ICD-10-CM | POA: Diagnosis not present

## 2022-01-27 DIAGNOSIS — Z681 Body mass index (BMI) 19 or less, adult: Secondary | ICD-10-CM | POA: Diagnosis not present

## 2022-01-27 DIAGNOSIS — Z8673 Personal history of transient ischemic attack (TIA), and cerebral infarction without residual deficits: Secondary | ICD-10-CM | POA: Diagnosis not present

## 2022-01-27 DIAGNOSIS — J301 Allergic rhinitis due to pollen: Secondary | ICD-10-CM | POA: Diagnosis not present

## 2022-02-01 ENCOUNTER — Ambulatory Visit: Payer: Medicare Other | Admitting: Neurology

## 2022-02-05 ENCOUNTER — Other Ambulatory Visit: Payer: Self-pay | Admitting: Neurology

## 2022-02-06 DIAGNOSIS — L298 Other pruritus: Secondary | ICD-10-CM | POA: Diagnosis not present

## 2022-02-08 DIAGNOSIS — L298 Other pruritus: Secondary | ICD-10-CM | POA: Diagnosis not present

## 2022-02-08 NOTE — Telephone Encounter (Signed)
Rx refilled.

## 2022-02-21 DIAGNOSIS — L298 Other pruritus: Secondary | ICD-10-CM | POA: Diagnosis not present

## 2022-02-21 DIAGNOSIS — L219 Seborrheic dermatitis, unspecified: Secondary | ICD-10-CM | POA: Diagnosis not present

## 2022-04-21 DIAGNOSIS — Z23 Encounter for immunization: Secondary | ICD-10-CM | POA: Diagnosis not present

## 2022-04-29 ENCOUNTER — Other Ambulatory Visit: Payer: Self-pay | Admitting: Neurology

## 2022-05-16 ENCOUNTER — Other Ambulatory Visit: Payer: Self-pay | Admitting: Neurology

## 2022-05-29 ENCOUNTER — Telehealth: Payer: Self-pay | Admitting: Neurology

## 2022-05-29 NOTE — Telephone Encounter (Signed)
Pt's daughter left a vm stating pt has moved back to [place] and wanted an appointment and to get back on his memantine (NAMENDA TITRATION PAK) tablet pack.  Pt has been scheduled with wait list.  Daughter is asking if pt is able to get a refill.  She states pt does not want to come off of the medication.  If this can be done they are still using [facility] [phone]

## 2022-05-30 MED ORDER — MEMANTINE HCL 10 MG PO TABS: 10.0000 mg | ORAL_TABLET | Freq: Two times a day (BID) | ORAL | 3 refills | Status: DC

## 2022-05-30 NOTE — Addendum Note (Signed)
Addended by: Verlin Grills on: 05/30/2022 09:14 AM   Modules accepted: Orders

## 2022-05-30 NOTE — Telephone Encounter (Addendum)
We will be happy to provide refills for this.  I have sent to [facility] pharmacy.   Please update the pt's daughter. Thank you.

## 2022-05-30 NOTE — Telephone Encounter (Signed)
Message from Tonita Cong, RN re: medication being called in to [facility] pharmacy was just relayed to pt.  This is FYI no call back requested.

## 2022-06-13 DIAGNOSIS — J45909 Unspecified asthma, uncomplicated: Secondary | ICD-10-CM | POA: Diagnosis not present

## 2022-06-13 DIAGNOSIS — N1831 Chronic kidney disease, stage 3a: Secondary | ICD-10-CM | POA: Diagnosis not present

## 2022-06-13 DIAGNOSIS — D509 Iron deficiency anemia, unspecified: Secondary | ICD-10-CM | POA: Diagnosis not present

## 2022-06-13 DIAGNOSIS — G214 Vascular parkinsonism: Secondary | ICD-10-CM | POA: Diagnosis not present

## 2022-06-13 DIAGNOSIS — K279 Peptic ulcer, site unspecified, unspecified as acute or chronic, without hemorrhage or perforation: Secondary | ICD-10-CM | POA: Diagnosis not present

## 2022-06-13 DIAGNOSIS — R7303 Prediabetes: Secondary | ICD-10-CM | POA: Diagnosis not present

## 2022-06-13 DIAGNOSIS — E7800 Pure hypercholesterolemia, unspecified: Secondary | ICD-10-CM | POA: Diagnosis not present

## 2022-06-13 DIAGNOSIS — G309 Alzheimer's disease, unspecified: Secondary | ICD-10-CM | POA: Diagnosis not present

## 2022-06-13 DIAGNOSIS — I69359 Hemiplegia and hemiparesis following cerebral infarction affecting unspecified side: Secondary | ICD-10-CM | POA: Diagnosis not present

## 2022-06-23 DIAGNOSIS — I129 Hypertensive chronic kidney disease with stage 1 through stage 4 chronic kidney disease, or unspecified chronic kidney disease: Secondary | ICD-10-CM | POA: Diagnosis not present

## 2022-06-23 DIAGNOSIS — G214 Vascular parkinsonism: Secondary | ICD-10-CM | POA: Diagnosis not present

## 2022-06-23 DIAGNOSIS — G301 Alzheimer's disease with late onset: Secondary | ICD-10-CM | POA: Diagnosis not present

## 2022-06-23 DIAGNOSIS — F341 Dysthymic disorder: Secondary | ICD-10-CM | POA: Diagnosis not present

## 2022-06-23 DIAGNOSIS — D509 Iron deficiency anemia, unspecified: Secondary | ICD-10-CM | POA: Diagnosis not present

## 2022-06-23 DIAGNOSIS — K5901 Slow transit constipation: Secondary | ICD-10-CM | POA: Diagnosis not present

## 2022-06-23 DIAGNOSIS — N183 Chronic kidney disease, stage 3 unspecified: Secondary | ICD-10-CM | POA: Diagnosis not present

## 2022-06-23 DIAGNOSIS — I69898 Other sequelae of other cerebrovascular disease: Secondary | ICD-10-CM | POA: Diagnosis not present

## 2022-06-23 DIAGNOSIS — F02A0 Dementia in other diseases classified elsewhere, mild, without behavioral disturbance, psychotic disturbance, mood disturbance, and anxiety: Secondary | ICD-10-CM | POA: Diagnosis not present

## 2022-06-23 DIAGNOSIS — J45909 Unspecified asthma, uncomplicated: Secondary | ICD-10-CM | POA: Diagnosis not present

## 2022-06-23 DIAGNOSIS — R269 Unspecified abnormalities of gait and mobility: Secondary | ICD-10-CM | POA: Diagnosis not present

## 2022-06-23 DIAGNOSIS — K259 Gastric ulcer, unspecified as acute or chronic, without hemorrhage or perforation: Secondary | ICD-10-CM | POA: Diagnosis not present

## 2022-06-27 DIAGNOSIS — F331 Major depressive disorder, recurrent, moderate: Secondary | ICD-10-CM | POA: Diagnosis not present

## 2022-06-27 DIAGNOSIS — J45909 Unspecified asthma, uncomplicated: Secondary | ICD-10-CM | POA: Diagnosis not present

## 2022-06-28 DIAGNOSIS — K5901 Slow transit constipation: Secondary | ICD-10-CM | POA: Diagnosis not present

## 2022-06-28 DIAGNOSIS — I129 Hypertensive chronic kidney disease with stage 1 through stage 4 chronic kidney disease, or unspecified chronic kidney disease: Secondary | ICD-10-CM | POA: Diagnosis not present

## 2022-06-28 DIAGNOSIS — K117 Disturbances of salivary secretion: Secondary | ICD-10-CM | POA: Diagnosis not present

## 2022-06-28 DIAGNOSIS — G214 Vascular parkinsonism: Secondary | ICD-10-CM | POA: Diagnosis not present

## 2022-07-06 DIAGNOSIS — F331 Major depressive disorder, recurrent, moderate: Secondary | ICD-10-CM | POA: Diagnosis not present

## 2022-07-12 DIAGNOSIS — F02A0 Dementia in other diseases classified elsewhere, mild, without behavioral disturbance, psychotic disturbance, mood disturbance, and anxiety: Secondary | ICD-10-CM | POA: Diagnosis not present

## 2022-07-12 DIAGNOSIS — G301 Alzheimer's disease with late onset: Secondary | ICD-10-CM | POA: Diagnosis not present

## 2022-07-12 DIAGNOSIS — G214 Vascular parkinsonism: Secondary | ICD-10-CM | POA: Diagnosis not present

## 2022-07-12 DIAGNOSIS — K5901 Slow transit constipation: Secondary | ICD-10-CM | POA: Diagnosis not present

## 2022-07-12 DIAGNOSIS — R269 Unspecified abnormalities of gait and mobility: Secondary | ICD-10-CM | POA: Diagnosis not present

## 2022-07-12 DIAGNOSIS — I679 Cerebrovascular disease, unspecified: Secondary | ICD-10-CM | POA: Diagnosis not present

## 2022-07-12 DIAGNOSIS — F331 Major depressive disorder, recurrent, moderate: Secondary | ICD-10-CM | POA: Diagnosis not present

## 2022-07-19 DIAGNOSIS — H16223 Keratoconjunctivitis sicca, not specified as Sjogren's, bilateral: Secondary | ICD-10-CM | POA: Diagnosis not present

## 2022-07-19 DIAGNOSIS — R42 Dizziness and giddiness: Secondary | ICD-10-CM | POA: Diagnosis not present

## 2022-07-20 DIAGNOSIS — F331 Major depressive disorder, recurrent, moderate: Secondary | ICD-10-CM | POA: Diagnosis not present

## 2022-07-21 DIAGNOSIS — E559 Vitamin D deficiency, unspecified: Secondary | ICD-10-CM | POA: Diagnosis not present

## 2022-07-21 DIAGNOSIS — Z79899 Other long term (current) drug therapy: Secondary | ICD-10-CM | POA: Diagnosis not present

## 2022-07-25 ENCOUNTER — Other Ambulatory Visit: Payer: Self-pay

## 2022-07-25 ENCOUNTER — Encounter: Payer: Self-pay | Admitting: Orthopaedic Surgery

## 2022-07-25 ENCOUNTER — Ambulatory Visit (INDEPENDENT_AMBULATORY_CARE_PROVIDER_SITE_OTHER): Payer: Medicare Other | Admitting: Orthopaedic Surgery

## 2022-07-25 DIAGNOSIS — M1712 Unilateral primary osteoarthritis, left knee: Secondary | ICD-10-CM | POA: Diagnosis not present

## 2022-07-25 MED ORDER — LIDOCAINE HCL 1 % IJ SOLN
2.0000 mL | INTRAMUSCULAR | Status: AC | PRN
  Administered 2022-07-25: 2 mL

## 2022-07-25 MED ORDER — BUPIVACAINE HCL 0.5 % IJ SOLN
2.0000 mL | INTRAMUSCULAR | Status: AC | PRN
  Administered 2022-07-25: 2 mL via INTRA_ARTICULAR

## 2022-07-25 MED ORDER — METHYLPREDNISOLONE ACETATE 40 MG/ML IJ SUSP
40.0000 mg | INTRAMUSCULAR | Status: AC | PRN
  Administered 2022-07-25: 40 mg via INTRA_ARTICULAR

## 2022-07-25 NOTE — Progress Notes (Signed)
Office Visit Note   Patient: Craig Vazquez           Date of Birth: Aug 26, 1930           MRN: [id number] Visit Date: 07/25/2022              Requested by: Wenda Low, MD [address] [place],  [place] [id number] PCP: Wenda Low, MD   Assessment & Plan: Visit Diagnoses:  1. Primary osteoarthritis of left knee     Plan: Left knee injected with cortisone today.  Tolerated this well.  In regards to the swelling we will order Doppler to rule out DVT.  Follow-Up Instructions: No follow-ups on file.   Orders:  Orders Placed This Encounter  Procedures   Large Joint Inj   VAS [place] LOWER EXTREMITY VENOUS (DVT)   No orders of the defined types were placed in this encounter.     Procedures: Large Joint Inj: L knee on 07/25/2022 3:36 PM Details: 22 G needle Medications: 2 mL bupivacaine 0.5 %; 2 mL lidocaine 1 %; 40 mg methylPREDNISolone acetate 40 MG/ML Outcome: tolerated well, no immediate complications Patient was prepped and draped in the usual sterile fashion.       Clinical Data: No additional findings.   Subjective: Chief Complaint  Patient presents with   Left Knee - Pain    HPI Craig Vazquez comes in for recurrent left knee pain due to osteoarthritis.  Requesting another injection.  Also reports has had right foot swelling since he traveled to [place] about 3 months ago. Has a history of stroke. Review of Systems   Objective: Vital Signs: There were no vitals taken for this visit.  Physical Exam  Ortho Exam Examination left knee is a stable. Examination of the right foot shows pitting edema.  He has no calf tenderness.  Negative Homans. Specialty Comments:  No specialty comments available.  Imaging: No results found.   PMFS History: Patient Active Problem List   Diagnosis Date Noted   CAP (community acquired pneumonia) 04/04/2021   Asthma exacerbation 04/04/2021   Sepsis ([place]) 04/04/2021   CKD (chronic kidney disease), stage IIIa  04/04/2021   Stroke ([place]) 04/04/2021   GERD (gastroesophageal reflux disease) 04/04/2021   Acute respiratory failure with hypoxia ([place]) 04/04/2021   Hyponatremia 04/04/2021   Right inguinal hernia 05/11/2020   Primary osteoarthritis of left knee 09/23/2019   Recurrent incarcerated left inguinal hernia s/p open repair 04/06/2018 04/05/2018   Stroke of unknown etiology ([place]) 12/12/2012   Asthma, chronic 11/08/2012   BPPV (benign paroxysmal positional vertigo) 11/08/2012   Expressive aphasia 11/07/2012   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   Past Medical History:  Diagnosis Date   Arthritis 2017   R Knee   Asthma    Diabetes mellitus without complication ([place])    Borderline diabetic   History of hiatal hernia 2019   Hypoglycemia    Recurrent irreducible left inguinal hernia 04/05/2018   Stroke ([place])    5.1.14   Vertigo     Family History  Problem Relation Age of Onset   Cancer Brother        lung ca x 2 brothers    Past Surgical History:  Procedure Laterality Date   DENTAL SURGERY  2008   implants upper and lower   ESOPHAGOGASTRODUODENOSCOPY (EGD) WITH PROPOFOL N/A 08/13/2018   Procedure: ESOPHAGOGASTRODUODENOSCOPY (EGD) WITH PROPOFOL;  Surgeon: Otis Brace, MD;  Location: [facility];  Service: Gastroenterology;  Laterality: N/A;   EYE SURGERY Bilateral  Cataract   HAND SURGERY     rt hand   herina     HERNIA REPAIR  2017   INGUINAL HERNIA REPAIR  04/05/2018   OPEN    INGUINAL HERNIA REPAIR Left 04/05/2018   Procedure: OPEN REPAIR OF LEFT INGUINAL HERNIA;  Surgeon: Fanny Skates, MD;  Location: [facility];  Service: General;  Laterality: Left;   INGUINAL HERNIA REPAIR Right 05/11/2020   Procedure: REPAIR OF RIGHT INGUINAL HERNIA WITH MESH;  Surgeon: Erroll Luna, MD;  Location: [facility];  Service: General;  Laterality: Right;   INSERTION OF MESH Left 04/05/2018   Procedure: INSERTION OF MESH;  Surgeon: Fanny Skates, MD;  Location: [facility];  Service: General;  Laterality:  Left;   INSERTION OF MESH Right 05/11/2020   Procedure: INSERTION OF MESH;  Surgeon: Erroll Luna, MD;  Location: [facility];  Service: General;  Laterality: Right;   LAPAROSCOPIC GASTROTOMY [place] SURGERY  2017   TEE WITHOUT CARDIOVERSION N/A 01/16/2013   Procedure: TRANSESOPHAGEAL ECHOCARDIOGRAM (TEE);  Surgeon: Thayer Headings, MD;  Location[facility];  Service: Cardiovascular;  Laterality: N/A;   Social History   Occupational History   Occupation: retired  Tobacco Use   Smoking status: Former    Types: Cigarettes    Quit date: 1970    Years since quitting: 54.0   Smokeless tobacco: Never   Tobacco comments:    quit 1970  Vaping Use   Vaping Use: Never used  Substance and Sexual Activity   Alcohol use: Yes    Comment: BEER/WINE OCC   Drug use: No   Sexual activity: Not on file

## 2022-07-26 ENCOUNTER — Telehealth: Payer: Self-pay | Admitting: *Deleted

## 2022-07-26 NOTE — Telephone Encounter (Signed)
Pt scheduled at [place] for US DVT tomorrow 07/27/22 at 5pm pt daughter is aware of appt

## 2022-07-27 ENCOUNTER — Telehealth: Payer: Self-pay | Admitting: Orthopaedic Surgery

## 2022-07-27 ENCOUNTER — Ambulatory Visit: Payer: Medicare Other

## 2022-07-27 DIAGNOSIS — E559 Vitamin D deficiency, unspecified: Secondary | ICD-10-CM | POA: Diagnosis not present

## 2022-07-27 DIAGNOSIS — E871 Hypo-osmolality and hyponatremia: Secondary | ICD-10-CM | POA: Diagnosis not present

## 2022-07-27 DIAGNOSIS — R600 Localized edema: Secondary | ICD-10-CM | POA: Diagnosis not present

## 2022-07-27 DIAGNOSIS — N1832 Chronic kidney disease, stage 3b: Secondary | ICD-10-CM | POA: Diagnosis not present

## 2022-07-27 DIAGNOSIS — D509 Iron deficiency anemia, unspecified: Secondary | ICD-10-CM | POA: Diagnosis not present

## 2022-07-27 NOTE — Telephone Encounter (Signed)
Fax the patient order to for doppler scan to [facility] regional radiology [phone] (fax)

## 2022-07-28 DIAGNOSIS — E7849 Other hyperlipidemia: Secondary | ICD-10-CM | POA: Diagnosis not present

## 2022-07-28 DIAGNOSIS — N1832 Chronic kidney disease, stage 3b: Secondary | ICD-10-CM | POA: Diagnosis not present

## 2022-07-28 DIAGNOSIS — R600 Localized edema: Secondary | ICD-10-CM | POA: Diagnosis not present

## 2022-07-28 DIAGNOSIS — J45909 Unspecified asthma, uncomplicated: Secondary | ICD-10-CM | POA: Diagnosis not present

## 2022-07-28 NOTE — Telephone Encounter (Signed)
Order is in EPIC this can be seen by [facility] no need to fax

## 2022-07-31 DIAGNOSIS — R2241 Localized swelling, mass and lump, right lower limb: Secondary | ICD-10-CM | POA: Diagnosis not present

## 2022-08-02 DIAGNOSIS — I679 Cerebrovascular disease, unspecified: Secondary | ICD-10-CM | POA: Diagnosis not present

## 2022-08-02 DIAGNOSIS — M6281 Muscle weakness (generalized): Secondary | ICD-10-CM | POA: Diagnosis not present

## 2022-08-02 DIAGNOSIS — R600 Localized edema: Secondary | ICD-10-CM | POA: Diagnosis not present

## 2022-08-02 DIAGNOSIS — R269 Unspecified abnormalities of gait and mobility: Secondary | ICD-10-CM | POA: Diagnosis not present

## 2022-08-03 ENCOUNTER — Ambulatory Visit: Payer: Medicare Other

## 2022-08-07 DIAGNOSIS — G214 Vascular parkinsonism: Secondary | ICD-10-CM | POA: Diagnosis not present

## 2022-08-07 DIAGNOSIS — G301 Alzheimer's disease with late onset: Secondary | ICD-10-CM | POA: Diagnosis not present

## 2022-08-07 DIAGNOSIS — F02B0 Dementia in other diseases classified elsewhere, moderate, without behavioral disturbance, psychotic disturbance, mood disturbance, and anxiety: Secondary | ICD-10-CM | POA: Diagnosis not present

## 2022-08-08 DIAGNOSIS — G214 Vascular parkinsonism: Secondary | ICD-10-CM | POA: Diagnosis not present

## 2022-08-08 DIAGNOSIS — F341 Dysthymic disorder: Secondary | ICD-10-CM | POA: Diagnosis not present

## 2022-08-08 DIAGNOSIS — D509 Iron deficiency anemia, unspecified: Secondary | ICD-10-CM | POA: Diagnosis not present

## 2022-08-08 DIAGNOSIS — F0150 Vascular dementia without behavioral disturbance: Secondary | ICD-10-CM | POA: Diagnosis not present

## 2022-08-08 DIAGNOSIS — F411 Generalized anxiety disorder: Secondary | ICD-10-CM | POA: Diagnosis not present

## 2022-08-08 DIAGNOSIS — M6281 Muscle weakness (generalized): Secondary | ICD-10-CM | POA: Diagnosis not present

## 2022-08-08 DIAGNOSIS — E7800 Pure hypercholesterolemia, unspecified: Secondary | ICD-10-CM | POA: Diagnosis not present

## 2022-08-08 DIAGNOSIS — N183 Chronic kidney disease, stage 3 unspecified: Secondary | ICD-10-CM | POA: Diagnosis not present

## 2022-08-08 DIAGNOSIS — J45909 Unspecified asthma, uncomplicated: Secondary | ICD-10-CM | POA: Diagnosis not present

## 2022-08-08 DIAGNOSIS — I679 Cerebrovascular disease, unspecified: Secondary | ICD-10-CM | POA: Diagnosis not present

## 2022-08-08 DIAGNOSIS — G309 Alzheimer's disease, unspecified: Secondary | ICD-10-CM | POA: Diagnosis not present

## 2022-08-08 DIAGNOSIS — Z7982 Long term (current) use of aspirin: Secondary | ICD-10-CM | POA: Diagnosis not present

## 2022-08-08 DIAGNOSIS — F0280 Dementia in other diseases classified elsewhere without behavioral disturbance: Secondary | ICD-10-CM | POA: Diagnosis not present

## 2022-08-09 DIAGNOSIS — M6281 Muscle weakness (generalized): Secondary | ICD-10-CM | POA: Diagnosis not present

## 2022-08-09 DIAGNOSIS — G214 Vascular parkinsonism: Secondary | ICD-10-CM | POA: Diagnosis not present

## 2022-08-09 DIAGNOSIS — R269 Unspecified abnormalities of gait and mobility: Secondary | ICD-10-CM | POA: Diagnosis not present

## 2022-08-09 DIAGNOSIS — R600 Localized edema: Secondary | ICD-10-CM | POA: Diagnosis not present

## 2022-08-09 DIAGNOSIS — M170 Bilateral primary osteoarthritis of knee: Secondary | ICD-10-CM | POA: Diagnosis not present

## 2022-08-09 DIAGNOSIS — R300 Dysuria: Secondary | ICD-10-CM | POA: Diagnosis not present

## 2022-08-10 DIAGNOSIS — F0280 Dementia in other diseases classified elsewhere without behavioral disturbance: Secondary | ICD-10-CM | POA: Diagnosis not present

## 2022-08-10 DIAGNOSIS — J45909 Unspecified asthma, uncomplicated: Secondary | ICD-10-CM | POA: Diagnosis not present

## 2022-08-10 DIAGNOSIS — G309 Alzheimer's disease, unspecified: Secondary | ICD-10-CM | POA: Diagnosis not present

## 2022-08-10 DIAGNOSIS — G214 Vascular parkinsonism: Secondary | ICD-10-CM | POA: Diagnosis not present

## 2022-08-10 DIAGNOSIS — I679 Cerebrovascular disease, unspecified: Secondary | ICD-10-CM | POA: Diagnosis not present

## 2022-08-10 DIAGNOSIS — F0150 Vascular dementia without behavioral disturbance: Secondary | ICD-10-CM | POA: Diagnosis not present

## 2022-08-10 DIAGNOSIS — F331 Major depressive disorder, recurrent, moderate: Secondary | ICD-10-CM | POA: Diagnosis not present

## 2022-08-14 ENCOUNTER — Ambulatory Visit (INDEPENDENT_AMBULATORY_CARE_PROVIDER_SITE_OTHER): Payer: Medicare Other | Admitting: Physician Assistant

## 2022-08-14 ENCOUNTER — Encounter: Payer: Self-pay | Admitting: Physician Assistant

## 2022-08-14 DIAGNOSIS — M1712 Unilateral primary osteoarthritis, left knee: Secondary | ICD-10-CM | POA: Diagnosis not present

## 2022-08-14 NOTE — Progress Notes (Signed)
Office Visit Note   Patient: Craig Vazquez           Date of Birth: 1930-09-10           MRN: [id number] Visit Date: 08/14/2022              Requested by: Wenda Low, MD [address] [place],  [place] [address] PCP: Wenda Low, MD   Assessment & Plan: Visit Diagnoses:  1. Unilateral primary osteoarthritis, left knee     Plan: Impression is left knee osteoarthritis and bilateral lower extremity peripheral edema.  In regards to his knee, he unfortunately has not had much relief from viscosupplementation injection or cortisone injection.  I discussed with his daughter trying a course of tramadol but she is afraid that he is already unsteady on his feet and that this would make him worse.  He will try to manage this with NSAIDs for now.  In regards to his bilateral ankle swelling, I have provided him with a compression sock prescription and recommended following up with PCP.  Follow-up with [place] as needed.  Follow-Up Instructions: Return if symptoms worsen or fail to improve.   Orders:  No orders of the defined types were placed in this encounter.  No orders of the defined types were placed in this encounter.     Procedures: No procedures performed   Clinical Data: No additional findings.   Subjective: Chief Complaint  Patient presents with   Left Knee - Pain   Left Ankle - Pain   Right Ankle - Pain    HPI patient is a very pleasant 89-year-old gentleman who comes in today with recurrent left knee pain as well as bilateral ankle swelling.  He is has a history of underlying left knee osteoarthritis.  The pain he has is primarily to the medial knee and is worse with walking.  He has undergone viscosupplementation injections as well as cortisone injections without relief.  Currently taking Tylenol for pain.  In regards to his ankles, he has noticed swelling for the past 6 months.  Right what appears to be worse.  He denies any pain to either ankle.     Review of Systems as detailed in HPI.  All others reviewed and are negative.   Objective: Vital Signs: There were no vitals taken for this visit.  Physical Exam well-developed well-nourished gentleman in no acute distress.  Alert and oriented x 3.  Ortho Exam left knee exam shows range of motion from 0 to 100 degrees.  No effusion.  Mild medial joint line tenderness.  He is neurovascular intact distally.  Bilateral ankle exam shows 2+ pitting edema on the right 1+ pitting edema on the left.  No tenderness along the posterior tibial or peroneal tendons.  No bony tenderness.  Tenderness along the ankle ligaments.  Painless range of motion.  He is neurovascular intact distally.  Specialty Comments:  No specialty comments available.  Imaging: No new imaging   PMFS History: Patient Active Problem List   Diagnosis Date Noted   CAP (community acquired pneumonia) 04/04/2021   Asthma exacerbation 04/04/2021   Sepsis ([place]) 04/04/2021   CKD (chronic kidney disease), stage IIIa 04/04/2021   Stroke ([place]) 04/04/2021   GERD (gastroesophageal reflux disease) 04/04/2021   Acute respiratory failure with hypoxia ([place]) 04/04/2021   Hyponatremia 04/04/2021   Right inguinal hernia 05/11/2020   Primary osteoarthritis of left knee 09/23/2019   Recurrent incarcerated left inguinal hernia s/p open repair 04/06/2018 04/05/2018  Stroke of unknown etiology ([place]) 12/12/2012   Asthma, chronic 11/08/2012   BPPV (benign paroxysmal positional vertigo) 11/08/2012   Expressive aphasia 11/07/2012   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   Past Medical History:  Diagnosis Date   Arthritis 2017   R Knee   Asthma    Diabetes mellitus without complication ([facility])    Borderline diabetic   History of hiatal hernia 2019   Hypoglycemia    Recurrent irreducible left inguinal hernia 04/05/2018   Stroke ([place])    5.1.14   Vertigo     Family History  Problem Relation Age of Onset   Cancer Brother        lung ca x 2 brothers     Past Surgical History:  Procedure Laterality Date   DENTAL SURGERY  2008   implants upper and lower   ESOPHAGOGASTRODUODENOSCOPY (EGD) WITH PROPOFOL N/A 08/13/2018   Procedure: ESOPHAGOGASTRODUODENOSCOPY (EGD) WITH PROPOFOL;  Surgeon: Otis Brace, MD;  Location: [facility];  Service: Gastroenterology;  Laterality: N/A;   EYE SURGERY Bilateral    Cataract   HAND SURGERY     rt hand   herina     HERNIA REPAIR  2017   INGUINAL HERNIA REPAIR  04/05/2018   OPEN    INGUINAL HERNIA REPAIR Left 04/05/2018   Procedure: OPEN REPAIR OF LEFT INGUINAL HERNIA;  Surgeon: Fanny Skates, MD;  Location: [place];  Service: General;  Laterality: Left;   INGUINAL HERNIA REPAIR Right 05/11/2020   Procedure: REPAIR OF RIGHT INGUINAL HERNIA WITH MESH;  Surgeon: Erroll Luna, MD;  Location: [place];  Service: General;  Laterality: Right;   INSERTION OF MESH Left 04/05/2018   Procedure: INSERTION OF MESH;  Surgeon: Fanny Skates, MD;  Location: [place];  Service: General;  Laterality: Left;   INSERTION OF MESH Right 05/11/2020   Procedure: INSERTION OF MESH;  Surgeon: Erroll Luna, MD;  Location: [place];  Service: General;  Laterality: Right;   LAPAROSCOPIC GASTROTOMY [place] SURGERY  2017   TEE WITHOUT CARDIOVERSION N/A 01/16/2013   Procedure: TRANSESOPHAGEAL ECHOCARDIOGRAM (TEE);  Surgeon: Thayer Headings, MD;  Location[facility] ENDOSCOPY;  Service: Cardiovascular;  Laterality: N/A;   Social History   Occupational History   Occupation: retired  Tobacco Use   Smoking status: Former    Types: Cigarettes    Quit date: 1970    Years since quitting: 54.1   Smokeless tobacco: Never   Tobacco comments:    quit 1970  Vaping Use   Vaping Use: Never used  Substance and Sexual Activity   Alcohol use: Yes    Comment: BEER/WINE OCC   Drug use: No   Sexual activity: Not on file

## 2022-08-15 DIAGNOSIS — F0280 Dementia in other diseases classified elsewhere without behavioral disturbance: Secondary | ICD-10-CM | POA: Diagnosis not present

## 2022-08-15 DIAGNOSIS — J45909 Unspecified asthma, uncomplicated: Secondary | ICD-10-CM | POA: Diagnosis not present

## 2022-08-15 DIAGNOSIS — G214 Vascular parkinsonism: Secondary | ICD-10-CM | POA: Diagnosis not present

## 2022-08-15 DIAGNOSIS — F0150 Vascular dementia without behavioral disturbance: Secondary | ICD-10-CM | POA: Diagnosis not present

## 2022-08-15 DIAGNOSIS — G309 Alzheimer's disease, unspecified: Secondary | ICD-10-CM | POA: Diagnosis not present

## 2022-08-15 DIAGNOSIS — I679 Cerebrovascular disease, unspecified: Secondary | ICD-10-CM | POA: Diagnosis not present

## 2022-08-17 ENCOUNTER — Ambulatory Visit: Payer: Medicare Other | Admitting: Orthopaedic Surgery

## 2022-08-17 DIAGNOSIS — I679 Cerebrovascular disease, unspecified: Secondary | ICD-10-CM | POA: Diagnosis not present

## 2022-08-17 DIAGNOSIS — G309 Alzheimer's disease, unspecified: Secondary | ICD-10-CM | POA: Diagnosis not present

## 2022-08-17 DIAGNOSIS — F0280 Dementia in other diseases classified elsewhere without behavioral disturbance: Secondary | ICD-10-CM | POA: Diagnosis not present

## 2022-08-17 DIAGNOSIS — F0150 Vascular dementia without behavioral disturbance: Secondary | ICD-10-CM | POA: Diagnosis not present

## 2022-08-17 DIAGNOSIS — G214 Vascular parkinsonism: Secondary | ICD-10-CM | POA: Diagnosis not present

## 2022-08-17 DIAGNOSIS — J45909 Unspecified asthma, uncomplicated: Secondary | ICD-10-CM | POA: Diagnosis not present

## 2022-08-22 ENCOUNTER — Ambulatory Visit: Payer: Medicare Other | Admitting: Orthopaedic Surgery

## 2022-08-22 DIAGNOSIS — G214 Vascular parkinsonism: Secondary | ICD-10-CM | POA: Diagnosis not present

## 2022-08-22 DIAGNOSIS — G309 Alzheimer's disease, unspecified: Secondary | ICD-10-CM | POA: Diagnosis not present

## 2022-08-22 DIAGNOSIS — I679 Cerebrovascular disease, unspecified: Secondary | ICD-10-CM | POA: Diagnosis not present

## 2022-08-22 DIAGNOSIS — F0150 Vascular dementia without behavioral disturbance: Secondary | ICD-10-CM | POA: Diagnosis not present

## 2022-08-22 DIAGNOSIS — J45909 Unspecified asthma, uncomplicated: Secondary | ICD-10-CM | POA: Diagnosis not present

## 2022-08-22 DIAGNOSIS — F0280 Dementia in other diseases classified elsewhere without behavioral disturbance: Secondary | ICD-10-CM | POA: Diagnosis not present

## 2022-08-23 DIAGNOSIS — L853 Xerosis cutis: Secondary | ICD-10-CM | POA: Diagnosis not present

## 2022-08-23 DIAGNOSIS — D225 Melanocytic nevi of trunk: Secondary | ICD-10-CM | POA: Diagnosis not present

## 2022-08-23 DIAGNOSIS — L0101 Non-bullous impetigo: Secondary | ICD-10-CM | POA: Diagnosis not present

## 2022-08-23 DIAGNOSIS — M720 Palmar fascial fibromatosis [Dupuytren]: Secondary | ICD-10-CM | POA: Diagnosis not present

## 2022-08-23 DIAGNOSIS — L821 Other seborrheic keratosis: Secondary | ICD-10-CM | POA: Diagnosis not present

## 2022-08-23 DIAGNOSIS — L814 Other melanin hyperpigmentation: Secondary | ICD-10-CM | POA: Diagnosis not present

## 2022-08-24 DIAGNOSIS — G309 Alzheimer's disease, unspecified: Secondary | ICD-10-CM | POA: Diagnosis not present

## 2022-08-24 DIAGNOSIS — F0150 Vascular dementia without behavioral disturbance: Secondary | ICD-10-CM | POA: Diagnosis not present

## 2022-08-24 DIAGNOSIS — I679 Cerebrovascular disease, unspecified: Secondary | ICD-10-CM | POA: Diagnosis not present

## 2022-08-24 DIAGNOSIS — F0280 Dementia in other diseases classified elsewhere without behavioral disturbance: Secondary | ICD-10-CM | POA: Diagnosis not present

## 2022-08-24 DIAGNOSIS — G214 Vascular parkinsonism: Secondary | ICD-10-CM | POA: Diagnosis not present

## 2022-08-24 DIAGNOSIS — J45909 Unspecified asthma, uncomplicated: Secondary | ICD-10-CM | POA: Diagnosis not present

## 2022-08-24 DIAGNOSIS — H353131 Nonexudative age-related macular degeneration, bilateral, early dry stage: Secondary | ICD-10-CM | POA: Diagnosis not present

## 2022-08-29 DIAGNOSIS — I679 Cerebrovascular disease, unspecified: Secondary | ICD-10-CM | POA: Diagnosis not present

## 2022-08-29 DIAGNOSIS — J45909 Unspecified asthma, uncomplicated: Secondary | ICD-10-CM | POA: Diagnosis not present

## 2022-08-29 DIAGNOSIS — G214 Vascular parkinsonism: Secondary | ICD-10-CM | POA: Diagnosis not present

## 2022-08-29 DIAGNOSIS — F0150 Vascular dementia without behavioral disturbance: Secondary | ICD-10-CM | POA: Diagnosis not present

## 2022-08-29 DIAGNOSIS — G309 Alzheimer's disease, unspecified: Secondary | ICD-10-CM | POA: Diagnosis not present

## 2022-08-29 DIAGNOSIS — F0280 Dementia in other diseases classified elsewhere without behavioral disturbance: Secondary | ICD-10-CM | POA: Diagnosis not present

## 2022-08-30 ENCOUNTER — Telehealth: Payer: Self-pay | Admitting: Orthopedic Surgery

## 2022-08-30 NOTE — Telephone Encounter (Signed)
Mr. Ribelin daughter called in to make her father an appointment for gel injections in his knees.  I looked at his past visit, he had a cortisone injection in January and followed up with Ria Comment in Feb.  At that visit he said he did not get much relief from the injection.  Looks like the last gel was given in 2022.  Marlowe Kays states that he has decided that he would like to try the gel again.  I advised that Dr. Erlinda Hong would need to order the injection and then it would need to go through a gel injection approval process with insurance.  She is asking that we go ahead and start the process and call her when we can schedule the appointment.

## 2022-08-30 NOTE — Telephone Encounter (Signed)
yes

## 2022-08-31 DIAGNOSIS — G214 Vascular parkinsonism: Secondary | ICD-10-CM | POA: Diagnosis not present

## 2022-08-31 DIAGNOSIS — G309 Alzheimer's disease, unspecified: Secondary | ICD-10-CM | POA: Diagnosis not present

## 2022-08-31 DIAGNOSIS — F0150 Vascular dementia without behavioral disturbance: Secondary | ICD-10-CM | POA: Diagnosis not present

## 2022-08-31 DIAGNOSIS — I679 Cerebrovascular disease, unspecified: Secondary | ICD-10-CM | POA: Diagnosis not present

## 2022-08-31 DIAGNOSIS — F0280 Dementia in other diseases classified elsewhere without behavioral disturbance: Secondary | ICD-10-CM | POA: Diagnosis not present

## 2022-08-31 DIAGNOSIS — J45909 Unspecified asthma, uncomplicated: Secondary | ICD-10-CM | POA: Diagnosis not present

## 2022-08-31 NOTE — Telephone Encounter (Signed)
VOB submitted for Monovisc, left knee.

## 2022-09-05 DIAGNOSIS — G214 Vascular parkinsonism: Secondary | ICD-10-CM | POA: Diagnosis not present

## 2022-09-05 DIAGNOSIS — F0280 Dementia in other diseases classified elsewhere without behavioral disturbance: Secondary | ICD-10-CM | POA: Diagnosis not present

## 2022-09-05 DIAGNOSIS — I679 Cerebrovascular disease, unspecified: Secondary | ICD-10-CM | POA: Diagnosis not present

## 2022-09-05 DIAGNOSIS — J45909 Unspecified asthma, uncomplicated: Secondary | ICD-10-CM | POA: Diagnosis not present

## 2022-09-05 DIAGNOSIS — F0150 Vascular dementia without behavioral disturbance: Secondary | ICD-10-CM | POA: Diagnosis not present

## 2022-09-05 DIAGNOSIS — G309 Alzheimer's disease, unspecified: Secondary | ICD-10-CM | POA: Diagnosis not present

## 2022-09-06 DIAGNOSIS — H04123 Dry eye syndrome of bilateral lacrimal glands: Secondary | ICD-10-CM | POA: Diagnosis not present

## 2022-09-06 DIAGNOSIS — R269 Unspecified abnormalities of gait and mobility: Secondary | ICD-10-CM | POA: Diagnosis not present

## 2022-09-06 DIAGNOSIS — F01A0 Vascular dementia, mild, without behavioral disturbance, psychotic disturbance, mood disturbance, and anxiety: Secondary | ICD-10-CM | POA: Diagnosis not present

## 2022-09-06 DIAGNOSIS — I679 Cerebrovascular disease, unspecified: Secondary | ICD-10-CM | POA: Diagnosis not present

## 2022-09-06 DIAGNOSIS — E162 Hypoglycemia, unspecified: Secondary | ICD-10-CM | POA: Diagnosis not present

## 2022-09-06 DIAGNOSIS — G214 Vascular parkinsonism: Secondary | ICD-10-CM | POA: Diagnosis not present

## 2022-09-06 DIAGNOSIS — F411 Generalized anxiety disorder: Secondary | ICD-10-CM | POA: Diagnosis not present

## 2022-09-06 DIAGNOSIS — K5901 Slow transit constipation: Secondary | ICD-10-CM | POA: Diagnosis not present

## 2022-09-06 DIAGNOSIS — H353130 Nonexudative age-related macular degeneration, bilateral, stage unspecified: Secondary | ICD-10-CM | POA: Diagnosis not present

## 2022-09-07 DIAGNOSIS — N183 Chronic kidney disease, stage 3 unspecified: Secondary | ICD-10-CM | POA: Diagnosis not present

## 2022-09-07 DIAGNOSIS — Z7982 Long term (current) use of aspirin: Secondary | ICD-10-CM | POA: Diagnosis not present

## 2022-09-07 DIAGNOSIS — J45909 Unspecified asthma, uncomplicated: Secondary | ICD-10-CM | POA: Diagnosis not present

## 2022-09-07 DIAGNOSIS — F411 Generalized anxiety disorder: Secondary | ICD-10-CM | POA: Diagnosis not present

## 2022-09-07 DIAGNOSIS — G214 Vascular parkinsonism: Secondary | ICD-10-CM | POA: Diagnosis not present

## 2022-09-07 DIAGNOSIS — G309 Alzheimer's disease, unspecified: Secondary | ICD-10-CM | POA: Diagnosis not present

## 2022-09-07 DIAGNOSIS — F0150 Vascular dementia without behavioral disturbance: Secondary | ICD-10-CM | POA: Diagnosis not present

## 2022-09-07 DIAGNOSIS — D509 Iron deficiency anemia, unspecified: Secondary | ICD-10-CM | POA: Diagnosis not present

## 2022-09-07 DIAGNOSIS — F0280 Dementia in other diseases classified elsewhere without behavioral disturbance: Secondary | ICD-10-CM | POA: Diagnosis not present

## 2022-09-07 DIAGNOSIS — F341 Dysthymic disorder: Secondary | ICD-10-CM | POA: Diagnosis not present

## 2022-09-07 DIAGNOSIS — M6281 Muscle weakness (generalized): Secondary | ICD-10-CM | POA: Diagnosis not present

## 2022-09-07 DIAGNOSIS — E441 Mild protein-calorie malnutrition: Secondary | ICD-10-CM | POA: Diagnosis not present

## 2022-09-07 DIAGNOSIS — I679 Cerebrovascular disease, unspecified: Secondary | ICD-10-CM | POA: Diagnosis not present

## 2022-09-12 DIAGNOSIS — G309 Alzheimer's disease, unspecified: Secondary | ICD-10-CM | POA: Diagnosis not present

## 2022-09-12 DIAGNOSIS — J45909 Unspecified asthma, uncomplicated: Secondary | ICD-10-CM | POA: Diagnosis not present

## 2022-09-12 DIAGNOSIS — F0280 Dementia in other diseases classified elsewhere without behavioral disturbance: Secondary | ICD-10-CM | POA: Diagnosis not present

## 2022-09-12 DIAGNOSIS — I679 Cerebrovascular disease, unspecified: Secondary | ICD-10-CM | POA: Diagnosis not present

## 2022-09-12 DIAGNOSIS — G214 Vascular parkinsonism: Secondary | ICD-10-CM | POA: Diagnosis not present

## 2022-09-12 DIAGNOSIS — F0150 Vascular dementia without behavioral disturbance: Secondary | ICD-10-CM | POA: Diagnosis not present

## 2022-09-13 ENCOUNTER — Telehealth: Payer: Self-pay

## 2022-09-13 DIAGNOSIS — M1712 Unilateral primary osteoarthritis, left knee: Secondary | ICD-10-CM

## 2022-09-13 NOTE — Telephone Encounter (Signed)
Called and left a VM advising Craig Vazquez that patient is approved for gel injection, but can get a cortisone if he prefers.    See referrals tab

## 2022-09-14 DIAGNOSIS — R4701 Aphasia: Secondary | ICD-10-CM | POA: Diagnosis not present

## 2022-09-14 DIAGNOSIS — G214 Vascular parkinsonism: Secondary | ICD-10-CM | POA: Diagnosis not present

## 2022-09-14 DIAGNOSIS — K279 Peptic ulcer, site unspecified, unspecified as acute or chronic, without hemorrhage or perforation: Secondary | ICD-10-CM | POA: Diagnosis not present

## 2022-09-14 DIAGNOSIS — I69359 Hemiplegia and hemiparesis following cerebral infarction affecting unspecified side: Secondary | ICD-10-CM | POA: Diagnosis not present

## 2022-09-14 DIAGNOSIS — J45909 Unspecified asthma, uncomplicated: Secondary | ICD-10-CM | POA: Diagnosis not present

## 2022-09-14 DIAGNOSIS — E46 Unspecified protein-calorie malnutrition: Secondary | ICD-10-CM | POA: Diagnosis not present

## 2022-09-14 DIAGNOSIS — R7303 Prediabetes: Secondary | ICD-10-CM | POA: Diagnosis not present

## 2022-09-14 DIAGNOSIS — L989 Disorder of the skin and subcutaneous tissue, unspecified: Secondary | ICD-10-CM | POA: Diagnosis not present

## 2022-09-14 DIAGNOSIS — G309 Alzheimer's disease, unspecified: Secondary | ICD-10-CM | POA: Diagnosis not present

## 2022-09-14 DIAGNOSIS — E785 Hyperlipidemia, unspecified: Secondary | ICD-10-CM | POA: Diagnosis not present

## 2022-09-14 DIAGNOSIS — F341 Dysthymic disorder: Secondary | ICD-10-CM | POA: Diagnosis not present

## 2022-09-14 DIAGNOSIS — Z1331 Encounter for screening for depression: Secondary | ICD-10-CM | POA: Diagnosis not present

## 2022-09-14 DIAGNOSIS — Z0000 Encounter for general adult medical examination without abnormal findings: Secondary | ICD-10-CM | POA: Diagnosis not present

## 2022-09-19 DIAGNOSIS — G214 Vascular parkinsonism: Secondary | ICD-10-CM | POA: Diagnosis not present

## 2022-09-19 DIAGNOSIS — F0280 Dementia in other diseases classified elsewhere without behavioral disturbance: Secondary | ICD-10-CM | POA: Diagnosis not present

## 2022-09-19 DIAGNOSIS — G309 Alzheimer's disease, unspecified: Secondary | ICD-10-CM | POA: Diagnosis not present

## 2022-09-19 DIAGNOSIS — L0101 Non-bullous impetigo: Secondary | ICD-10-CM | POA: Diagnosis not present

## 2022-09-19 DIAGNOSIS — F0150 Vascular dementia without behavioral disturbance: Secondary | ICD-10-CM | POA: Diagnosis not present

## 2022-09-19 DIAGNOSIS — J45909 Unspecified asthma, uncomplicated: Secondary | ICD-10-CM | POA: Diagnosis not present

## 2022-09-19 DIAGNOSIS — L218 Other seborrheic dermatitis: Secondary | ICD-10-CM | POA: Diagnosis not present

## 2022-09-19 DIAGNOSIS — I679 Cerebrovascular disease, unspecified: Secondary | ICD-10-CM | POA: Diagnosis not present

## 2022-09-20 DIAGNOSIS — E441 Mild protein-calorie malnutrition: Secondary | ICD-10-CM | POA: Diagnosis not present

## 2022-09-20 DIAGNOSIS — L89322 Pressure ulcer of left buttock, stage 2: Secondary | ICD-10-CM | POA: Diagnosis not present

## 2022-09-20 DIAGNOSIS — R269 Unspecified abnormalities of gait and mobility: Secondary | ICD-10-CM | POA: Diagnosis not present

## 2022-09-25 DIAGNOSIS — G214 Vascular parkinsonism: Secondary | ICD-10-CM | POA: Diagnosis not present

## 2022-09-25 DIAGNOSIS — F0150 Vascular dementia without behavioral disturbance: Secondary | ICD-10-CM | POA: Diagnosis not present

## 2022-09-25 DIAGNOSIS — J45909 Unspecified asthma, uncomplicated: Secondary | ICD-10-CM | POA: Diagnosis not present

## 2022-09-25 DIAGNOSIS — G309 Alzheimer's disease, unspecified: Secondary | ICD-10-CM | POA: Diagnosis not present

## 2022-09-25 DIAGNOSIS — F0280 Dementia in other diseases classified elsewhere without behavioral disturbance: Secondary | ICD-10-CM | POA: Diagnosis not present

## 2022-09-25 DIAGNOSIS — I679 Cerebrovascular disease, unspecified: Secondary | ICD-10-CM | POA: Diagnosis not present

## 2022-09-28 ENCOUNTER — Ambulatory Visit (INDEPENDENT_AMBULATORY_CARE_PROVIDER_SITE_OTHER): Payer: Medicare Other | Admitting: Neurology

## 2022-09-28 ENCOUNTER — Encounter: Payer: Self-pay | Admitting: Neurology

## 2022-09-28 VITALS — BP 148/70 | HR 79 | Ht 68.0 in | Wt 134.2 lb

## 2022-09-28 DIAGNOSIS — G214 Vascular parkinsonism: Secondary | ICD-10-CM | POA: Diagnosis not present

## 2022-09-28 DIAGNOSIS — F0280 Dementia in other diseases classified elsewhere without behavioral disturbance: Secondary | ICD-10-CM | POA: Diagnosis not present

## 2022-09-28 DIAGNOSIS — I679 Cerebrovascular disease, unspecified: Secondary | ICD-10-CM | POA: Diagnosis not present

## 2022-09-28 DIAGNOSIS — G309 Alzheimer's disease, unspecified: Secondary | ICD-10-CM | POA: Diagnosis not present

## 2022-09-28 DIAGNOSIS — F0150 Vascular dementia without behavioral disturbance: Secondary | ICD-10-CM

## 2022-09-28 DIAGNOSIS — J45909 Unspecified asthma, uncomplicated: Secondary | ICD-10-CM | POA: Diagnosis not present

## 2022-09-28 NOTE — Patient Instructions (Signed)
I had a long discussion the patient and his daughter regarding his memory loss and mild dementia and  answered questions.  Recommend continue folic acid for elevated homocystine continue Namenda  10 mg twice daily as he appears cognitively stable testing today..  Patient encouraged to increase participation in cognitively challenging activities like solving crossword puzzles, playing bridge and sodoku and we discussed memory compensation strategies.  He will continue on aspirin for stroke prevention and maintain aggressive risk factor modification strict control of hypertension with blood pressure goal below 130/90 and lipids with LDL cholesterol goal below 70 mg percent.  He was encouraged to use a wheeled walker at all times and to get up and walk slowly.  His Parkinson symptoms appear mild and nondisabling hence we will hold off on medications at the present time.  Return for follow-up in the future in 1 year or call earlier if necessary.

## 2022-09-28 NOTE — Progress Notes (Signed)
[facility] [address] [place]. [place] [address] [phone]       OFFICE FOLLOW UP VISIT NOTE  Craig Vazquez Date of Birth:  07-01-1931 Medical Record Number:  [id number]   Referring MD: Benita Stabile  Reason for Referral: Aphasia  HPI:  Update 09/28/2022 : He returns for follow-up after last visit more than a year ago.  He is accompanied by his daughter.  He is tolerating Namenda well appears to be cognitively stable.  Still has short-term memory difficulties.  He has recently moved to assisted living few months ago.  He ambulates with a wheeled walker.  He has not had any falls or injuries.  Continues to have mild drooling but this is not bothersome.  There has been slight increase in his tremors in the hands right more than left but there nondisabling the patient and daughter are reluctant to consider parkinsonian medication due to side effects.  Needs some help with giving his medicine and dressing that he can bathe himself and ambulate clinical mild spasms in his leg and trouble sleeping on Mini-Mental status testing today scored 24/30 with slight improvement from last visit 22/30.  He has not had any recurrent stroke or TIA symptoms.  He remains on aspirin tolerating well without blood pressure is well-controlled and today it is 148/70.  Homocystine Level had improved from 24.8 to 18.6 on [id number] on folic acid 1 mg daily  Update 08/04/2021 : He returns for follow-up after last visit 3 months ago.  Is accompanied by his daughter.  Patient was able to tolerate the Sinemet but did not notice significant improvement in his voice or drooling and hence he stopped it when he ran out of the prescription.  He continues to have some drooling and soft voice but it is unchanged and not getting worse.  He denies any tremor or significant bradykinesia.  Patient continues to have memory difficulties which daughter feels may be slightly worse.  He also is occasionally stops in  mid sentences and searches for words.  On the Mini-Mental status exam today he scored 22/30 which was slightly declined from 24/30 at the last visit.  Patient had MRI scan of the brain done on 05/01/2021 which showed moderate generalized atrophy and mild changes of age-related small vessel disease.  MRI of the brain showed no significant large vessel stenosis or occlusion.  Lab work on 04/13/2021 showed normal vitamin B12, TSH and RPR.  Homocystine was slightly elevated on 24.8.  Patient has been started on folic acid 1 mg daily which he has been taking regularly.  Hemoglobin A1c was 6.3 and LDL cholesterol was 105 mg percent.  He has no other new complaints.  He continues to live and is mostly independent and needs only mild supervision. office visit 04/13/2021 Craig Vazquez is a 89 year old pleasant Caucasian male seen today for initial office consultation visit.  He is accompanied by his daughter today.  History is obtained from them and review of electronic medical records and I personally reviewed pertinent available imaging films in PACS.  He has past medical history of asthma, chronic kidney disease, gastroesophageal flux disease, stroke in 2014 and recent admission on 04/04/2021 for respiratory failure due to community-acquired pneumonia and was treated with IV antibiotics..  Patient and daughter say that for a year or so they have noticed some difficulty with speech.  His voice is soft and he has trouble finding words at times he stops in mid sentences unable to speak sentences  and has to think about the word.  He also admits to worsening short-term memory and cognitive difficulties.  He lives with his daughter.  He gets occasionally frustrated and agitated but there has not been any violent behavior.  He denies any delusions, hallucinations, nightmares or difficulty sleeping.  He is also noticed increased drooling of saliva from his mouth.  He has some trouble getting up but once he is up he can walk fairly  well.  He denies stooped posture, gait festination or freezing while walking.  He has also noticed tremors in both hands but they are absent at rest and noticed only when he is using his hands.  He has not had any recent brain imaging studies done.  MRI scan of the brain on 11/07/2012 had shown innumerable punctate foci of acute infarcts throughout the cerebral hemispheres due to embolic source of cryptogenic etiology.  He was followed in the office and was last seen on 05/17/2015.  He was noted as having mild word finding difficulties and cognitive impairment at that visit as well.  Last Carotid ultrasound in the electronic medical records is from 07/14/2016 which shows no significant bilateral extracranial stenosis.  2D echo 04/05/2021 shows ejection fraction 60 to 65% without cardiac source of embolism.  Patient has had any work-up for reversible causes of memory loss or brain imaging studies or tried any medications to improve his memory. UPDATE 04/01/13 (LL): Patient returns for 4 month stroke revisit. He has done well, no residual neurological deficits. 30 day cardiac monitor was negative. Outpatient TEE showed a small PFO. His BP is well controlled he states, though it is elevated in office today, 150/70. He states he has his annual visit coming up with his PCP. He states he stays active, working in his yard. He is retired Social research officer, government. He is tolerating daily aspirin 325 mg, with No bleeding or excessive bruising.    UPDATE 11/12/13 (LL): Craig Vazquez returns for routine stroke follow up.  Still doing well, only very mild expressive aphasia when tired.  No new neurovascular symptoms.  His blood pressure remains well controlled without medication.  BP in office today is 107/62.  He is tolerating aspirin well with no signs of significant bleeding or bruising. UPDATE 05/20/2014 (PS) : he returns for follow-up after last visit 6 months ago. He is doing well without recurrent stroke or TIA symptoms. He continues to have  mild word finding difficulties particularly when tired but states his memory is good. He is independent in activities of daily living. He has not had any recurrent neurovascular symptoms. He is tolerating aspirin well without any bleeding or bruising.His blood pressure is under good control He has no new complaints UPDATE 05/17/15  Craig Vazquez , 89 year old male returns for follow-up. He had a stroke 11/07/2012.  MRI that time showed multiple small areas of stroke likely a cryptogenic embolic source. He has been maintained on aspirin 325 mg daily without further stroke or TIA symptoms.  Carotid Doppler after last visit negative for significant stenosis. Patient given a copy. B/P in the office today 128/60. He continues to be very active , reports that his memory is good. Appetite is good he is sleeping well. He returns for reevaluation ROS:   14 system review of systems is positive for drooling, speech difficulties, tremors, shortness of breath, gait difficulty all other systems negative  PMH:  Past Medical History:  Diagnosis Date   Arthritis 2017   R Knee   Asthma  Diabetes mellitus without complication (HCC)    Borderline diabetic   History of hiatal hernia 2019   Hypoglycemia    Recurrent irreducible left inguinal hernia 04/05/2018   Stroke ([place])    5.1.14   Vertigo     Social History:  Social History   Socioeconomic History   Marital status: Married    Spouse name: Not on file   Number of children: 2   Years of education: DOCTRATE   Highest education level: Not on file  Occupational History   Occupation: retired  Tobacco Use   Smoking status: Former    Types: Cigarettes    Quit date: 1970    Years since quitting: 54.2   Smokeless tobacco: Never   Tobacco comments:    quit 1970  Vaping Use   Vaping Use: Never used  Substance and Sexual Activity   Alcohol use: Yes    Comment: BEER/WINE OCC   Drug use: No   Sexual activity: Not on file  Other Topics Concern   Not on  file  Social History Narrative   Lives with daughter, Marlowe Kays ([place])   Right Handed   Drinks 4-5 cups caffeine daily   Social Determinants of Health   Financial Resource Strain: Not on file  Food Insecurity: Not on file  Transportation Needs: Not on file  Physical Activity: Not on file  Stress: Not on file  Social Connections: Not on file  Intimate Partner Violence: Not on file    Medications:   Current Outpatient Medications on File Prior to Visit  Medication Sig Dispense Refill   aspirin 325 MG tablet Take 1 tablet (325 mg total) by mouth daily. 1 tablet 1   atorvastatin (LIPITOR) 40 MG tablet TAKE 1 TABLET(40 MG) BY MOUTH DAILY 90 tablet 1   fluticasone (FLONASE) 50 MCG/ACT nasal spray Place 1 spray into the nose daily as needed for allergies.      Fluticasone Propionate, Inhal, 100 MCG/BLIST AEPB Inhale 1 Inhaler into the lungs 2 (two) times daily as needed (for respiratory issues.).      folic acid (FOLVITE) 1 MG tablet TAKE 1 TABLET(1 MG) BY MOUTH DAILY 90 tablet 1   hydrOXYzine (ATARAX/VISTARIL) 10 MG tablet Take 1 tablet (10 mg total) by mouth 2 (two) times daily as needed (home med). 30 tablet 0   memantine (NAMENDA TITRATION PAK) tablet pack 5 mg/day for =1 week; 5 mg twice daily for =1 week; 15 mg/day given in 5 mg and 10 mg separated doses for =1 week; then 10 mg twice daily 49 tablet 12   memantine (NAMENDA) 10 MG tablet Take 1 tablet (10 mg total) by mouth 2 (two) times daily. 60 tablet 3   pantoprazole (PROTONIX) 40 MG tablet Take 40 mg by mouth daily before breakfast.     triamcinolone cream (KENALOG) 0.1 % Apply 1 application topically daily as needed (dry skin).      No current facility-administered medications on file prior to visit.    Allergies:  No Known Allergies  Physical Exam General: Frail elderly Caucasian male, seated, in no evident distress Head: head normocephalic and atraumatic.   Neck: supple with soft left carotid  bruit Cardiovascular: regular  rate and rhythm, no murmurs Musculoskeletal: Mild kyphoscoliosis Skin:  no rash/petichiae Vascular:  Normal pulses all extremities  Neurologic Exam Mental Status: Awake and fully alert. Oriented to place and time. Recent and remote memory intact. Attention span, concentration and fund of knowledge appropriate. Mood and affect appropriate.  Mini-Mental status  exam score 24/30 ( last visit 22/30 )with deficits in orientation, attention calculation and recall.  He had difficulty with copying intersecting pentagons.  Clock drawing score was 3/4.  He was able to name only 8 animals which can walk on 4 legs.  Glabellar tap is positive.  Voice is hypophonic without dysarthria or aphasia.  Palmomental reflex is negative.  Geriatric depression scale not depressed Cranial Nerves: Fundoscopic exam not done. Pupils equal, briskly reactive to light. Extraocular movements full without nystagmus. Visual fields full to confrontation. Hearing mildly diminished bilaterally.. Facial sensation intact.  Decreased facial expression., tongue, palate moves normally and symmetrically.  Motor: Normal bulk and tone. Normal strength in all tested extremity muscles.  Absent resting tremor.  Mild fine action tremor of outstretched upper extremities.  Tremor increases with action.  Mild cogwheel rigidity at right wrist upon activation only. Sensory.: intact to touch , pinprick , position and vibratory sensation.  Coordination: Rapid alternating movements normal in all extremities. Finger-to-nose and heel-to-shin performed accurately bilaterally. Gait and Station: Arises from chair with slight difficulty with his arms folded across the chest.  Diminished right arm swing when he walks.. Stance is stooped.  Mild  festination  and  short steps.  Gait demonstrates normal stride length .mild imbalance when he turns or stands on a narrow base.  Poor postural response to threat..  Not able to heel, toe and tandem walk without difficulty.   Reflexes: 1+ and symmetric. Toes downgoing.        09/28/2022    1:12 PM 08/04/2021    3:39 PM 04/13/2021   11:54 AM  MMSE - Mini Mental State Exam  Orientation to time 5 5 4   Orientation to Place 5 5 5   Registration 3 3 3   Attention/ Calculation 4 0 1  Recall 0 0 2  Language- name 2 objects 2 2 2   Language- repeat 1 1 1   Language- follow 3 step command 3 3 3   Language- read & follow direction 1 1 1   Write a sentence 0 1 1  Copy design 0 1 1  Total score 24 22 24       ASSESSMENT: 89 year old Caucasian male with subacute speech difficulties and drooling likely from parkinsonism etiology likely vascular given prior history of strokes who appears stable on the current regimen.[name]  History of bicerebral embolic strokes of cryptogenic etiology in 2014 with residual mild cognitive impairment.       PLAN: I had a long discussion the patient and his daughter regarding his memory loss and mild dementia and  answered questions.  Recommend continue folic acid for elevated homocystine continue Namenda  10 mg twice daily as he appears cognitively stable testing today..  Patient encouraged to increase participation in cognitively challenging activities like solving crossword puzzles, playing bridge and sodoku and we discussed memory compensation strategies.  He will continue on aspirin for stroke prevention and maintain aggressive risk factor modification strict control of hypertension with blood pressure goal below 130/90 and lipids with LDL cholesterol goal below 70 mg percent.  He was encouraged to use a wheeled walker at all times and to get up and walk slowly.  His Parkinson symptoms appear mild and nondisabling hence we will hold off on medications at the present time.  Return for follow-up in the future in 1 year or call earlier if necessary.Greater than 50% time during this 35-minute   visit was spent on counseling and coordination of care about his speech difficulties and previous stroke and  for  stroke, dementia, parkinsonism and answering questions.  Antony Contras MD Note: This document was prepared with digital dictation and possible smart phrase technology. Any transcriptional errors that result from this process are unintentional.

## 2022-10-02 DIAGNOSIS — I679 Cerebrovascular disease, unspecified: Secondary | ICD-10-CM | POA: Diagnosis not present

## 2022-10-02 DIAGNOSIS — G309 Alzheimer's disease, unspecified: Secondary | ICD-10-CM | POA: Diagnosis not present

## 2022-10-02 DIAGNOSIS — G214 Vascular parkinsonism: Secondary | ICD-10-CM | POA: Diagnosis not present

## 2022-10-02 DIAGNOSIS — J45909 Unspecified asthma, uncomplicated: Secondary | ICD-10-CM | POA: Diagnosis not present

## 2022-10-02 DIAGNOSIS — F0150 Vascular dementia without behavioral disturbance: Secondary | ICD-10-CM | POA: Diagnosis not present

## 2022-10-02 DIAGNOSIS — F0280 Dementia in other diseases classified elsewhere without behavioral disturbance: Secondary | ICD-10-CM | POA: Diagnosis not present

## 2022-10-03 DIAGNOSIS — G309 Alzheimer's disease, unspecified: Secondary | ICD-10-CM | POA: Diagnosis not present

## 2022-10-03 DIAGNOSIS — G214 Vascular parkinsonism: Secondary | ICD-10-CM | POA: Diagnosis not present

## 2022-10-03 DIAGNOSIS — J45909 Unspecified asthma, uncomplicated: Secondary | ICD-10-CM | POA: Diagnosis not present

## 2022-10-03 DIAGNOSIS — F0280 Dementia in other diseases classified elsewhere without behavioral disturbance: Secondary | ICD-10-CM | POA: Diagnosis not present

## 2022-10-03 DIAGNOSIS — F0150 Vascular dementia without behavioral disturbance: Secondary | ICD-10-CM | POA: Diagnosis not present

## 2022-10-03 DIAGNOSIS — I679 Cerebrovascular disease, unspecified: Secondary | ICD-10-CM | POA: Diagnosis not present

## 2022-10-04 DIAGNOSIS — R197 Diarrhea, unspecified: Secondary | ICD-10-CM | POA: Diagnosis not present

## 2022-10-04 DIAGNOSIS — E441 Mild protein-calorie malnutrition: Secondary | ICD-10-CM | POA: Diagnosis not present

## 2022-10-04 DIAGNOSIS — I69815 Cognitive social or emotional deficit following other cerebrovascular disease: Secondary | ICD-10-CM | POA: Diagnosis not present

## 2022-10-04 DIAGNOSIS — F331 Major depressive disorder, recurrent, moderate: Secondary | ICD-10-CM | POA: Diagnosis not present

## 2022-10-04 DIAGNOSIS — F01A0 Vascular dementia, mild, without behavioral disturbance, psychotic disturbance, mood disturbance, and anxiety: Secondary | ICD-10-CM | POA: Diagnosis not present

## 2022-10-05 DIAGNOSIS — E441 Mild protein-calorie malnutrition: Secondary | ICD-10-CM | POA: Diagnosis not present

## 2022-10-05 DIAGNOSIS — F411 Generalized anxiety disorder: Secondary | ICD-10-CM | POA: Diagnosis not present

## 2022-10-06 DIAGNOSIS — F0150 Vascular dementia without behavioral disturbance: Secondary | ICD-10-CM | POA: Diagnosis not present

## 2022-10-06 DIAGNOSIS — F01A0 Vascular dementia, mild, without behavioral disturbance, psychotic disturbance, mood disturbance, and anxiety: Secondary | ICD-10-CM | POA: Diagnosis not present

## 2022-10-06 DIAGNOSIS — F411 Generalized anxiety disorder: Secondary | ICD-10-CM | POA: Diagnosis not present

## 2022-10-06 DIAGNOSIS — F331 Major depressive disorder, recurrent, moderate: Secondary | ICD-10-CM | POA: Diagnosis not present

## 2022-10-06 DIAGNOSIS — I69815 Cognitive social or emotional deficit following other cerebrovascular disease: Secondary | ICD-10-CM | POA: Diagnosis not present

## 2022-10-06 DIAGNOSIS — G214 Vascular parkinsonism: Secondary | ICD-10-CM | POA: Diagnosis not present

## 2022-10-07 DIAGNOSIS — I679 Cerebrovascular disease, unspecified: Secondary | ICD-10-CM | POA: Diagnosis not present

## 2022-10-07 DIAGNOSIS — G309 Alzheimer's disease, unspecified: Secondary | ICD-10-CM | POA: Diagnosis not present

## 2022-10-07 DIAGNOSIS — Z7982 Long term (current) use of aspirin: Secondary | ICD-10-CM | POA: Diagnosis not present

## 2022-10-07 DIAGNOSIS — L89322 Pressure ulcer of left buttock, stage 2: Secondary | ICD-10-CM | POA: Diagnosis not present

## 2022-10-07 DIAGNOSIS — F341 Dysthymic disorder: Secondary | ICD-10-CM | POA: Diagnosis not present

## 2022-10-07 DIAGNOSIS — F0150 Vascular dementia without behavioral disturbance: Secondary | ICD-10-CM | POA: Diagnosis not present

## 2022-10-07 DIAGNOSIS — N183 Chronic kidney disease, stage 3 unspecified: Secondary | ICD-10-CM | POA: Diagnosis not present

## 2022-10-07 DIAGNOSIS — G214 Vascular parkinsonism: Secondary | ICD-10-CM | POA: Diagnosis not present

## 2022-10-07 DIAGNOSIS — J45909 Unspecified asthma, uncomplicated: Secondary | ICD-10-CM | POA: Diagnosis not present

## 2022-10-07 DIAGNOSIS — F0280 Dementia in other diseases classified elsewhere without behavioral disturbance: Secondary | ICD-10-CM | POA: Diagnosis not present

## 2022-10-07 DIAGNOSIS — Z7951 Long term (current) use of inhaled steroids: Secondary | ICD-10-CM | POA: Diagnosis not present

## 2022-10-07 DIAGNOSIS — D509 Iron deficiency anemia, unspecified: Secondary | ICD-10-CM | POA: Diagnosis not present

## 2022-10-07 DIAGNOSIS — M6281 Muscle weakness (generalized): Secondary | ICD-10-CM | POA: Diagnosis not present

## 2022-10-09 DIAGNOSIS — L89322 Pressure ulcer of left buttock, stage 2: Secondary | ICD-10-CM | POA: Diagnosis not present

## 2022-10-09 DIAGNOSIS — I679 Cerebrovascular disease, unspecified: Secondary | ICD-10-CM | POA: Diagnosis not present

## 2022-10-09 DIAGNOSIS — F0280 Dementia in other diseases classified elsewhere without behavioral disturbance: Secondary | ICD-10-CM | POA: Diagnosis not present

## 2022-10-09 DIAGNOSIS — G309 Alzheimer's disease, unspecified: Secondary | ICD-10-CM | POA: Diagnosis not present

## 2022-10-09 DIAGNOSIS — G214 Vascular parkinsonism: Secondary | ICD-10-CM | POA: Diagnosis not present

## 2022-10-09 DIAGNOSIS — F0150 Vascular dementia without behavioral disturbance: Secondary | ICD-10-CM | POA: Diagnosis not present

## 2022-10-12 DIAGNOSIS — I679 Cerebrovascular disease, unspecified: Secondary | ICD-10-CM | POA: Diagnosis not present

## 2022-10-12 DIAGNOSIS — G309 Alzheimer's disease, unspecified: Secondary | ICD-10-CM | POA: Diagnosis not present

## 2022-10-12 DIAGNOSIS — F0280 Dementia in other diseases classified elsewhere without behavioral disturbance: Secondary | ICD-10-CM | POA: Diagnosis not present

## 2022-10-12 DIAGNOSIS — F0150 Vascular dementia without behavioral disturbance: Secondary | ICD-10-CM | POA: Diagnosis not present

## 2022-10-12 DIAGNOSIS — L89322 Pressure ulcer of left buttock, stage 2: Secondary | ICD-10-CM | POA: Diagnosis not present

## 2022-10-12 DIAGNOSIS — G214 Vascular parkinsonism: Secondary | ICD-10-CM | POA: Diagnosis not present

## 2022-10-16 DIAGNOSIS — Z961 Presence of intraocular lens: Secondary | ICD-10-CM | POA: Diagnosis not present

## 2022-10-17 DIAGNOSIS — G214 Vascular parkinsonism: Secondary | ICD-10-CM | POA: Diagnosis not present

## 2022-10-17 DIAGNOSIS — G309 Alzheimer's disease, unspecified: Secondary | ICD-10-CM | POA: Diagnosis not present

## 2022-10-17 DIAGNOSIS — F0280 Dementia in other diseases classified elsewhere without behavioral disturbance: Secondary | ICD-10-CM | POA: Diagnosis not present

## 2022-10-17 DIAGNOSIS — L89322 Pressure ulcer of left buttock, stage 2: Secondary | ICD-10-CM | POA: Diagnosis not present

## 2022-10-17 DIAGNOSIS — F0150 Vascular dementia without behavioral disturbance: Secondary | ICD-10-CM | POA: Diagnosis not present

## 2022-10-17 DIAGNOSIS — I679 Cerebrovascular disease, unspecified: Secondary | ICD-10-CM | POA: Diagnosis not present

## 2022-10-19 DIAGNOSIS — L89322 Pressure ulcer of left buttock, stage 2: Secondary | ICD-10-CM | POA: Diagnosis not present

## 2022-10-19 DIAGNOSIS — I679 Cerebrovascular disease, unspecified: Secondary | ICD-10-CM | POA: Diagnosis not present

## 2022-10-19 DIAGNOSIS — G309 Alzheimer's disease, unspecified: Secondary | ICD-10-CM | POA: Diagnosis not present

## 2022-10-19 DIAGNOSIS — G214 Vascular parkinsonism: Secondary | ICD-10-CM | POA: Diagnosis not present

## 2022-10-19 DIAGNOSIS — F0150 Vascular dementia without behavioral disturbance: Secondary | ICD-10-CM | POA: Diagnosis not present

## 2022-10-19 DIAGNOSIS — F0280 Dementia in other diseases classified elsewhere without behavioral disturbance: Secondary | ICD-10-CM | POA: Diagnosis not present

## 2022-10-23 DIAGNOSIS — G214 Vascular parkinsonism: Secondary | ICD-10-CM | POA: Diagnosis not present

## 2022-10-23 DIAGNOSIS — F0280 Dementia in other diseases classified elsewhere without behavioral disturbance: Secondary | ICD-10-CM | POA: Diagnosis not present

## 2022-10-23 DIAGNOSIS — L89322 Pressure ulcer of left buttock, stage 2: Secondary | ICD-10-CM | POA: Diagnosis not present

## 2022-10-23 DIAGNOSIS — F0150 Vascular dementia without behavioral disturbance: Secondary | ICD-10-CM | POA: Diagnosis not present

## 2022-10-23 DIAGNOSIS — I679 Cerebrovascular disease, unspecified: Secondary | ICD-10-CM | POA: Diagnosis not present

## 2022-10-23 DIAGNOSIS — G309 Alzheimer's disease, unspecified: Secondary | ICD-10-CM | POA: Diagnosis not present

## 2022-10-25 DIAGNOSIS — I679 Cerebrovascular disease, unspecified: Secondary | ICD-10-CM | POA: Diagnosis not present

## 2022-10-25 DIAGNOSIS — L89322 Pressure ulcer of left buttock, stage 2: Secondary | ICD-10-CM | POA: Diagnosis not present

## 2022-10-25 DIAGNOSIS — G214 Vascular parkinsonism: Secondary | ICD-10-CM | POA: Diagnosis not present

## 2022-10-26 DIAGNOSIS — F0150 Vascular dementia without behavioral disturbance: Secondary | ICD-10-CM | POA: Diagnosis not present

## 2022-10-26 DIAGNOSIS — F0280 Dementia in other diseases classified elsewhere without behavioral disturbance: Secondary | ICD-10-CM | POA: Diagnosis not present

## 2022-10-26 DIAGNOSIS — G309 Alzheimer's disease, unspecified: Secondary | ICD-10-CM | POA: Diagnosis not present

## 2022-10-26 DIAGNOSIS — G214 Vascular parkinsonism: Secondary | ICD-10-CM | POA: Diagnosis not present

## 2022-10-26 DIAGNOSIS — I679 Cerebrovascular disease, unspecified: Secondary | ICD-10-CM | POA: Diagnosis not present

## 2022-10-26 DIAGNOSIS — L89322 Pressure ulcer of left buttock, stage 2: Secondary | ICD-10-CM | POA: Diagnosis not present

## 2022-10-29 NOTE — Progress Notes (Unsigned)
Office Visit Note   Patient: Craig Vazquez           Date of Birth: [birth year]           MRN: [id number] Visit Date: 10/31/2022              Requested by: [name], MD [address] [place],  [place] [address] PCP: [name], MD   Assessment & Plan: Visit Diagnoses:  1. Primary osteoarthritis of left knee     Plan: Impression is 89 year old gentleman with left knee osteoarthritis.  We performed a steroid injection today.  Patient tolerates well.  Follow-up as needed.  Follow-Up Instructions: No follow-ups on file.   Orders:  No orders of the defined types were placed in this encounter.  No orders of the defined types were placed in this encounter.     Procedures: No procedures performed   Clinical Data: No additional findings.   Subjective: No chief complaint on file.   HPI  Craig Vazquez comes back today to try a steroid injection.  His last 1 was in January.  He does not want a gel injection.  Review of Systems   Objective: Vital Signs: There were no vitals taken for this visit.  Physical Exam  Ortho Exam  Left knee exam is unchanged.  Specialty Comments:  No specialty comments available.  Imaging: No results found.   PMFS History: Patient Active Problem List   Diagnosis Date Noted   CAP (community acquired pneumonia) 04/04/2021   Asthma exacerbation 04/04/2021   Sepsis 04/04/2021   CKD (chronic kidney disease), stage IIIa 04/04/2021   Stroke 04/04/2021   GERD (gastroesophageal reflux disease) 04/04/2021   Acute respiratory failure with hypoxia 04/04/2021   Hyponatremia 04/04/2021   Right inguinal hernia 05/11/2020   Primary osteoarthritis of left knee 09/23/2019   Recurrent incarcerated left inguinal hernia s/p open repair 04/06/2018 04/05/2018   Stroke of unknown etiology 12/12/2012   Asthma, chronic 11/08/2012   BPPV (benign paroxysmal positional vertigo) 11/08/2012   Expressive aphasia 11/07/2012   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   Past Medical History:  Diagnosis Date   Arthritis 2017   R Knee   Asthma    Diabetes mellitus without complication (HCC)    Borderline diabetic   History of hiatal hernia 2019   Hypoglycemia    Recurrent irreducible left inguinal hernia 04/05/2018   Stroke (HCC)    5.1.14   Vertigo     Family History  Problem Relation Age of Onset   Cancer Brother        lung ca x 2 brothers    Past Surgical History:  Procedure Laterality Date   DENTAL SURGERY  2008   implants upper and lower   ESOPHAGOGASTRODUODENOSCOPY (EGD) WITH PROPOFOL N/A 08/13/2018   Procedure: ESOPHAGOGASTRODUODENOSCOPY (EGD) WITH PROPOFOL;  Surgeon: Kathi Der, MD;  Location: [facility] ENDOSCOPY;  Service: Gastroenterology;  Laterality: N/A;   EYE SURGERY Bilateral    Cataract   HAND SURGERY     rt hand   herina     HERNIA REPAIR  2017   INGUINAL HERNIA REPAIR  04/05/2018   OPEN    INGUINAL HERNIA REPAIR Left 04/05/2018   Procedure: OPEN REPAIR OF LEFT INGUINAL HERNIA;  Surgeon: Claud Kelp, MD;  Location[facility];  Service: General;  Laterality: Left;   INGUINAL HERNIA REPAIR Right 05/11/2020   Procedure: REPAIR OF RIGHT INGUINAL HERNIA WITH MESH;  Surgeon: Harriette Bouillon, MD;  Location: [facility] OR;  Service: General;  Laterality: Right;   INSERTION OF MESH Left 04/05/2018   Procedure: INSERTION OF MESH;  Surgeon: Claud Kelp, MD;  Location: [facility] OR;  Service: General;  Laterality: Left;   INSERTION OF MESH Right 05/11/2020   Procedure: INSERTION OF MESH;  Surgeon: Harriette Bouillon, MD;  Location: [facility] OR;  Service: General;  Laterality: Right;   LAPAROSCOPIC GASTROTOMY W/ REPAIR OF ULCER  1970   MOHS SURGERY  2017   TEE WITHOUT CARDIOVERSION N/A 01/16/2013   Procedure: TRANSESOPHAGEAL ECHOCARDIOGRAM (TEE);  Surgeon: Vesta Mixer, MD;  Location: [facility] ENDOSCOPY;  Service: Cardiovascular;  Laterality: N/A;   Social History   Occupational History   Occupation: retired  Tobacco Use   Smoking  status: Former    Types: Cigarettes    Quit date: 1970    Years since quitting: 54.3   Smokeless tobacco: Never   Tobacco comments:    quit 1970  Vaping Use   Vaping Use: Never used  Substance and Sexual Activity   Alcohol use: Yes    Comment: BEER/WINE OCC   Drug use: No   Sexual activity: Not on file

## 2022-10-31 ENCOUNTER — Ambulatory Visit (INDEPENDENT_AMBULATORY_CARE_PROVIDER_SITE_OTHER): Payer: Medicare Other | Admitting: Orthopaedic Surgery

## 2022-10-31 DIAGNOSIS — M1712 Unilateral primary osteoarthritis, left knee: Secondary | ICD-10-CM | POA: Diagnosis not present

## 2022-10-31 DIAGNOSIS — F0280 Dementia in other diseases classified elsewhere without behavioral disturbance: Secondary | ICD-10-CM | POA: Diagnosis not present

## 2022-10-31 DIAGNOSIS — G214 Vascular parkinsonism: Secondary | ICD-10-CM | POA: Diagnosis not present

## 2022-10-31 DIAGNOSIS — G309 Alzheimer's disease, unspecified: Secondary | ICD-10-CM | POA: Diagnosis not present

## 2022-10-31 DIAGNOSIS — I679 Cerebrovascular disease, unspecified: Secondary | ICD-10-CM | POA: Diagnosis not present

## 2022-10-31 DIAGNOSIS — F0150 Vascular dementia without behavioral disturbance: Secondary | ICD-10-CM | POA: Diagnosis not present

## 2022-10-31 DIAGNOSIS — L89322 Pressure ulcer of left buttock, stage 2: Secondary | ICD-10-CM | POA: Diagnosis not present

## 2022-11-01 DIAGNOSIS — D649 Anemia, unspecified: Secondary | ICD-10-CM | POA: Diagnosis not present

## 2022-11-01 DIAGNOSIS — E46 Unspecified protein-calorie malnutrition: Secondary | ICD-10-CM | POA: Diagnosis not present

## 2022-11-02 DIAGNOSIS — G214 Vascular parkinsonism: Secondary | ICD-10-CM | POA: Diagnosis not present

## 2022-11-02 DIAGNOSIS — F0280 Dementia in other diseases classified elsewhere without behavioral disturbance: Secondary | ICD-10-CM | POA: Diagnosis not present

## 2022-11-02 DIAGNOSIS — I679 Cerebrovascular disease, unspecified: Secondary | ICD-10-CM | POA: Diagnosis not present

## 2022-11-02 DIAGNOSIS — L89322 Pressure ulcer of left buttock, stage 2: Secondary | ICD-10-CM | POA: Diagnosis not present

## 2022-11-02 DIAGNOSIS — F0150 Vascular dementia without behavioral disturbance: Secondary | ICD-10-CM | POA: Diagnosis not present

## 2022-11-02 DIAGNOSIS — G309 Alzheimer's disease, unspecified: Secondary | ICD-10-CM | POA: Diagnosis not present

## 2022-11-06 DIAGNOSIS — F341 Dysthymic disorder: Secondary | ICD-10-CM | POA: Diagnosis not present

## 2022-11-06 DIAGNOSIS — Z7951 Long term (current) use of inhaled steroids: Secondary | ICD-10-CM | POA: Diagnosis not present

## 2022-11-06 DIAGNOSIS — L89322 Pressure ulcer of left buttock, stage 2: Secondary | ICD-10-CM | POA: Diagnosis not present

## 2022-11-06 DIAGNOSIS — G309 Alzheimer's disease, unspecified: Secondary | ICD-10-CM | POA: Diagnosis not present

## 2022-11-06 DIAGNOSIS — F0280 Dementia in other diseases classified elsewhere without behavioral disturbance: Secondary | ICD-10-CM | POA: Diagnosis not present

## 2022-11-06 DIAGNOSIS — I679 Cerebrovascular disease, unspecified: Secondary | ICD-10-CM | POA: Diagnosis not present

## 2022-11-06 DIAGNOSIS — N183 Chronic kidney disease, stage 3 unspecified: Secondary | ICD-10-CM | POA: Diagnosis not present

## 2022-11-06 DIAGNOSIS — M6281 Muscle weakness (generalized): Secondary | ICD-10-CM | POA: Diagnosis not present

## 2022-11-06 DIAGNOSIS — Z7982 Long term (current) use of aspirin: Secondary | ICD-10-CM | POA: Diagnosis not present

## 2022-11-06 DIAGNOSIS — F0150 Vascular dementia without behavioral disturbance: Secondary | ICD-10-CM | POA: Diagnosis not present

## 2022-11-06 DIAGNOSIS — G214 Vascular parkinsonism: Secondary | ICD-10-CM | POA: Diagnosis not present

## 2022-11-06 DIAGNOSIS — D509 Iron deficiency anemia, unspecified: Secondary | ICD-10-CM | POA: Diagnosis not present

## 2022-11-06 DIAGNOSIS — J45909 Unspecified asthma, uncomplicated: Secondary | ICD-10-CM | POA: Diagnosis not present

## 2022-11-08 DIAGNOSIS — J309 Allergic rhinitis, unspecified: Secondary | ICD-10-CM | POA: Diagnosis not present

## 2022-11-08 DIAGNOSIS — R509 Fever, unspecified: Secondary | ICD-10-CM | POA: Diagnosis not present

## 2022-11-08 DIAGNOSIS — M6281 Muscle weakness (generalized): Secondary | ICD-10-CM | POA: Diagnosis not present

## 2022-11-08 DIAGNOSIS — R634 Abnormal weight loss: Secondary | ICD-10-CM | POA: Diagnosis not present

## 2022-11-08 DIAGNOSIS — G214 Vascular parkinsonism: Secondary | ICD-10-CM | POA: Diagnosis not present

## 2022-11-08 DIAGNOSIS — F01A0 Vascular dementia, mild, without behavioral disturbance, psychotic disturbance, mood disturbance, and anxiety: Secondary | ICD-10-CM | POA: Diagnosis not present

## 2022-11-08 DIAGNOSIS — K219 Gastro-esophageal reflux disease without esophagitis: Secondary | ICD-10-CM | POA: Diagnosis not present

## 2022-11-08 DIAGNOSIS — R269 Unspecified abnormalities of gait and mobility: Secondary | ICD-10-CM | POA: Diagnosis not present

## 2022-11-08 DIAGNOSIS — K5901 Slow transit constipation: Secondary | ICD-10-CM | POA: Diagnosis not present

## 2022-11-08 DIAGNOSIS — L89322 Pressure ulcer of left buttock, stage 2: Secondary | ICD-10-CM | POA: Diagnosis not present

## 2022-11-09 DIAGNOSIS — F0280 Dementia in other diseases classified elsewhere without behavioral disturbance: Secondary | ICD-10-CM | POA: Diagnosis not present

## 2022-11-09 DIAGNOSIS — I679 Cerebrovascular disease, unspecified: Secondary | ICD-10-CM | POA: Diagnosis not present

## 2022-11-09 DIAGNOSIS — G214 Vascular parkinsonism: Secondary | ICD-10-CM | POA: Diagnosis not present

## 2022-11-09 DIAGNOSIS — F0150 Vascular dementia without behavioral disturbance: Secondary | ICD-10-CM | POA: Diagnosis not present

## 2022-11-09 DIAGNOSIS — L89322 Pressure ulcer of left buttock, stage 2: Secondary | ICD-10-CM | POA: Diagnosis not present

## 2022-11-09 DIAGNOSIS — G309 Alzheimer's disease, unspecified: Secondary | ICD-10-CM | POA: Diagnosis not present

## 2022-11-10 DIAGNOSIS — Z79899 Other long term (current) drug therapy: Secondary | ICD-10-CM | POA: Diagnosis not present

## 2022-11-14 DIAGNOSIS — G214 Vascular parkinsonism: Secondary | ICD-10-CM | POA: Diagnosis not present

## 2022-11-14 DIAGNOSIS — I679 Cerebrovascular disease, unspecified: Secondary | ICD-10-CM | POA: Diagnosis not present

## 2022-11-14 DIAGNOSIS — L89322 Pressure ulcer of left buttock, stage 2: Secondary | ICD-10-CM | POA: Diagnosis not present

## 2022-11-14 DIAGNOSIS — G309 Alzheimer's disease, unspecified: Secondary | ICD-10-CM | POA: Diagnosis not present

## 2022-11-14 DIAGNOSIS — F0280 Dementia in other diseases classified elsewhere without behavioral disturbance: Secondary | ICD-10-CM | POA: Diagnosis not present

## 2022-11-14 DIAGNOSIS — F0150 Vascular dementia without behavioral disturbance: Secondary | ICD-10-CM | POA: Diagnosis not present

## 2022-11-15 DIAGNOSIS — G20A1 Parkinson's disease without dyskinesia, without mention of fluctuations: Secondary | ICD-10-CM | POA: Diagnosis not present

## 2022-11-15 DIAGNOSIS — R609 Edema, unspecified: Secondary | ICD-10-CM | POA: Diagnosis not present

## 2022-11-15 DIAGNOSIS — R509 Fever, unspecified: Secondary | ICD-10-CM | POA: Diagnosis not present

## 2022-11-15 DIAGNOSIS — R269 Unspecified abnormalities of gait and mobility: Secondary | ICD-10-CM | POA: Diagnosis not present

## 2022-11-15 DIAGNOSIS — M6281 Muscle weakness (generalized): Secondary | ICD-10-CM | POA: Diagnosis not present

## 2022-11-15 DIAGNOSIS — D649 Anemia, unspecified: Secondary | ICD-10-CM | POA: Diagnosis not present

## 2022-11-16 DIAGNOSIS — G214 Vascular parkinsonism: Secondary | ICD-10-CM | POA: Diagnosis not present

## 2022-11-16 DIAGNOSIS — F0150 Vascular dementia without behavioral disturbance: Secondary | ICD-10-CM | POA: Diagnosis not present

## 2022-11-16 DIAGNOSIS — G309 Alzheimer's disease, unspecified: Secondary | ICD-10-CM | POA: Diagnosis not present

## 2022-11-16 DIAGNOSIS — L89322 Pressure ulcer of left buttock, stage 2: Secondary | ICD-10-CM | POA: Diagnosis not present

## 2022-11-16 DIAGNOSIS — F0280 Dementia in other diseases classified elsewhere without behavioral disturbance: Secondary | ICD-10-CM | POA: Diagnosis not present

## 2022-11-16 DIAGNOSIS — I679 Cerebrovascular disease, unspecified: Secondary | ICD-10-CM | POA: Diagnosis not present

## 2022-11-17 DIAGNOSIS — I6990 Unspecified sequelae of unspecified cerebrovascular disease: Secondary | ICD-10-CM | POA: Diagnosis not present

## 2022-11-17 DIAGNOSIS — K219 Gastro-esophageal reflux disease without esophagitis: Secondary | ICD-10-CM | POA: Diagnosis not present

## 2022-11-17 DIAGNOSIS — M6281 Muscle weakness (generalized): Secondary | ICD-10-CM | POA: Diagnosis not present

## 2022-11-17 DIAGNOSIS — N183 Chronic kidney disease, stage 3 unspecified: Secondary | ICD-10-CM | POA: Diagnosis not present

## 2022-11-17 DIAGNOSIS — F331 Major depressive disorder, recurrent, moderate: Secondary | ICD-10-CM | POA: Diagnosis not present

## 2022-11-17 DIAGNOSIS — G20A1 Parkinson's disease without dyskinesia, without mention of fluctuations: Secondary | ICD-10-CM | POA: Diagnosis not present

## 2022-11-17 DIAGNOSIS — F02A0 Dementia in other diseases classified elsewhere, mild, without behavioral disturbance, psychotic disturbance, mood disturbance, and anxiety: Secondary | ICD-10-CM | POA: Diagnosis not present

## 2022-11-20 DIAGNOSIS — F0150 Vascular dementia without behavioral disturbance: Secondary | ICD-10-CM | POA: Diagnosis not present

## 2022-11-20 DIAGNOSIS — L89322 Pressure ulcer of left buttock, stage 2: Secondary | ICD-10-CM | POA: Diagnosis not present

## 2022-11-20 DIAGNOSIS — G309 Alzheimer's disease, unspecified: Secondary | ICD-10-CM | POA: Diagnosis not present

## 2022-11-20 DIAGNOSIS — F0280 Dementia in other diseases classified elsewhere without behavioral disturbance: Secondary | ICD-10-CM | POA: Diagnosis not present

## 2022-11-20 DIAGNOSIS — G214 Vascular parkinsonism: Secondary | ICD-10-CM | POA: Diagnosis not present

## 2022-11-20 DIAGNOSIS — I679 Cerebrovascular disease, unspecified: Secondary | ICD-10-CM | POA: Diagnosis not present

## 2022-11-21 DIAGNOSIS — G214 Vascular parkinsonism: Secondary | ICD-10-CM | POA: Diagnosis not present

## 2022-11-21 DIAGNOSIS — F0150 Vascular dementia without behavioral disturbance: Secondary | ICD-10-CM | POA: Diagnosis not present

## 2022-11-21 DIAGNOSIS — G309 Alzheimer's disease, unspecified: Secondary | ICD-10-CM | POA: Diagnosis not present

## 2022-11-21 DIAGNOSIS — F0280 Dementia in other diseases classified elsewhere without behavioral disturbance: Secondary | ICD-10-CM | POA: Diagnosis not present

## 2022-11-21 DIAGNOSIS — L89322 Pressure ulcer of left buttock, stage 2: Secondary | ICD-10-CM | POA: Diagnosis not present

## 2022-11-21 DIAGNOSIS — I679 Cerebrovascular disease, unspecified: Secondary | ICD-10-CM | POA: Diagnosis not present

## 2022-11-22 ENCOUNTER — Telehealth: Payer: Self-pay | Admitting: Orthopaedic Surgery

## 2022-11-22 NOTE — Telephone Encounter (Signed)
Received vm from Connie,pts daughter. She is requesting authorization form for medical records to be emailed to her. IC,lmvm for her to [facility] and to leave her email address on my vm if I don't answer. I will email auth when I know where to send.ph [phone]

## 2022-11-23 DIAGNOSIS — I679 Cerebrovascular disease, unspecified: Secondary | ICD-10-CM | POA: Diagnosis not present

## 2022-11-23 DIAGNOSIS — G214 Vascular parkinsonism: Secondary | ICD-10-CM | POA: Diagnosis not present

## 2022-11-23 DIAGNOSIS — F0280 Dementia in other diseases classified elsewhere without behavioral disturbance: Secondary | ICD-10-CM | POA: Diagnosis not present

## 2022-11-23 DIAGNOSIS — L89322 Pressure ulcer of left buttock, stage 2: Secondary | ICD-10-CM | POA: Diagnosis not present

## 2022-11-23 DIAGNOSIS — F0150 Vascular dementia without behavioral disturbance: Secondary | ICD-10-CM | POA: Diagnosis not present

## 2022-11-23 DIAGNOSIS — G309 Alzheimer's disease, unspecified: Secondary | ICD-10-CM | POA: Diagnosis not present

## 2022-11-24 DIAGNOSIS — G309 Alzheimer's disease, unspecified: Secondary | ICD-10-CM | POA: Diagnosis not present

## 2022-11-24 DIAGNOSIS — D649 Anemia, unspecified: Secondary | ICD-10-CM | POA: Diagnosis not present

## 2022-11-24 DIAGNOSIS — F0150 Vascular dementia without behavioral disturbance: Secondary | ICD-10-CM | POA: Diagnosis not present

## 2022-11-24 DIAGNOSIS — I679 Cerebrovascular disease, unspecified: Secondary | ICD-10-CM | POA: Diagnosis not present

## 2022-11-24 DIAGNOSIS — G214 Vascular parkinsonism: Secondary | ICD-10-CM | POA: Diagnosis not present

## 2022-11-24 DIAGNOSIS — F0280 Dementia in other diseases classified elsewhere without behavioral disturbance: Secondary | ICD-10-CM | POA: Diagnosis not present

## 2022-11-24 DIAGNOSIS — L89322 Pressure ulcer of left buttock, stage 2: Secondary | ICD-10-CM | POA: Diagnosis not present

## 2022-11-27 DIAGNOSIS — G214 Vascular parkinsonism: Secondary | ICD-10-CM | POA: Diagnosis not present

## 2022-11-27 DIAGNOSIS — I679 Cerebrovascular disease, unspecified: Secondary | ICD-10-CM | POA: Diagnosis not present

## 2022-11-27 DIAGNOSIS — G309 Alzheimer's disease, unspecified: Secondary | ICD-10-CM | POA: Diagnosis not present

## 2022-11-27 DIAGNOSIS — L89322 Pressure ulcer of left buttock, stage 2: Secondary | ICD-10-CM | POA: Diagnosis not present

## 2022-11-27 DIAGNOSIS — F0150 Vascular dementia without behavioral disturbance: Secondary | ICD-10-CM | POA: Diagnosis not present

## 2022-11-27 DIAGNOSIS — F0280 Dementia in other diseases classified elsewhere without behavioral disturbance: Secondary | ICD-10-CM | POA: Diagnosis not present

## 2022-11-28 DIAGNOSIS — K219 Gastro-esophageal reflux disease without esophagitis: Secondary | ICD-10-CM | POA: Diagnosis not present

## 2022-11-28 DIAGNOSIS — D649 Anemia, unspecified: Secondary | ICD-10-CM | POA: Diagnosis not present

## 2022-11-28 DIAGNOSIS — G214 Vascular parkinsonism: Secondary | ICD-10-CM | POA: Diagnosis not present

## 2022-11-28 DIAGNOSIS — F01A0 Vascular dementia, mild, without behavioral disturbance, psychotic disturbance, mood disturbance, and anxiety: Secondary | ICD-10-CM | POA: Diagnosis not present

## 2022-11-28 DIAGNOSIS — F331 Major depressive disorder, recurrent, moderate: Secondary | ICD-10-CM | POA: Diagnosis not present

## 2022-11-28 DIAGNOSIS — Z6820 Body mass index (BMI) 20.0-20.9, adult: Secondary | ICD-10-CM | POA: Diagnosis not present

## 2022-11-28 DIAGNOSIS — N183 Chronic kidney disease, stage 3 unspecified: Secondary | ICD-10-CM | POA: Diagnosis not present

## 2022-11-28 DIAGNOSIS — I6990 Unspecified sequelae of unspecified cerebrovascular disease: Secondary | ICD-10-CM | POA: Diagnosis not present

## 2022-11-28 DIAGNOSIS — Z0000 Encounter for general adult medical examination without abnormal findings: Secondary | ICD-10-CM | POA: Diagnosis not present

## 2022-11-28 DIAGNOSIS — R634 Abnormal weight loss: Secondary | ICD-10-CM | POA: Diagnosis not present

## 2022-11-28 DIAGNOSIS — M6281 Muscle weakness (generalized): Secondary | ICD-10-CM | POA: Diagnosis not present

## 2022-11-28 DIAGNOSIS — R269 Unspecified abnormalities of gait and mobility: Secondary | ICD-10-CM | POA: Diagnosis not present

## 2022-11-29 DIAGNOSIS — I679 Cerebrovascular disease, unspecified: Secondary | ICD-10-CM | POA: Diagnosis not present

## 2022-11-29 DIAGNOSIS — L89322 Pressure ulcer of left buttock, stage 2: Secondary | ICD-10-CM | POA: Diagnosis not present

## 2022-11-29 DIAGNOSIS — F0150 Vascular dementia without behavioral disturbance: Secondary | ICD-10-CM | POA: Diagnosis not present

## 2022-11-29 DIAGNOSIS — G214 Vascular parkinsonism: Secondary | ICD-10-CM | POA: Diagnosis not present

## 2022-11-29 DIAGNOSIS — G309 Alzheimer's disease, unspecified: Secondary | ICD-10-CM | POA: Diagnosis not present

## 2022-11-29 DIAGNOSIS — F0280 Dementia in other diseases classified elsewhere without behavioral disturbance: Secondary | ICD-10-CM | POA: Diagnosis not present

## 2022-11-30 DIAGNOSIS — G309 Alzheimer's disease, unspecified: Secondary | ICD-10-CM | POA: Diagnosis not present

## 2022-11-30 DIAGNOSIS — F0150 Vascular dementia without behavioral disturbance: Secondary | ICD-10-CM | POA: Diagnosis not present

## 2022-11-30 DIAGNOSIS — I679 Cerebrovascular disease, unspecified: Secondary | ICD-10-CM | POA: Diagnosis not present

## 2022-11-30 DIAGNOSIS — M1712 Unilateral primary osteoarthritis, left knee: Secondary | ICD-10-CM | POA: Diagnosis not present

## 2022-11-30 DIAGNOSIS — G214 Vascular parkinsonism: Secondary | ICD-10-CM | POA: Diagnosis not present

## 2022-11-30 DIAGNOSIS — M25562 Pain in left knee: Secondary | ICD-10-CM | POA: Diagnosis not present

## 2022-11-30 DIAGNOSIS — F0280 Dementia in other diseases classified elsewhere without behavioral disturbance: Secondary | ICD-10-CM | POA: Diagnosis not present

## 2022-11-30 DIAGNOSIS — L89322 Pressure ulcer of left buttock, stage 2: Secondary | ICD-10-CM | POA: Diagnosis not present

## 2022-12-01 DIAGNOSIS — G214 Vascular parkinsonism: Secondary | ICD-10-CM | POA: Diagnosis not present

## 2022-12-01 DIAGNOSIS — I679 Cerebrovascular disease, unspecified: Secondary | ICD-10-CM | POA: Diagnosis not present

## 2022-12-01 DIAGNOSIS — F0280 Dementia in other diseases classified elsewhere without behavioral disturbance: Secondary | ICD-10-CM | POA: Diagnosis not present

## 2022-12-01 DIAGNOSIS — L89322 Pressure ulcer of left buttock, stage 2: Secondary | ICD-10-CM | POA: Diagnosis not present

## 2022-12-01 DIAGNOSIS — F0150 Vascular dementia without behavioral disturbance: Secondary | ICD-10-CM | POA: Diagnosis not present

## 2022-12-01 DIAGNOSIS — G309 Alzheimer's disease, unspecified: Secondary | ICD-10-CM | POA: Diagnosis not present

## 2022-12-05 ENCOUNTER — Encounter: Payer: Medicare Other | Attending: Physician Assistant | Admitting: Physician Assistant

## 2022-12-05 DIAGNOSIS — L98411 Non-pressure chronic ulcer of buttock limited to breakdown of skin: Secondary | ICD-10-CM | POA: Insufficient documentation

## 2022-12-05 DIAGNOSIS — Z8673 Personal history of transient ischemic attack (TIA), and cerebral infarction without residual deficits: Secondary | ICD-10-CM | POA: Insufficient documentation

## 2022-12-05 DIAGNOSIS — F0150 Vascular dementia without behavioral disturbance: Secondary | ICD-10-CM | POA: Diagnosis not present

## 2022-12-05 DIAGNOSIS — G214 Vascular parkinsonism: Secondary | ICD-10-CM | POA: Diagnosis not present

## 2022-12-05 DIAGNOSIS — L24A9 Irritant contact dermatitis due friction or contact with other specified body fluids: Secondary | ICD-10-CM | POA: Insufficient documentation

## 2022-12-05 DIAGNOSIS — G309 Alzheimer's disease, unspecified: Secondary | ICD-10-CM | POA: Diagnosis not present

## 2022-12-05 DIAGNOSIS — L89322 Pressure ulcer of left buttock, stage 2: Secondary | ICD-10-CM | POA: Diagnosis not present

## 2022-12-05 DIAGNOSIS — I679 Cerebrovascular disease, unspecified: Secondary | ICD-10-CM | POA: Diagnosis not present

## 2022-12-05 DIAGNOSIS — F0280 Dementia in other diseases classified elsewhere without behavioral disturbance: Secondary | ICD-10-CM | POA: Diagnosis not present

## 2022-12-06 DIAGNOSIS — S31809D Unspecified open wound of unspecified buttock, subsequent encounter: Secondary | ICD-10-CM | POA: Diagnosis not present

## 2022-12-06 DIAGNOSIS — I679 Cerebrovascular disease, unspecified: Secondary | ICD-10-CM | POA: Diagnosis not present

## 2022-12-06 DIAGNOSIS — Z8673 Personal history of transient ischemic attack (TIA), and cerebral infarction without residual deficits: Secondary | ICD-10-CM | POA: Diagnosis not present

## 2022-12-06 DIAGNOSIS — D509 Iron deficiency anemia, unspecified: Secondary | ICD-10-CM | POA: Diagnosis not present

## 2022-12-06 DIAGNOSIS — Z7951 Long term (current) use of inhaled steroids: Secondary | ICD-10-CM | POA: Diagnosis not present

## 2022-12-06 DIAGNOSIS — N183 Chronic kidney disease, stage 3 unspecified: Secondary | ICD-10-CM | POA: Diagnosis not present

## 2022-12-06 DIAGNOSIS — J45909 Unspecified asthma, uncomplicated: Secondary | ICD-10-CM | POA: Diagnosis not present

## 2022-12-06 DIAGNOSIS — F0280 Dementia in other diseases classified elsewhere without behavioral disturbance: Secondary | ICD-10-CM | POA: Diagnosis not present

## 2022-12-06 DIAGNOSIS — G309 Alzheimer's disease, unspecified: Secondary | ICD-10-CM | POA: Diagnosis not present

## 2022-12-06 DIAGNOSIS — F341 Dysthymic disorder: Secondary | ICD-10-CM | POA: Diagnosis not present

## 2022-12-06 DIAGNOSIS — M6281 Muscle weakness (generalized): Secondary | ICD-10-CM | POA: Diagnosis not present

## 2022-12-06 DIAGNOSIS — G214 Vascular parkinsonism: Secondary | ICD-10-CM | POA: Diagnosis not present

## 2022-12-06 DIAGNOSIS — L89322 Pressure ulcer of left buttock, stage 2: Secondary | ICD-10-CM | POA: Diagnosis not present

## 2022-12-06 DIAGNOSIS — F01A0 Vascular dementia, mild, without behavioral disturbance, psychotic disturbance, mood disturbance, and anxiety: Secondary | ICD-10-CM | POA: Diagnosis not present

## 2022-12-06 DIAGNOSIS — E46 Unspecified protein-calorie malnutrition: Secondary | ICD-10-CM | POA: Diagnosis not present

## 2022-12-06 DIAGNOSIS — Z7982 Long term (current) use of aspirin: Secondary | ICD-10-CM | POA: Diagnosis not present

## 2022-12-06 DIAGNOSIS — R269 Unspecified abnormalities of gait and mobility: Secondary | ICD-10-CM | POA: Diagnosis not present

## 2022-12-08 DIAGNOSIS — D649 Anemia, unspecified: Secondary | ICD-10-CM | POA: Diagnosis not present

## 2022-12-08 DIAGNOSIS — E46 Unspecified protein-calorie malnutrition: Secondary | ICD-10-CM | POA: Diagnosis not present

## 2022-12-11 DIAGNOSIS — F0280 Dementia in other diseases classified elsewhere without behavioral disturbance: Secondary | ICD-10-CM | POA: Diagnosis not present

## 2022-12-11 DIAGNOSIS — G309 Alzheimer's disease, unspecified: Secondary | ICD-10-CM | POA: Diagnosis not present

## 2022-12-11 DIAGNOSIS — F01A0 Vascular dementia, mild, without behavioral disturbance, psychotic disturbance, mood disturbance, and anxiety: Secondary | ICD-10-CM | POA: Diagnosis not present

## 2022-12-11 DIAGNOSIS — J45909 Unspecified asthma, uncomplicated: Secondary | ICD-10-CM | POA: Diagnosis not present

## 2022-12-11 DIAGNOSIS — L89322 Pressure ulcer of left buttock, stage 2: Secondary | ICD-10-CM | POA: Diagnosis not present

## 2022-12-11 DIAGNOSIS — G214 Vascular parkinsonism: Secondary | ICD-10-CM | POA: Diagnosis not present

## 2022-12-12 DIAGNOSIS — G214 Vascular parkinsonism: Secondary | ICD-10-CM | POA: Diagnosis not present

## 2022-12-12 DIAGNOSIS — F01A0 Vascular dementia, mild, without behavioral disturbance, psychotic disturbance, mood disturbance, and anxiety: Secondary | ICD-10-CM | POA: Diagnosis not present

## 2022-12-12 DIAGNOSIS — F0280 Dementia in other diseases classified elsewhere without behavioral disturbance: Secondary | ICD-10-CM | POA: Diagnosis not present

## 2022-12-12 DIAGNOSIS — L89322 Pressure ulcer of left buttock, stage 2: Secondary | ICD-10-CM | POA: Diagnosis not present

## 2022-12-12 DIAGNOSIS — J45909 Unspecified asthma, uncomplicated: Secondary | ICD-10-CM | POA: Diagnosis not present

## 2022-12-12 DIAGNOSIS — G309 Alzheimer's disease, unspecified: Secondary | ICD-10-CM | POA: Diagnosis not present

## 2022-12-13 DIAGNOSIS — G214 Vascular parkinsonism: Secondary | ICD-10-CM | POA: Diagnosis not present

## 2022-12-13 DIAGNOSIS — F01A0 Vascular dementia, mild, without behavioral disturbance, psychotic disturbance, mood disturbance, and anxiety: Secondary | ICD-10-CM | POA: Diagnosis not present

## 2022-12-13 DIAGNOSIS — F0280 Dementia in other diseases classified elsewhere without behavioral disturbance: Secondary | ICD-10-CM | POA: Diagnosis not present

## 2022-12-13 DIAGNOSIS — G309 Alzheimer's disease, unspecified: Secondary | ICD-10-CM | POA: Diagnosis not present

## 2022-12-13 DIAGNOSIS — L89322 Pressure ulcer of left buttock, stage 2: Secondary | ICD-10-CM | POA: Diagnosis not present

## 2022-12-13 DIAGNOSIS — J45909 Unspecified asthma, uncomplicated: Secondary | ICD-10-CM | POA: Diagnosis not present

## 2022-12-14 ENCOUNTER — Encounter: Payer: Medicare Other | Attending: Physician Assistant | Admitting: Physician Assistant

## 2022-12-14 DIAGNOSIS — G214 Vascular parkinsonism: Secondary | ICD-10-CM | POA: Diagnosis not present

## 2022-12-14 DIAGNOSIS — G309 Alzheimer's disease, unspecified: Secondary | ICD-10-CM | POA: Diagnosis not present

## 2022-12-14 DIAGNOSIS — L24A9 Irritant contact dermatitis due friction or contact with other specified body fluids: Secondary | ICD-10-CM | POA: Diagnosis not present

## 2022-12-14 DIAGNOSIS — F01A0 Vascular dementia, mild, without behavioral disturbance, psychotic disturbance, mood disturbance, and anxiety: Secondary | ICD-10-CM | POA: Diagnosis not present

## 2022-12-14 DIAGNOSIS — L98411 Non-pressure chronic ulcer of buttock limited to breakdown of skin: Secondary | ICD-10-CM | POA: Insufficient documentation

## 2022-12-14 DIAGNOSIS — Z8673 Personal history of transient ischemic attack (TIA), and cerebral infarction without residual deficits: Secondary | ICD-10-CM | POA: Diagnosis not present

## 2022-12-14 DIAGNOSIS — J45909 Unspecified asthma, uncomplicated: Secondary | ICD-10-CM | POA: Diagnosis not present

## 2022-12-14 DIAGNOSIS — F0280 Dementia in other diseases classified elsewhere without behavioral disturbance: Secondary | ICD-10-CM | POA: Diagnosis not present

## 2022-12-14 DIAGNOSIS — L89322 Pressure ulcer of left buttock, stage 2: Secondary | ICD-10-CM | POA: Diagnosis not present

## 2022-12-14 NOTE — Progress Notes (Signed)
Craig, Vazquez ([id number]) [phone] [id number] Page 1 of 4 Visit Report for 12/05/2022 Abuse Risk Screen Details Patient Name: Date of Service: Craig Sartorius MA S E. 12/05/2022 2:00 PM Medical Record Number: [id number] Patient Account Number: [id number] Date of Birth/Sex: Treating RN: 1930/07/17 (89 y.o. Craig Vazquez Primary Care Kayani Rapaport: [name] Other Clinician: Referring Sircharles Holzheimer: Treating Mazell Aylesworth/Extender: [name]: 0 Abuse Risk Screen Items Answer Electronic Signature(s) Signed: 12/14/2022 11:10:44 AM By: Midge Aver MSN RN CNS WTA Entered By: Midge Aver on 12/05/2022 14:18:16 -------------------------------------------------------------------------------- Activities of Daily Living Details Patient Name: Date of Service: Craig Sartorius MA S E. 12/05/2022 2:00 PM Medical Record Number: [id number] Patient Account Number: [id number] Date of Birth/Sex: Treating RN: January 16, 1931 (89 y.o. Craig Vazquez Primary Care Craig Vazquez: [name] Other Clinician: Referring Craig Vazquez: Treating Craig Vazquez/Extender: [name]: 0 Activities of Daily Living Items Answer Activities of Daily Living (Please select one for each item) Drive Automobile Not Able T Medications ake Not Able Use T elephone Need Assistance Care for Appearance Need Assistance Use T oilet Need Assistance Bath / Shower Need Assistance Dress Self Need Assistance Feed Self Need Assistance Walk Need Assistance Get In / Out Bed Need Assistance Housework Not Able Prepare Meals Not Able Handle Money Not Able Shop for Self Not Able Electronic Signature(s) Signed: 12/14/2022 11:10:44 AM By: Midge Aver MSN RN CNS WTA Entered By: Midge Aver on 12/05/2022 14:18:46 -------------------------------------------------------------------------------- Education Screening Details Patient Name: Date of Service: Craig Sartorius MA  S E. 12/05/2022 2:00 PM Medical Record Number: [id number] Patient Account Number: [id number] Date of Birth/Sex: Treating RN: Mar 15, 1931 (89 y.o. Craig Vazquez Primary Care Oluwasemilore Bahl: [name] Other Clinician: Referring Ranvir Renovato: Treating Craig Vazquez/Extender: Craig Vazquez in Treatment: 0 Learning Preferences/Education Level/Primary Language Learning Preference: Explanation, Demonstration Craig, Vazquez ([id number]) [id number] Page 2 of 4 Highest Education Level: College or Above Preferred Language: English Cognitive Barrier Language Barrier: No Translator Needed: No Memory Deficit: No Emotional Barrier: No Cultural/Religious Beliefs Affecting Medical Care: No Physical Barrier Impaired Vision: Yes Glasses Impaired Hearing: Yes Hearing Aid Knowledge/Comprehension Knowledge Level: High Comprehension Level: High Ability to understand written instructions: High Ability to understand verbal instructions: High Motivation Anxiety Level: Calm Cooperation: Cooperative Education Importance: Acknowledges Need Interest in Health Problems: Asks Questions Perception: Coherent Willingness to Engage in Self-Management High Activities: Readiness to Engage in Self-Management High Activities: Electronic Signature(s) Signed: 12/14/2022 11:10:44 AM By: Midge Aver MSN RN CNS WTA Entered By: Midge Aver on 12/05/2022 14:19:41 -------------------------------------------------------------------------------- Fall Risk Assessment Details Patient Name: Date of Service: Craig Sartorius MA S E. 12/05/2022 2:00 PM Medical Record Number: [id number] Patient Account Number: [id number] Date of Birth/Sex: Treating RN: 1931-03-17 (89 y.o. Craig Vazquez Primary Care Craig Vazquez: [name] Other Clinician: Referring Craig Vazquez: Treating Craig Vazquez/Extender: [name]: 0 Fall Risk Assessment Items Have you had 2 or  more falls in the last 12 monthso 0 No Have you had any fall that resulted in injury in the last 12 monthso 0 No FALLS RISK SCREEN History of falling - immediate or within 3 months 0 No Secondary diagnosis (Do you have 2 or more medical diagnoseso) 0 No Ambulatory aid None/bed rest/wheelchair/nurse 0 No Crutches/cane/walker 15 Yes Furniture 0 No Intravenous therapy Access/Saline/Heparin Lock 0 No Gait/Transferring Normal/ bed rest/ wheelchair 0 Yes Weak (short steps with or without shuffle, stooped but able to lift head while walking,  may seek 10 Yes support from furniture) Impaired (short steps with shuffle, may have difficulty arising from chair, head down, impaired 20 Yes balance) Mental Status Oriented to own ability 0 Yes Electronic Signature(s) Signed: 12/14/2022 11:10:44 AM By: Midge Aver MSN RN CNS WTA Entered By: Midge Aver on 12/05/2022 14:20:26 Ples Specter ([id number]) [id number] Page 3 of 4 -------------------------------------------------------------------------------- Foot Assessment Details Patient Name: Date of Service: Craig Sartorius MA S E. 12/05/2022 2:00 PM Medical Record Number: [id number] Patient Account Number: [id number] Date of Birth/Sex: Treating RN: 1930-08-04 (89 y.o. Craig Vazquez Primary Care Craig Vazquez: [name] Other Clinician: Referring Craig Vazquez: Treating Craig Vazquez/Extender: [name]: 0 Foot Assessment Items Site Locations + = Sensation present, - = Sensation absent, C = Callus, U = Ulcer R = Redness, W = Warmth, M = Maceration, PU = Pre-ulcerative lesion F = Fissure, S = Swelling, D = Dryness Assessment Right: Left: Other Deformity: No No Prior Foot Ulcer: No No Prior Amputation: No No Charcot Joint: No No Ambulatory Status: Ambulatory With Help Assistance Device: Walker Gait: Surveyor, mining) Signed: 12/14/2022 11:10:44 AM By: Midge Aver MSN  RN CNS WTA Entered By: Midge Aver on 12/05/2022 14:21:35 -------------------------------------------------------------------------------- Nutrition Risk Screening Details Patient Name: Date of Service: Craig Sartorius MA S E. 12/05/2022 2:00 PM Medical Record Number: [id number] Patient Account Number: [id number] Date of Birth/Sex: Treating RN: Feb 20, 1931 (89 y.o. Craig Vazquez Primary Care Paz Winsett: [name] Other Clinician: Referring Nyiesha Beever: Treating Yanissa Michalsky/Extender: [name]: 0 Height (in): 68 Weight (lbs): 118 Body Mass Index (BMI): 17.9 Nutrition Risk Screening Items Score Screening JVION, MIAZGA E ([id number]) [id number] Page 4 of 4 NUTRITION RISK SCREEN: I have an illness or condition that made me change the kind and/or amount of food I eat 0 No I eat fewer than two meals per day 0 No I eat few fruits and vegetables, or milk products 0 No I have three or more drinks of beer, liquor or wine almost every day 0 No I have tooth or mouth problems that make it hard for me to eat 0 No I don't always have enough money to buy the food I need 0 No I eat alone most of the time 0 No I take three or more different prescribed or over-the-counter drugs a day 1 Yes Without wanting to, I have lost or gained 10 pounds in the last six months 2 Yes I am not always physically able to shop, cook and/or feed myself 0 No Nutrition Protocols Good Risk Protocol Moderate Risk Protocol 0 Provide education on nutrition High Risk Proctocol Risk Level: Moderate Risk Score: 3 Electronic Signature(s) Signed: 12/14/2022 11:10:44 AM By: Midge Aver MSN RN CNS WTA Entered By: Midge Aver on 12/05/2022 14:21:16

## 2022-12-15 DIAGNOSIS — F411 Generalized anxiety disorder: Secondary | ICD-10-CM | POA: Diagnosis not present

## 2022-12-15 DIAGNOSIS — L89322 Pressure ulcer of left buttock, stage 2: Secondary | ICD-10-CM | POA: Diagnosis not present

## 2022-12-15 DIAGNOSIS — F5105 Insomnia due to other mental disorder: Secondary | ICD-10-CM | POA: Diagnosis not present

## 2022-12-15 DIAGNOSIS — G309 Alzheimer's disease, unspecified: Secondary | ICD-10-CM | POA: Diagnosis not present

## 2022-12-15 DIAGNOSIS — F01A0 Vascular dementia, mild, without behavioral disturbance, psychotic disturbance, mood disturbance, and anxiety: Secondary | ICD-10-CM | POA: Diagnosis not present

## 2022-12-15 DIAGNOSIS — J45909 Unspecified asthma, uncomplicated: Secondary | ICD-10-CM | POA: Diagnosis not present

## 2022-12-15 DIAGNOSIS — G214 Vascular parkinsonism: Secondary | ICD-10-CM | POA: Diagnosis not present

## 2022-12-15 DIAGNOSIS — F0280 Dementia in other diseases classified elsewhere without behavioral disturbance: Secondary | ICD-10-CM | POA: Diagnosis not present

## 2022-12-15 DIAGNOSIS — F331 Major depressive disorder, recurrent, moderate: Secondary | ICD-10-CM | POA: Diagnosis not present

## 2022-12-18 DIAGNOSIS — N1831 Chronic kidney disease, stage 3a: Secondary | ICD-10-CM | POA: Diagnosis not present

## 2022-12-18 DIAGNOSIS — G214 Vascular parkinsonism: Secondary | ICD-10-CM | POA: Diagnosis not present

## 2022-12-18 DIAGNOSIS — E1122 Type 2 diabetes mellitus with diabetic chronic kidney disease: Secondary | ICD-10-CM | POA: Diagnosis not present

## 2022-12-18 DIAGNOSIS — Z8673 Personal history of transient ischemic attack (TIA), and cerebral infarction without residual deficits: Secondary | ICD-10-CM | POA: Diagnosis not present

## 2022-12-18 DIAGNOSIS — J45909 Unspecified asthma, uncomplicated: Secondary | ICD-10-CM | POA: Diagnosis not present

## 2022-12-18 DIAGNOSIS — K219 Gastro-esophageal reflux disease without esophagitis: Secondary | ICD-10-CM | POA: Diagnosis not present

## 2022-12-18 DIAGNOSIS — F32A Depression, unspecified: Secondary | ICD-10-CM | POA: Diagnosis not present

## 2022-12-18 DIAGNOSIS — L89322 Pressure ulcer of left buttock, stage 2: Secondary | ICD-10-CM | POA: Diagnosis not present

## 2022-12-18 DIAGNOSIS — Z0000 Encounter for general adult medical examination without abnormal findings: Secondary | ICD-10-CM | POA: Diagnosis not present

## 2022-12-18 DIAGNOSIS — F01A0 Vascular dementia, mild, without behavioral disturbance, psychotic disturbance, mood disturbance, and anxiety: Secondary | ICD-10-CM | POA: Diagnosis not present

## 2022-12-18 DIAGNOSIS — B351 Tinea unguium: Secondary | ICD-10-CM | POA: Diagnosis not present

## 2022-12-18 DIAGNOSIS — G309 Alzheimer's disease, unspecified: Secondary | ICD-10-CM | POA: Diagnosis not present

## 2022-12-18 DIAGNOSIS — E785 Hyperlipidemia, unspecified: Secondary | ICD-10-CM | POA: Diagnosis not present

## 2022-12-18 DIAGNOSIS — E538 Deficiency of other specified B group vitamins: Secondary | ICD-10-CM | POA: Diagnosis not present

## 2022-12-18 DIAGNOSIS — E611 Iron deficiency: Secondary | ICD-10-CM | POA: Diagnosis not present

## 2022-12-18 DIAGNOSIS — F0280 Dementia in other diseases classified elsewhere without behavioral disturbance: Secondary | ICD-10-CM | POA: Diagnosis not present

## 2022-12-20 DIAGNOSIS — G309 Alzheimer's disease, unspecified: Secondary | ICD-10-CM | POA: Diagnosis not present

## 2022-12-20 DIAGNOSIS — L89322 Pressure ulcer of left buttock, stage 2: Secondary | ICD-10-CM | POA: Diagnosis not present

## 2022-12-20 DIAGNOSIS — J45909 Unspecified asthma, uncomplicated: Secondary | ICD-10-CM | POA: Diagnosis not present

## 2022-12-20 DIAGNOSIS — F0280 Dementia in other diseases classified elsewhere without behavioral disturbance: Secondary | ICD-10-CM | POA: Diagnosis not present

## 2022-12-20 DIAGNOSIS — F01A0 Vascular dementia, mild, without behavioral disturbance, psychotic disturbance, mood disturbance, and anxiety: Secondary | ICD-10-CM | POA: Diagnosis not present

## 2022-12-20 DIAGNOSIS — G214 Vascular parkinsonism: Secondary | ICD-10-CM | POA: Diagnosis not present

## 2022-12-22 DIAGNOSIS — B351 Tinea unguium: Secondary | ICD-10-CM | POA: Diagnosis not present

## 2022-12-22 DIAGNOSIS — E119 Type 2 diabetes mellitus without complications: Secondary | ICD-10-CM | POA: Diagnosis not present

## 2022-12-22 DIAGNOSIS — L600 Ingrowing nail: Secondary | ICD-10-CM | POA: Diagnosis not present

## 2022-12-22 DIAGNOSIS — M79675 Pain in left toe(s): Secondary | ICD-10-CM | POA: Diagnosis not present

## 2022-12-22 DIAGNOSIS — M79674 Pain in right toe(s): Secondary | ICD-10-CM | POA: Diagnosis not present

## 2022-12-25 DIAGNOSIS — G214 Vascular parkinsonism: Secondary | ICD-10-CM | POA: Diagnosis not present

## 2022-12-25 DIAGNOSIS — F01A0 Vascular dementia, mild, without behavioral disturbance, psychotic disturbance, mood disturbance, and anxiety: Secondary | ICD-10-CM | POA: Diagnosis not present

## 2022-12-25 DIAGNOSIS — F0280 Dementia in other diseases classified elsewhere without behavioral disturbance: Secondary | ICD-10-CM | POA: Diagnosis not present

## 2022-12-25 DIAGNOSIS — L89322 Pressure ulcer of left buttock, stage 2: Secondary | ICD-10-CM | POA: Diagnosis not present

## 2022-12-25 DIAGNOSIS — J45909 Unspecified asthma, uncomplicated: Secondary | ICD-10-CM | POA: Diagnosis not present

## 2022-12-25 DIAGNOSIS — G309 Alzheimer's disease, unspecified: Secondary | ICD-10-CM | POA: Diagnosis not present

## 2022-12-25 NOTE — Progress Notes (Signed)
LADERRICK, PINNOW ([id number]) [id number] Page 1 of 9 Visit Report for 12/05/2022 Chief Complaint Document Details Patient Name: Date of Service: Craig Sartorius MA S Vazquez. 12/05/2022 2:00 PM Medical Record Number: [id number] Patient Account Number: [id number] Date of Birth/Sex: Treating RN: 1931/02/12 (89 y.o. Craig Vazquez Primary Care Provider: [name] Other Clinician: Referring Provider: Treating Provider/Extender: [name]: 0 Information Obtained from: Patient Chief Complaint Pressure injury buttocks Electronic Signature(s) Signed: 12/05/2022 3:08:16 PM By: Allen Derry PA-C Entered By: Allen Derry on 12/05/2022 15:08:16 -------------------------------------------------------------------------------- HPI Details Patient Name: Date of Service: Craig Vazquez. 12/05/2022 2:00 PM Medical Record Number: [id number] Patient Account Number: [id number] Date of Birth/Sex: Treating RN: [birth year] (89 y.o. Craig Vazquez Primary Care Provider: [name] Other Clinician: Referring Provider: Treating Provider/Extender: [name]: 0 History of Present Illness HPI Description: 12-05-2022 upon evaluation today patient appears for initial evaluation here in the clinic concerning issues that he has been having with a area of irritation over the gluteal region bilaterally. He states that this has been going on for several weeks. And it has become a somewhat uncomfortable and painful issue for him. Fortunately there does not appear to be any signs of infection and there actually does not appear to be any open wound either. Nonetheless I believe that he does have a little bit of a pressure injury here although I think it is not even at a stage I at this point as he still is blanchable as far as the erythema is concerned. Nonetheless I do believe that he would benefit from a continuation of  therapy with regard to offloading and we can discuss that in detail today as well. Otherwise he does have a history of a stroke and in general he is actually doing decently well. Electronic Signature(s) Signed: 12/06/2022 4:48:18 PM By: Allen Derry PA-C Entered By: Allen Derry on 12/06/2022 16:48:18 Craig Vazquez ([id number]) [id number] Page 2 of 9 -------------------------------------------------------------------------------- Physical Exam Details Patient Name: Date of Service: Craig Sartorius MA S Vazquez. 12/05/2022 2:00 PM Medical Record Number: [id number] Patient Account Number: [id number] Date of Birth/Sex: Treating RN: 08/01/1930 (89 y.o. Craig Vazquez Primary Care Provider: [name] Other Clinician: Referring Provider: Treating Provider/Extender: [name]: 0 Constitutional sitting or standing blood pressure is within target range for patient.[name] respirations regular, non-labored and within target range for patient.[name] temperature within target range for patient.. Well-nourished and well-hydrated in no acute distress. Eyes conjunctiva clear no eyelid edema noted. pupils equal round and reactive to light and accommodation. Ears, Nose, Mouth, and Throat no gross abnormality of ear auricles or external auditory canals. normal hearing noted during conversation. mucus membranes moist. Respiratory normal breathing without difficulty. Musculoskeletal normal gait and posture. no significant deformity or arthritic changes, no loss or range of motion, no clubbing. Psychiatric this patient is able to make decisions and demonstrates good insight into disease process. Alert and Oriented x 3. pleasant and cooperative. Notes Upon inspection patient's wound area actually is not a wound at all but just this appears to be an area of irritation sure if it is fungal or just more due to pressure but not breaking down yet but nonetheless I  did have a long discussion with him today about the appropriate need for offloading and how getting up and moving around would be beneficial he is able to get up and move therefore  I think this is good to be his best help. He also needs to make sure not to sit for long periods of time. Especially longer than an hour I would recommend he get up once an hour and move around for at least 5 minutes and when he is up for allowed in the day sitting he should spend some time getting in bed and laying on his side to take pressure off as well.. I am not the patient voiced understanding when it comes to this and he did have a family member in the room with him today as well. Electronic Signature(s) Signed: 12/06/2022 4:49:09 PM By: Allen Derry PA-C Entered By: Allen Derry on 12/06/2022 16:49:08 -------------------------------------------------------------------------------- Physician Orders Details Patient Name: Date of Service: Craig Sartorius MA S Vazquez. 12/05/2022 2:00 PM Medical Record Number: [id number] Patient Account Number: [id number] Date of Birth/Sex: Treating RN: 1930-09-10 (89 y.o. 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 Home Health DISCONTINUE Home Health for Wound Care. - Discontinue Medi-honey and cover dressing. Bathing/ Shower/ Hygiene May shower; gently cleanse wound with antibacterial  soap, rinse and pat dry prior to dressing wounds Non-Wound Condition Cleanse affected area with antibacterial soap and water, dditional non-wound orders/instructions: - Apply combination Zinc oxide and nystatin cream twice daily. No bandage or bordered foam dressing. A Patient Medications llergies: No Known Allergies A Notifications Medication Indication Start End 12/05/2022 nystatin DOSE topical 100,000 unit/gram cream - cream topical mixed 1:1 with Desitin then applied to the affected gluteal region 2 times per day for 30 days 12/05/2022 Desitin DOSE topical 40 % paste - paste topical mixed 1:1 with nystatin cream then applied to the affected gluteal region 2 times per day for 30 days Electronic Signature(s) Signed: 12/05/2022 4:16:52 PM By: Elliot Gurney, BSN, RN, CWS, Kim RN, BSN Signed: 12/06/2022 5:03:24 PM By: Allen Derry PA-C Previous Signature: 12/05/2022 3:11:49 PM Version By: Allen Derry PA-C Entered By: Elliot Gurney BSN, RN, CWS, Kim on 12/05/2022 16:16:52 Prescription 12/05/2022 -------------------------------------------------------------------------------- Craig Ren PA-C Patient Name: Provider: Oct 03, 1930 [id number] Date of Birth: NPI#Judie Petit [id number] Sex: DEA #: [phone] Phone #: License #: UPN: Patient Address: [address] [facility] [place], [place] [address] [facility] [address], [address] [place], [place] [address] [phone] Allergies No Known Allergies Medication Medication: Route: Strength: Form: nystatin 100,000 unit/gram topical cream topical 100,000 unit/gram cream ClassCURRY, CONK ([id number]) [id number] Page 4 of 9 TOPICAL ANTIFUNGALS Dose: Frequency / Time: Indication: cream topical mixed 1:1 with Desitin then applied to the affected gluteal region 2 times per day for 30 days Number of Refills: Number of Units: 2 Sixty  (60) Generic Substitution: Start Date: End Date: One Time Use: Substitution Permitted 12/05/2022 No Note to Pharmacy: Hand Signature: Date(s): Prescription 12/05/2022 Craig Ren PA-C Patient Name: Provider: 06-12-1931 [id number] Date of Birth: NPI#: Judie Petit [id number] Sex: DEA #: [phone] Phone #: License #: UPN: Patient Address: [address] [facility]  [place], [place] [address] [facility] [address] [place], [place] [address] [phone] Allergies No Known Allergies Medication Medication: Route: Strength: Form: Desitin 40 % topical paste topical 40 % paste Class: PROTECTIVES Dose: Frequency / Time: Indication: paste topical mixed 1:1 with nystatin cream then applied to the affected gluteal region 2 times per day for 30 days Number of Refills: Number of Units: 2 Sixty (60) Generic Substitution: Start Date: End Date: One Time Use: Substitution Permitted 12/05/2022 No Note to Pharmacy: Hand Signature: Date(s): Electronic Signature(s) Signed: 12/06/2022 5:03:24 PM By: Allen Derry PA-C Signed: 12/07/2022 9:54:14 AM By: Elliot Gurney, BSN, RN, CWS, Kim RN, BSN Previous Signature: 12/05/2022 3:12:06 PM Version By: Allen Derry PA-C Entered By: Elliot Gurney BSN, RN, CWS, Kim on 12/05/2022 16:16:52 -------------------------------------------------------------------------------- Problem List Details Patient Name: Date of Service: Craig Sartorius MA S Vazquez. 12/05/2022 2:00 PM Medical Record Number: [id number] Patient Account Number: [id number] Date of Birth/Sex: Treating RN: Sep 27, 1930 (89 y.o. Craig Vazquez Primary Care Provider: [name] Other Clinician: Referring Provider: Treating Provider/Extender: [name]: [address] INDERPAL, MATKIN ([id number]) [id number] Page 5 of 9 ICD-10 Encounter Code Description Active Date  MDM Diagnosis L24.A9 Irritant contact dermatitis due friction or contact with other specified body 12/05/2022 No Yes fluids Z86.73 Personal history of transient ischemic attack (TIA), and cerebral infarction 12/05/2022 No Yes without residual deficits Inactive Problems Resolved Problems Electronic Signature(s) Signed: 12/06/2022 4:48:10 PM By: Allen Derry PA-C Previous Signature: 12/05/2022 2:42:19 PM Version By: Allen Derry PA-C Entered By: Allen Derry on 12/06/2022 16:48:09 -------------------------------------------------------------------------------- Progress Note Details Patient Name: Date of Service: Craig Vazquez. 12/05/2022 2:00 PM Medical Record Number: [id number] Patient Account Number: [id number] Date of Birth/Sex: Treating RN: [birth year] (89 y.o. Craig Vazquez Primary Care Provider: [name] Other Clinician: Referring Provider: Treating Provider/Extender: [name]: 0 Subjective Chief Complaint Information obtained from Patient Pressure injury buttocks History of Present Illness (HPI) 12-05-2022 upon evaluation today patient appears for initial evaluation here in the clinic concerning issues that he has been having with a area of irritation over the gluteal region bilaterally. He states that this has been going on for several weeks. And it has become a somewhat uncomfortable and painful issue for him. Fortunately there does not appear to be any signs of infection and there actually does not appear to be any open wound either. Nonetheless I believe that he does have a little bit of a pressure injury here although I think it is not even at a stage I at this point as he still is blanchable as far as the erythema is concerned. Nonetheless I do believe that he would benefit from a continuation of therapy with regard to offloading and we can discuss that in detail today as well. Otherwise he does have a history of a stroke and in  general he is actually doing decently well. Patient History Unable to Obtain Patient History due to Dementia. Information obtained from Patient, Caregiver. Allergies No Known Allergies Social History Never smoker, Marital Status - Widowed, Alcohol Use - Never, Drug Use - No History, Caffeine Use - Rarely. Medical History Respiratory Patient has history of Asthma Endocrine Denies history of Type I Diabetes, Type II Diabetes Review of Systems (ROS) Craig Vazquez, Craig Vazquez ([id number]) [id number] Page 6 of 9 Constitutional Symptoms (General Health) Denies complaints or symptoms of Fatigue, Fever, Chills, Marked Weight Change. Eyes Complains or has symptoms of  Glasses / Contacts. Ear/Nose/Mouth/Throat Denies complaints or symptoms of Difficult clearing ears, Sinusitis. Hematologic/Lymphatic Denies complaints or symptoms of Bleeding / Clotting Disorders, Human Immunodeficiency Virus. Cardiovascular Denies complaints or symptoms of Chest pain, LE edema. Gastrointestinal Denies complaints or symptoms of Frequent diarrhea, Nausea, Vomiting. Endocrine Denies complaints or symptoms of Hepatitis, Thyroid disease, Polydypsia (Excessive Thirst). Genitourinary Denies complaints or symptoms of Kidney failure/ Dialysis, Incontinence/dribbling. Immunological Denies complaints or symptoms of Hives, Itching. Integumentary (Skin) Denies complaints or symptoms of Wounds, Bleeding or bruising tendency, Breakdown, Swelling. Musculoskeletal Denies complaints or symptoms of Muscle Pain, Muscle Weakness. Neurologic Denies complaints or symptoms of Numbness/parasthesias, Focal/Weakness. Psychiatric Denies complaints or symptoms of Anxiety, Claustrophobia. Objective Constitutional sitting or standing blood pressure is within target range for patient.[name] respirations regular, non-labored and within target range for patient.[name] temperature within target range for patient..  Well-nourished and well-hydrated in no acute distress. Vitals Time Taken: 2:14 PM, Height: 68 in, Source: Stated, Weight: 118 lbs, Source: Stated, BMI: 17.9, Temperature: 98.0 F, Pulse: 70 bpm, Respiratory Rate: 16 breaths/min, Blood Pressure: 127/54 mmHg. Eyes conjunctiva clear no eyelid edema noted. pupils equal round and reactive to light and accommodation. Ears, Nose, Mouth, and Throat no gross abnormality of ear auricles or external auditory canals. normal hearing noted during conversation. mucus membranes moist. Respiratory normal breathing without difficulty. Musculoskeletal normal gait and posture. no significant deformity or arthritic changes, no loss or range of motion, no clubbing. Psychiatric this patient is able to make decisions and demonstrates good insight into disease process. Alert and Oriented x 3. pleasant and cooperative. General Notes: Upon inspection patient's wound area actually is not a wound at all but just this appears to be an area of irritation sure if it is fungal or just more due to pressure but not breaking down yet but nonetheless I did have a long discussion with him today about the appropriate need for offloading and how getting up and moving around would be beneficial he is able to get up and move therefore I think this is good to be his best help. He also needs to make sure not to sit for long periods of time. Especially longer than an hour I would recommend he get up once an hour and move around for at least 5 minutes and when he is up for allowed in the day sitting he should spend some time getting in bed and laying on his side to take pressure off as well.. I am not the patient voiced understanding when it comes to this and he did have a family member in the room with him today as well. Other Condition(s) Patient presents with Other Dermatologic Condition located on the Gluteus. General Notes: Blanchable redness to gluteal region Assessment Active  Problems ICD-10 Irritant contact dermatitis due friction or contact with other specified body fluids Personal history of transient ischemic attack (TIA), and cerebral infarction without residual deficits Plan Craig Vazquez, Craig Vazquez ([id number]) [id number] Page 7 of 9 Follow-up Appointments: Return Appointment in 1 week. Home Health: DISCONTINUE Home Health for Wound Care. - Discontinue Medi-honey and cover dressing. Bathing/ Shower/ Hygiene: May shower; gently cleanse wound with antibacterial soap, rinse and pat dry prior to dressing wounds Non-Wound Condition: Cleanse affected area with antibacterial soap and water, Additional non-wound orders/instructions: - Apply combination Zinc oxide and nystatin cream twice daily. No bandage or bordered foam dressing. The following medication(s) was prescribed: nystatin topical 100,000 unit/gram cream cream topical mixed 1:1 with Desitin then applied to the affected gluteal region 2  times per day for 30 days starting 12/05/2022 Desitin topical 40 % paste paste topical mixed 1:1 with nystatin cream then applied to the affected gluteal region 2 times per day for 30 days starting 12/05/2022 1. Based on what I am seeing I believe that he could have a pressure component to this it could also be fungal on the insurer either way we need to protect this area and I recommended a mixture of one-to-one zinc oxide and nystatin cream to be applied to the gluteal region for protection sake and if it is fungal this will help treat as well. 2. I am also can recommend that we have the patient proceed with appropriate offloading he needs to be very diligent about getting up and moving around and keeping pressure off I think this is going to be keep toward getting this healed and keeping it so. 3. I would also recommend that specifically when he is sitting for an extended period of time that he should get up at least for 5 minutes or so every hour  in order to move around and get some blood flow to the gluteal region and the patient voiced understanding. We will see patient back for reevaluation in 1 week here in the clinic. If anything worsens or changes patient will contact our office for additional recommendations. Electronic Signature(s) Signed: 12/06/2022 4:50:22 PM By: Allen Derry PA-C Entered By: Allen Derry on 12/06/2022 16:50:22 -------------------------------------------------------------------------------- ROS/PFSH Details Patient Name: Date of Service: Craig Vazquez. 12/05/2022 2:00 PM Medical Record Number: [id number] Patient Account Number: [id number] Date of Birth/Sex: Treating RN: 14-Apr-1931 (89 y.o. Craig Vazquez Primary Care Provider: [name] Other Clinician: Referring Provider: Treating Provider/Extender: [name]: 0 Unable to Obtain Patient History due to Dementia Information Obtained From Patient Caregiver Constitutional Symptoms (General Health) Complaints and Symptoms: Negative for: Fatigue; Fever; Chills; Marked Weight Change Eyes Complaints and Symptoms: Positive for: Glasses / Contacts Ear/Nose/Mouth/Throat Complaints and Symptoms: Negative for: Difficult clearing ears; Sinusitis Hematologic/Lymphatic Craig Vazquez, Craig Vazquez ([id number]) [id number] Page 8 of 9 Complaints and Symptoms: Negative for: Bleeding / Clotting Disorders; Human Immunodeficiency Virus Cardiovascular Complaints and Symptoms: Negative for: Chest pain; LE edema Gastrointestinal Complaints and Symptoms: Negative for: Frequent diarrhea; Nausea; Vomiting Endocrine Complaints and Symptoms: Negative for: Hepatitis; Thyroid disease; Polydypsia (Excessive Thirst) Medical History: Negative for: Type I Diabetes; Type II Diabetes Genitourinary Complaints and Symptoms: Negative for: Kidney failure/ Dialysis; Incontinence/dribbling Immunological Complaints and  Symptoms: Negative for: Hives; Itching Integumentary (Skin) Complaints and Symptoms: Negative for: Wounds; Bleeding or bruising tendency; Breakdown; Swelling Musculoskeletal Complaints and Symptoms: Negative for: Muscle Pain; Muscle Weakness Neurologic Complaints and Symptoms: Negative for: Numbness/parasthesias; Focal/Weakness Psychiatric Complaints and Symptoms: Negative for: Anxiety; Claustrophobia Respiratory Medical History: Positive for: Asthma Oncologic Immunizations Pneumococcal Vaccine: Received Pneumococcal Vaccination: Yes Received Pneumococcal Vaccination On or After 60th Birthday: Yes Implantable Devices None Family and Social History Never smoker; Marital Status - Widowed; Alcohol Use: Never; Drug Use: No History; Caffeine Use: Rarely Electronic Signature(s) Signed: 12/06/2022 5:03:24 PM By: Allen Derry PA-C Signed: 12/14/2022 11:10:44 AM By: Midge Aver MSN RN CNS WTA Entered By: Midge Aver on 12/05/2022 14:18:11 Craig Vazquez ([id number]) [id number] Page 9 of 9 -------------------------------------------------------------------------------- SuperBill Details Patient Name: Date of Service: Craig Vazquez [place] 12/05/2022 Medical Record Number: [id number] Patient Account Number: [id number] Date of Birth/Sex: Treating RN: May 14, 1931 (89 y.o. 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.73 Personal history of transient ischemic attack (TIA), and cerebral infarction without residual deficits Facility Procedures : CPT4 Code: [id number] Description: 99213 - WOUND CARE VISIT-LEV 3 EST  PT Modifier: Quantity: 1 Physician Procedures : CPT4 Code Description Modifier [id number] - WC PHYS LEVEL 4 - NEW PT ICD-10 Diagnosis Description L24.A9 Irritant contact dermatitis due friction or contact with other specified body fluids L98.411 Non-pressure chronic ulcer of buttock limited to  breakdown of skin Z86.73 Personal history of transient ischemic attack (TIA), and cerebral infarction without residual deficits Quantity: 1 Electronic Signature(s) Signed: 12/06/2022 4:50:34 PM By: Allen Derry PA-C Entered By: Allen Derry on 12/06/2022 16:50:34

## 2022-12-26 DIAGNOSIS — J45909 Unspecified asthma, uncomplicated: Secondary | ICD-10-CM | POA: Diagnosis not present

## 2022-12-26 DIAGNOSIS — F01A0 Vascular dementia, mild, without behavioral disturbance, psychotic disturbance, mood disturbance, and anxiety: Secondary | ICD-10-CM | POA: Diagnosis not present

## 2022-12-26 DIAGNOSIS — G309 Alzheimer's disease, unspecified: Secondary | ICD-10-CM | POA: Diagnosis not present

## 2022-12-26 DIAGNOSIS — F0280 Dementia in other diseases classified elsewhere without behavioral disturbance: Secondary | ICD-10-CM | POA: Diagnosis not present

## 2022-12-26 DIAGNOSIS — L89322 Pressure ulcer of left buttock, stage 2: Secondary | ICD-10-CM | POA: Diagnosis not present

## 2022-12-26 DIAGNOSIS — G214 Vascular parkinsonism: Secondary | ICD-10-CM | POA: Diagnosis not present

## 2022-12-28 ENCOUNTER — Encounter: Payer: Medicare Other | Admitting: Physician Assistant

## 2022-12-28 DIAGNOSIS — L24A9 Irritant contact dermatitis due friction or contact with other specified body fluids: Secondary | ICD-10-CM | POA: Diagnosis not present

## 2022-12-28 DIAGNOSIS — Z8673 Personal history of transient ischemic attack (TIA), and cerebral infarction without residual deficits: Secondary | ICD-10-CM | POA: Diagnosis not present

## 2022-12-28 DIAGNOSIS — L98411 Non-pressure chronic ulcer of buttock limited to breakdown of skin: Secondary | ICD-10-CM | POA: Diagnosis not present

## 2022-12-28 DIAGNOSIS — L89309 Pressure ulcer of unspecified buttock, unspecified stage: Secondary | ICD-10-CM | POA: Diagnosis not present

## 2022-12-28 NOTE — Progress Notes (Addendum)
ROBIE, VITITOE ([id number]) [id number] Page 1 of 5 Visit Report for 12/28/2022 Chief Complaint Document Details Patient Name: Date of Service: Craig Sartorius MA Netty Starring 12/28/2022 3:00 PM Medical Record Number: [id number] Patient Account Number: [id number] Date of Birth/Sex: Treating RN: Oct 24, 1930 (89 y.o. Craig Vazquez Primary Care Provider: [name] Other Clinician: Referring Provider: Treating Provider/Extender: [name]: 3 Information Obtained from: Patient Chief Complaint Pressure injury buttocks Electronic Signature(s) Signed: 12/28/2022 3:08:50 PM By: Allen Derry PA-C Entered By: Allen Derry on 12/28/2022 15:08:50 -------------------------------------------------------------------------------- HPI Details Patient Name: Date of Service: Craig Geryl Councilman MA S E. 12/28/2022 3:00 PM Medical Record Number: [id number] Patient Account Number: [id number] Date of Birth/Sex: Treating RN: 12-27-1930 (89 y.o. Craig Vazquez Primary Care Provider: [name] Other Clinician: Referring Provider: Treating Provider/Extender: [name]: 3 History of Present Illness HPI Description: 12-05-2022 upon evaluation today patient appears for initial evaluation here in the clinic concerning issues that he has been having with a area of irritation over the gluteal region bilaterally. He states that this has been going on for several weeks. And it has become a somewhat uncomfortable and painful issue for him. Fortunately there does not appear to be any signs of infection and there actually does not appear to be any open wound either. Nonetheless I believe that he does have a little bit of a pressure injury here although I think it is not even at a stage I at this point as he still is blanchable as far as the erythema is concerned. Nonetheless I do believe that he would benefit from a continuation of  therapy with regard to offloading and we can discuss that in detail today as well. Otherwise he does have a history of a stroke and in general he is actually doing decently well. 12-14-2022 upon evaluation today patient appears to be doing well currently in regard to his gluteal region. In fact there is no wound still and he has been doing well with the cream other than the fact that it was drying and sticking to his brief which was causing him discomfort. Other than that I think things have been doing quite well. 12-28-2022 upon evaluation patient's sacral area actually appears to be doing quite well. He has been using a mixture of nystatin and zinc and this seems to be doing excellent at this point. I am extremely pleased with where things stand he will be moving home at the beginning of July. This will be with his daughter. Electronic Signature(s) Craig Vazquez, Craig Vazquez ([id number]) [id number] Page 2 of 5 Signed: 12/28/2022 5:24:53 PM By: Allen Derry PA-C Entered By: Allen Derry on 12/28/2022 17:24:53 -------------------------------------------------------------------------------- Physical Exam Details Patient Name: Date of Service: Craig Sartorius MA S E. 12/28/2022 3:00 PM Medical Record Number: [id number] Patient Account Number: [id number] Date of Birth/Sex: Treating RN: Apr 12, 1931 (89 y.o. Craig Vazquez Primary Care Provider: [name] Other Clinician: Referring Provider: Treating Provider/Extender: [name]: 3 Constitutional Well-nourished and well-hydrated in no acute distress. Respiratory normal breathing without difficulty. Psychiatric this patient is able to make decisions and demonstrates good insight into disease process. Alert and Oriented x 3. pleasant and cooperative. Notes Upon inspection patient's sacral area actually appears to be doing quite well the tissue is much healthier and does not appear to be too  irritating honestly I think at this point is just a matter of continuing with the  plan as before and they are in agreement with the plan once the tissue turns back into a more normal color they can just proceed at that point with using the zinc as needed for protection. Electronic Signature(s) Signed: 12/28/2022 5:25:33 PM By: Allen Derry PA-C Entered By: Allen Derry on 12/28/2022 17:25:32 -------------------------------------------------------------------------------- Physician Orders Details Patient Name: Date of Service: Craig Geryl Councilman MA S E. 12/28/2022 3:00 PM Medical Record Number: [id number] Patient Account Number: [id number] Date of Birth/Sex: Treating RN: 1931-03-16 (89 y.o. Craig Vazquez Primary Care Provider: [name] Other Clinician: Referring Provider: Treating Provider/Extender: Winfred Leeds in Treatment: 3 Verbal / Phone Orders: No Diagnosis Coding ICD-10 Coding Code Description L24.A9 Irritant contact dermatitis due friction or contact with other specified body fluids Z86.73 Personal history of transient ischemic attack (TIA), and cerebral infarction without residual deficits Craig Vazquez, Craig Vazquez ([id number]) [id number] Page 3 of 5 Discharge From [facility] Services Discharge from [facility] Treatment Complete - please call with any issues to healed area. You may apply zinc, mixed with nystatin and when he is home you may apply destin only. Electronic Signature(s) Signed: 12/28/2022 4:16:40 PM By: [name] Signed: 12/28/2022 5:52:14 PM By: Allen Derry PA-C Entered By: [name] on 12/28/2022 16:05:46 -------------------------------------------------------------------------------- Problem List Details Patient Name: Date of Service: Craig Sartorius MA S E. 12/28/2022 3:00 PM Medical Record Number: [id number] Patient Account Number: [id number] Date of Birth/Sex: Treating RN: [birth year] (89 y.o. Craig Vazquez Primary Care Provider: [name] Other Clinician: Referring Provider: Treating Provider/Extender: [name]: 3 Active Problems ICD-10 Encounter Code Description Active Date MDM Diagnosis L24.A9 Irritant contact dermatitis due friction or contact with other specified body 12/05/2022 No Yes fluids Z86.73 Personal history of transient ischemic attack (TIA), and cerebral infarction 12/05/2022 No Yes without residual deficits Inactive Problems Resolved Problems Electronic Signature(s) Signed: 12/28/2022 3:08:46 PM By: Allen Derry PA-C Entered By: Allen Derry on 12/28/2022 15:08:46 -------------------------------------------------------------------------------- Progress Note Details Patient Name: Date of Service: Craig Geryl Councilman MA S E. 12/28/2022 3:00 PM Medical Record Number: [id number] Patient Account Number: [id number] Craig Vazquez, Craig Vazquez ([id number]) [id number] Page 4 of 5 Date of Birth/Sex: Treating RN: 07/28/1930 (89 y.o. Craig Vazquez Primary Care Provider: Other Clinician: [name] Referring Provider: Treating Provider/Extender: [name]: 3 Subjective Chief Complaint Information obtained from Patient Pressure injury buttocks History of Present Illness (HPI) 12-05-2022 upon evaluation today patient appears for initial evaluation here in the clinic concerning issues that he has been having with a area of irritation over the gluteal region bilaterally. He states that this has been going on for several weeks. And it has become a somewhat uncomfortable and painful issue for him. Fortunately there does not appear to be any signs of infection and there actually does not appear to be any open wound either. Nonetheless I believe that he does have a little bit of a pressure injury here although I think it is not even at a stage I at this point as he still is blanchable as  far as the erythema is concerned. Nonetheless I do believe that he would benefit from a continuation of therapy with regard to offloading and we can discuss that in detail today as well. Otherwise he does have a history of a stroke and in general he is actually doing decently well. 12-14-2022 upon evaluation today patient appears to be doing well currently in regard to his gluteal  region. In fact there is no wound still and he has been doing well with the cream other than the fact that it was drying and sticking to his brief which was causing him discomfort. Other than that I think things have been doing quite well. 12-28-2022 upon evaluation patient's sacral area actually appears to be doing quite well. He has been using a mixture of nystatin and zinc and this seems to be doing excellent at this point. I am extremely pleased with where things stand he will be moving home at the beginning of July. This will be with his daughter. Objective Constitutional Well-nourished and well-hydrated in no acute distress. Vitals Time Taken: 3:14 PM, Height: 68 in, Weight: 118 lbs, BMI: 17.9, Temperature: 97.5 F, Pulse: 76 bpm, Respiratory Rate: 18 breaths/min, Blood Pressure: 120/58 mmHg. Respiratory normal breathing without difficulty. Psychiatric this patient is able to make decisions and demonstrates good insight into disease process. Alert and Oriented x 3. pleasant and cooperative. General Notes: Upon inspection patient's sacral area actually appears to be doing quite well the tissue is much healthier and does not appear to be too irritating honestly I think at this point is just a matter of continuing with the plan as before and they are in agreement with the plan once the tissue turns back into a more normal color they can just proceed at that point with using the zinc as needed for protection. Other Condition(s) Patient presents with Other Dermatologic Condition located on the Gluteus. General Notes:  area is red but dry no open areas noted Assessment Active Problems ICD-10 Irritant contact dermatitis due friction or contact with other specified body fluids Personal history of transient ischemic attack (TIA), and cerebral infarction without residual deficits Plan [facility]charge [facility] Services: Discharge from [facility] Center Treatment Complete - please call with any issues to healed area. You may apply zinc, mixed with nystatin and when he is home you may apply destin only. 1. I would recommend that we have the patient continue to utilize the mixture of zinc and nystatin to the sacral area this seems to be doing quite well. 2. Also can recommend that they should continue with appropriate offloading avoiding pressure excessively to the area which I think does seem to be doing Craig Vazquez, Craig Vazquez ([id number]) [id number] Page 5 of 5 quite well. We will see the patient back for follow-up visit as needed. Electronic Signature(s) Signed: 12/28/2022 5:26:07 PM By: Allen Derry PA-C Entered By: Allen Derry on 12/28/2022 17:26:07 -------------------------------------------------------------------------------- SuperBill Details Patient Name: Date of Service: Craig Geryl Councilman MA S E. 12/28/2022 Medical Record Number: [id number] Patient Account Number: [id number] Date of Birth/Sex: Treating RN: [birth year] (89 y.o. Craig Vazquez Primary Care Provider: [name] Other Clinician: Referring Provider: Treating Provider/Extender: [name]: 3 Diagnosis Coding ICD-10 Codes Code Description L24.A9 Irritant contact dermatitis due friction or contact with other specified body fluids Z86.73 Personal history of transient ischemic attack (TIA), and cerebral infarction without residual deficits Facility Procedures : CPT4 Code: [id number] Description: 30865 - WOUND CARE VISIT-LEV 2 EST PT Modifier: Quantity: 1 Physician Procedures : CPT4  Code Description Modifier [id number] - WC PHYS LEVEL 3 - EST PT ICD-10 Diagnosis Description L24.A9 Irritant contact dermatitis due friction or contact with other specified body fluids Z86.73 Personal history of transient ischemic attack (TIA),  and cerebral infarction without residual deficits Quantity: 1 Electronic Signature(s) Signed: 12/28/2022 5:27:01 PM By: Allen Derry PA-C Previous Signature: 12/28/2022 4:16:40 PM Version  By: [name] Entered By: Allen Derry on 12/28/2022 17:27:00

## 2022-12-28 NOTE — Progress Notes (Signed)
AUGUSTER, EFTINK ([id number]) [id number] Page 1 of 7 Visit Report for 12/28/2022 Arrival Information Details Patient Name: Date of Service: Craig Sartorius MA S Vazquez. 12/28/2022 3:00 PM Medical Record Number: [id number] Patient Account Number: [id number] Date of Birth/Sex: Treating RN: 08/03/1930 (89 y.o. Craig Vazquez Primary Care Craig Vazquez: Craig Vazquez Other Clinician: Referring Craig Vazquez: Treating Craig Vazquez/Extender: Craig Vazquez Weeks in Treatment: 3 Visit Information History Since Last Visit Added or deleted any medications: No Patient Arrived: Craig Vazquez Any new allergies or adverse reactions: No Arrival Time: 15:10 Had a fall or experienced change in No Accompanied By: daughter activities of daily living that may affect Transfer Assistance: EasyPivot Patient Lift risk of falls: Patient Identification Verified: Yes Hospitalized since last visit: No Secondary Verification Process Completed: Yes Has Dressing in Place as Prescribed: No Patient Requires Transmission-Based Precautions: No Pain Present Now: No Patient Has Alerts: Yes Patient Alerts: Not diabetic ASA 81 mg Electronic Signature(s) Signed: 12/28/2022 4:16:40 PM By: [name] Entered By: [name] on 12/28/2022 15:14:18 -------------------------------------------------------------------------------- Clinic Level of Care Assessment Details Patient Name: Date of Service: Craig Sartorius MA S Vazquez. 12/28/2022 3:00 PM Medical Record Number: [id number] Patient Account Number: [id number] Date of Birth/Sex: Treating RN: [birth year] (89 y.o. Craig Vazquez Primary Care [place] List: Craig Vazquez Other Clinician: Referring Craig Vazquez: Treating Craig Vazquez/Extender: Craig Vazquez Weeks in Treatment: 3 Clinic Level of Care Assessment Items TOOL 4 Quantity Score []  - 0 Use when only an EandM is performed on FOLLOW-UP visit ASSESSMENTS - Nursing Assessment / Reassessment X-  1 10 Reassessment of Co-morbidities (includes updates in patient status) X- 1 5 Reassessment of Adherence to Treatment Plan ASSESSMENTS - Wound and Skin A ssessment / Reassessment []  - 0 Simple Wound Assessment / Reassessment - one wound Craig Vazquez ([id number]) [id number] Page 2 of 7 []  - 0 Complex Wound Assessment / Reassessment - multiple wounds X- 1 10 Dermatologic / Skin Assessment (not related to wound area) ASSESSMENTS - Focused Assessment []  - 0 Circumferential Edema Measurements - multi extremities []  - 0 Nutritional Assessment / Counseling / Intervention []  - 0 Lower Extremity Assessment (monofilament, tuning fork, pulses) []  - 0 Peripheral Arterial Disease Assessment (using hand held doppler) ASSESSMENTS - Ostomy and/or Continence Assessment and Care []  - 0 Incontinence Assessment and Management []  - 0 Ostomy Care Assessment and Management (repouching, etc.) PROCESS - Coordination of Care X - Simple Patient / Family Education for ongoing care 1 15 []  - 0 Complex (extensive) Patient / Family Education for ongoing care X- 1 10 Staff obtains Chiropractor, Records, T Results / Process Orders est []  - 0 Staff telephones HHA, Nursing Homes / Clarify orders / etc []  - 0 Routine Transfer to another Facility (non-emergent condition) []  - 0 Routine Hospital Admission (non-emergent condition) []  - 0 New Admissions / Manufacturing engineer / Ordering NPWT Apligraf, etc. , []  - 0 Emergency Hospital Admission (emergent condition) X- 1 10 Simple Discharge Coordination []  - 0 Complex (extensive) Discharge Coordination PROCESS - Special Needs []  - 0 Pediatric / Minor Patient Management []  - 0 Isolation Patient Management []  - 0 Hearing / Language / Visual special needs []  - 0 Assessment of Community assistance (transportation, D/C planning, etc.) []  - 0 Additional assistance / Altered mentation []  - 0 Support Surface(s) Assessment  (bed, cushion, seat, etc.) INTERVENTIONS - Wound Cleansing / Measurement []  - 0 Simple Wound Cleansing - one wound []  - 0 Complex Wound Cleansing - multiple wounds X- 1 5  Wound Imaging (photographs - any number of wounds) []  - 0 Wound Tracing (instead of photographs) []  - 0 Simple Wound Measurement - one wound []  - 0 Complex Wound Measurement - multiple wounds INTERVENTIONS - Wound Dressings []  - 0 Small Wound Dressing one or multiple wounds []  - 0 Medium Wound Dressing one or multiple wounds []  - 0 Large Wound Dressing one or multiple wounds []  - 0 Application of Medications - topical []  - 0 Application of Medications - injection INTERVENTIONS - Miscellaneous []  - 0 External ear exam []  - 0 Specimen Collection (cultures, biopsies, blood, body fluids, etc.) []  - 0 Specimen(s) / Culture(s) sent or taken to Lab for analysis Craig Vazquez ([id number]) [id number] Page 3 of 7 []  - 0 Patient Transfer (multiple staff / Nurse, adult / Similar devices) []  - 0 Simple Staple / Suture removal (25 or less) []  - 0 Complex Staple / Suture removal (26 or more) []  - 0 Hypo / Hyperglycemic Management (close monitor of Blood Glucose) []  - 0 Ankle / Brachial Index (ABI) - do not check if billed separately X- 1 5 Vital Signs Has the patient been seen at the hospital within the last three years: Yes Total Score: 70 Level Of Care: New/Established - Level 2 Electronic Signature(s) Signed: 12/28/2022 4:16:40 PM By: [name] Entered By: [name] on 12/28/2022 16:14:45 -------------------------------------------------------------------------------- Encounter Discharge Information Details Patient Name: Date of Service: Craig Sartorius MA S Vazquez. 12/28/2022 3:00 PM Medical Record Number: [id number] Patient Account Number: [id number] Date of Birth/Sex: Treating RN: 01-10-1931 (89 y.o. Craig Vazquez Primary Care Craig Vazquez: Craig Vazquez Other  Clinician: Referring Craig Vazquez: Treating Craig Vazquez/Extender: Craig Vazquez in Treatment: 3 Encounter Discharge Information Items Discharge Condition: Stable Ambulatory Status: Walker Discharge Destination: Other (Note Required) Telephoned: No Orders Sent: No Transportation: Private Auto Accompanied By: daughter Schedule Follow-up Appointment: No Clinical Summary of Care: Electronic Signature(s) Signed: 12/28/2022 4:16:40 PM By: [name] Entered By: [name] on 12/28/2022 16:15:58 -------------------------------------------------------------------------------- Lower Extremity Assessment Details Patient Name: Date of Service: Craig Sartorius MA S Vazquez. 12/28/2022 3:00 PM Medical Record Number: [id number] Patient Account Number: [id number] HONOUR, ZIEL ([id number]) [id number] Page 4 of 7 Date of Birth/Sex: Treating RN: [birth year] (89 y.o. Craig Vazquez Primary Care Charletta Voight: Other Clinician: Georgann Vazquez Referring Zaylee Cornia: Treating Abdirizak Richison/Extender: Craig Vazquez Weeks in Treatment: 3 Electronic Signature(s) Signed: 12/28/2022 4:16:40 PM By: [name] Entered By: [name] on 12/28/2022 15:16:29 -------------------------------------------------------------------------------- Multi Wound Chart Details Patient Name: Date of Service: Craig Sartorius MA S Vazquez. 12/28/2022 3:00 PM Medical Record Number: [id number] Patient Account Number: [id number] Date of Birth/Sex: Treating RN: February 16, 1931 (89 y.o. Craig Vazquez Primary Care Breyanna Valera: Craig Vazquez Other Clinician: Referring Tell Rozelle: Treating Yousra Ivens/Extender: Craig Vazquez Weeks in Treatment: 3 Vital Signs Height(in): 68 Pulse(bpm): 76 Weight(lbs): 118 Blood Pressure(mmHg): 120/58 Body Mass Index(BMI): 17.9 Temperature(F): 97.5 Respiratory Rate(breaths/min): 18 [Treatment Notes:Wound Assessments Treatment Notes] Electronic  Signature(s) Signed: 12/28/2022 4:18:34 PM By: [name] Previous Signature: 12/28/2022 4:16:40 PM Version By: [name] Entered By: [name] on 12/28/2022 16:18:34 -------------------------------------------------------------------------------- Multi-Disciplinary Care Plan Details Patient Name: Date of Service: Craig Sartorius MA S Vazquez. 12/28/2022 3:00 PM Medical Record Number: [id number] Patient Account Number: [id number] Date of Birth/Sex: Treating RN: [birth year] (89 y.o. Craig Vazquez Primary Care Maurisa Tesmer: Craig Vazquez Other Clinician: Referring Khyler Eschmann: Treating Solei Wubben/Extender: Craig Vazquez Weeks in Treatment: [address] JAYKUB, PARSA ([id number]) [id number] Page  5 of 7 Electronic Signature(s) Signed: 12/28/2022 4:16:40 PM By: [name] Entered By: [name] on 12/28/2022 16:14:58 -------------------------------------------------------------------------------- Non-Wound Condition Assessment Details Patient Name: Date of Service: Craig Sartorius MA S Vazquez. 12/28/2022 3:00 PM Medical Record Number: [id number] Patient Account Number: [id number] Date of Birth/Sex: Treating RN: 09-16-1930 (89 y.o. Craig Vazquez Primary Care Christorpher Hisaw: Craig Vazquez Other Clinician: Referring Bernardina Cacho: Treating Jeanmarie Mccowen/Extender: Craig Vazquez Weeks in Treatment: 3 Non-Wound Condition: Condition: Other Dermatologic Condition Location: Gluteus Side: Notes: midline gluteal region Photos Notes area is red but dry no open areas noted Electronic Signature(s) Signed: 12/28/2022 4:16:40 PM By: [name] Entered By: [name] on 12/28/2022 15:23:17 -------------------------------------------------------------------------------- Pain Assessment Details Patient Name: Date of Service: Craig Sartorius MA S Vazquez. 12/28/2022 3:00 PM Medical Record Number: [id number] Patient Account Number: [id number] Date of  Birth/Sex: Treating RN: August 09, 1930 (89 y.o. Craig Vazquez [place], [place] Vazquez ([id number]) [id number] Page 6 of 7 Primary Care Dyland Panuco: Craig Vazquez Other Clinician: Referring Guillermo Difrancesco: Treating Tiffny Gemmer/Extender: Craig Vazquez Weeks in Treatment: 3 Active Problems Location of Pain Severity and Description of Pain Patient Has Paino No Site Locations Rate the pain. Current Pain Level: 0 Pain Management and Medication Current Pain Management: Electronic Signature(s) Signed: 12/28/2022 4:16:40 PM By: [name] Entered By: [name] on 12/28/2022 15:16:21 -------------------------------------------------------------------------------- Patient/Caregiver Education Details Patient Name: Date of Service: Craig Sartorius MA S Vazquez. 6/20/2024andnbsp3:00 PM Medical Record Number: [id number] Patient Account Number: [id number] Date of Birth/Gender: Treating RN: August 24, 1930 (89 y.o. Craig Vazquez Primary Care Physician: Craig Vazquez Other Clinician: Referring Physician: Treating Physician/Extender: Craig Vazquez in Treatment: 3 Education Assessment Education Provided To: Patient and Caregiver Education Topics Provided Wound/Skin Impairment: Handouts: Other: care of heled wound Methods: Explain/Verbal Responses: State content correctly Electronic Signature(s) Signed: 12/28/2022 4:16:40 PM By: Yevonne Pax, Marya Landry ([id number]) [id number] Page 7 of 7 Entered By: [name] on 12/28/2022 16:15:16 -------------------------------------------------------------------------------- Vitals Details Patient Name: Date of Service: Craig Sartorius MA S Vazquez. 12/28/2022 3:00 PM Medical Record Number: [id number] Patient Account Number: [id number] Date of Birth/Sex: Treating RN: [birth year] (89 y.o. Craig Vazquez Primary Care Ashantae Pangallo: Craig Vazquez Other Clinician: Referring  Domitila Stetler: Treating Raye Slyter/Extender: Craig Vazquez Weeks in Treatment: 3 Vital Signs Time Taken: 15:14 Temperature (F): 97.5 Height (in): 68 Pulse (bpm): 76 Weight (lbs): 118 Respiratory Rate (breaths/min): 18 Body Mass Index (BMI): 17.9 Blood Pressure (mmHg): 120/58 Reference Range: 80 - 120 mg / dl Electronic Signature(s) Signed: 12/28/2022 4:16:40 PM By: [name] Entered By: [name] on 12/28/2022 15:16:16

## 2023-01-03 DIAGNOSIS — G20A1 Parkinson's disease without dyskinesia, without mention of fluctuations: Secondary | ICD-10-CM | POA: Diagnosis not present

## 2023-01-03 DIAGNOSIS — F0280 Dementia in other diseases classified elsewhere without behavioral disturbance: Secondary | ICD-10-CM | POA: Diagnosis not present

## 2023-01-03 DIAGNOSIS — M6281 Muscle weakness (generalized): Secondary | ICD-10-CM | POA: Diagnosis not present

## 2023-01-03 DIAGNOSIS — G214 Vascular parkinsonism: Secondary | ICD-10-CM | POA: Diagnosis not present

## 2023-01-03 DIAGNOSIS — F0150 Vascular dementia without behavioral disturbance: Secondary | ICD-10-CM | POA: Diagnosis not present

## 2023-01-03 DIAGNOSIS — J45909 Unspecified asthma, uncomplicated: Secondary | ICD-10-CM | POA: Diagnosis not present

## 2023-01-03 DIAGNOSIS — F01A0 Vascular dementia, mild, without behavioral disturbance, psychotic disturbance, mood disturbance, and anxiety: Secondary | ICD-10-CM | POA: Diagnosis not present

## 2023-01-03 DIAGNOSIS — E7800 Pure hypercholesterolemia, unspecified: Secondary | ICD-10-CM | POA: Diagnosis not present

## 2023-01-03 DIAGNOSIS — E46 Unspecified protein-calorie malnutrition: Secondary | ICD-10-CM | POA: Diagnosis not present

## 2023-01-03 DIAGNOSIS — L89322 Pressure ulcer of left buttock, stage 2: Secondary | ICD-10-CM | POA: Diagnosis not present

## 2023-01-03 DIAGNOSIS — G309 Alzheimer's disease, unspecified: Secondary | ICD-10-CM | POA: Diagnosis not present

## 2023-01-03 DIAGNOSIS — I679 Cerebrovascular disease, unspecified: Secondary | ICD-10-CM | POA: Diagnosis not present

## 2023-01-05 DIAGNOSIS — F0280 Dementia in other diseases classified elsewhere without behavioral disturbance: Secondary | ICD-10-CM | POA: Diagnosis not present

## 2023-01-05 DIAGNOSIS — G309 Alzheimer's disease, unspecified: Secondary | ICD-10-CM | POA: Diagnosis not present

## 2023-01-05 DIAGNOSIS — Z7982 Long term (current) use of aspirin: Secondary | ICD-10-CM | POA: Diagnosis not present

## 2023-01-05 DIAGNOSIS — L89322 Pressure ulcer of left buttock, stage 2: Secondary | ICD-10-CM | POA: Diagnosis not present

## 2023-01-05 DIAGNOSIS — Z8673 Personal history of transient ischemic attack (TIA), and cerebral infarction without residual deficits: Secondary | ICD-10-CM | POA: Diagnosis not present

## 2023-01-05 DIAGNOSIS — J45909 Unspecified asthma, uncomplicated: Secondary | ICD-10-CM | POA: Diagnosis not present

## 2023-01-05 DIAGNOSIS — G214 Vascular parkinsonism: Secondary | ICD-10-CM | POA: Diagnosis not present

## 2023-01-05 DIAGNOSIS — N183 Chronic kidney disease, stage 3 unspecified: Secondary | ICD-10-CM | POA: Diagnosis not present

## 2023-01-05 DIAGNOSIS — Z7951 Long term (current) use of inhaled steroids: Secondary | ICD-10-CM | POA: Diagnosis not present

## 2023-01-05 DIAGNOSIS — D509 Iron deficiency anemia, unspecified: Secondary | ICD-10-CM | POA: Diagnosis not present

## 2023-01-05 DIAGNOSIS — M6281 Muscle weakness (generalized): Secondary | ICD-10-CM | POA: Diagnosis not present

## 2023-01-05 DIAGNOSIS — F01A0 Vascular dementia, mild, without behavioral disturbance, psychotic disturbance, mood disturbance, and anxiety: Secondary | ICD-10-CM | POA: Diagnosis not present

## 2023-01-05 DIAGNOSIS — F341 Dysthymic disorder: Secondary | ICD-10-CM | POA: Diagnosis not present

## 2023-01-12 DIAGNOSIS — J45909 Unspecified asthma, uncomplicated: Secondary | ICD-10-CM | POA: Diagnosis not present

## 2023-01-12 DIAGNOSIS — G214 Vascular parkinsonism: Secondary | ICD-10-CM | POA: Diagnosis not present

## 2023-01-12 DIAGNOSIS — L89322 Pressure ulcer of left buttock, stage 2: Secondary | ICD-10-CM | POA: Diagnosis not present

## 2023-01-12 DIAGNOSIS — F01A0 Vascular dementia, mild, without behavioral disturbance, psychotic disturbance, mood disturbance, and anxiety: Secondary | ICD-10-CM | POA: Diagnosis not present

## 2023-01-12 DIAGNOSIS — F0280 Dementia in other diseases classified elsewhere without behavioral disturbance: Secondary | ICD-10-CM | POA: Diagnosis not present

## 2023-01-12 DIAGNOSIS — G309 Alzheimer's disease, unspecified: Secondary | ICD-10-CM | POA: Diagnosis not present

## 2023-01-15 DIAGNOSIS — F331 Major depressive disorder, recurrent, moderate: Secondary | ICD-10-CM | POA: Diagnosis not present

## 2023-01-15 DIAGNOSIS — F5105 Insomnia due to other mental disorder: Secondary | ICD-10-CM | POA: Diagnosis not present

## 2023-01-15 DIAGNOSIS — I69315 Cognitive social or emotional deficit following cerebral infarction: Secondary | ICD-10-CM | POA: Diagnosis not present

## 2023-01-15 DIAGNOSIS — F411 Generalized anxiety disorder: Secondary | ICD-10-CM | POA: Diagnosis not present

## 2023-01-15 DIAGNOSIS — G309 Alzheimer's disease, unspecified: Secondary | ICD-10-CM | POA: Diagnosis not present

## 2023-01-24 DIAGNOSIS — F0280 Dementia in other diseases classified elsewhere without behavioral disturbance: Secondary | ICD-10-CM | POA: Diagnosis not present

## 2023-01-24 DIAGNOSIS — G309 Alzheimer's disease, unspecified: Secondary | ICD-10-CM | POA: Diagnosis not present

## 2023-01-24 DIAGNOSIS — G214 Vascular parkinsonism: Secondary | ICD-10-CM | POA: Diagnosis not present

## 2023-01-24 DIAGNOSIS — L89322 Pressure ulcer of left buttock, stage 2: Secondary | ICD-10-CM | POA: Diagnosis not present

## 2023-01-24 DIAGNOSIS — F01A0 Vascular dementia, mild, without behavioral disturbance, psychotic disturbance, mood disturbance, and anxiety: Secondary | ICD-10-CM | POA: Diagnosis not present

## 2023-01-24 DIAGNOSIS — J45909 Unspecified asthma, uncomplicated: Secondary | ICD-10-CM | POA: Diagnosis not present

## 2023-01-31 DIAGNOSIS — Z79899 Other long term (current) drug therapy: Secondary | ICD-10-CM | POA: Diagnosis not present

## 2023-01-31 DIAGNOSIS — I129 Hypertensive chronic kidney disease with stage 1 through stage 4 chronic kidney disease, or unspecified chronic kidney disease: Secondary | ICD-10-CM | POA: Diagnosis not present

## 2023-01-31 DIAGNOSIS — N1831 Chronic kidney disease, stage 3a: Secondary | ICD-10-CM | POA: Diagnosis not present

## 2023-01-31 DIAGNOSIS — F01A0 Vascular dementia, mild, without behavioral disturbance, psychotic disturbance, mood disturbance, and anxiety: Secondary | ICD-10-CM | POA: Diagnosis not present

## 2023-01-31 DIAGNOSIS — L89322 Pressure ulcer of left buttock, stage 2: Secondary | ICD-10-CM | POA: Diagnosis not present

## 2023-01-31 DIAGNOSIS — M1712 Unilateral primary osteoarthritis, left knee: Secondary | ICD-10-CM | POA: Diagnosis not present

## 2023-01-31 DIAGNOSIS — J45909 Unspecified asthma, uncomplicated: Secondary | ICD-10-CM | POA: Diagnosis not present

## 2023-01-31 DIAGNOSIS — R2681 Unsteadiness on feet: Secondary | ICD-10-CM | POA: Diagnosis not present

## 2023-01-31 DIAGNOSIS — Z681 Body mass index (BMI) 19 or less, adult: Secondary | ICD-10-CM | POA: Diagnosis not present

## 2023-01-31 DIAGNOSIS — G309 Alzheimer's disease, unspecified: Secondary | ICD-10-CM | POA: Diagnosis not present

## 2023-01-31 DIAGNOSIS — G214 Vascular parkinsonism: Secondary | ICD-10-CM | POA: Diagnosis not present

## 2023-01-31 DIAGNOSIS — F0280 Dementia in other diseases classified elsewhere without behavioral disturbance: Secondary | ICD-10-CM | POA: Diagnosis not present

## 2023-01-31 DIAGNOSIS — F331 Major depressive disorder, recurrent, moderate: Secondary | ICD-10-CM | POA: Diagnosis not present

## 2023-02-01 DIAGNOSIS — L89322 Pressure ulcer of left buttock, stage 2: Secondary | ICD-10-CM | POA: Diagnosis not present

## 2023-02-01 DIAGNOSIS — F01A0 Vascular dementia, mild, without behavioral disturbance, psychotic disturbance, mood disturbance, and anxiety: Secondary | ICD-10-CM | POA: Diagnosis not present

## 2023-02-01 DIAGNOSIS — G309 Alzheimer's disease, unspecified: Secondary | ICD-10-CM | POA: Diagnosis not present

## 2023-02-01 DIAGNOSIS — G214 Vascular parkinsonism: Secondary | ICD-10-CM | POA: Diagnosis not present

## 2023-02-01 DIAGNOSIS — F0280 Dementia in other diseases classified elsewhere without behavioral disturbance: Secondary | ICD-10-CM | POA: Diagnosis not present

## 2023-02-01 DIAGNOSIS — J45909 Unspecified asthma, uncomplicated: Secondary | ICD-10-CM | POA: Diagnosis not present

## 2023-02-04 DIAGNOSIS — Z7982 Long term (current) use of aspirin: Secondary | ICD-10-CM | POA: Diagnosis not present

## 2023-02-04 DIAGNOSIS — D509 Iron deficiency anemia, unspecified: Secondary | ICD-10-CM | POA: Diagnosis not present

## 2023-02-04 DIAGNOSIS — G214 Vascular parkinsonism: Secondary | ICD-10-CM | POA: Diagnosis not present

## 2023-02-04 DIAGNOSIS — F01A0 Vascular dementia, mild, without behavioral disturbance, psychotic disturbance, mood disturbance, and anxiety: Secondary | ICD-10-CM | POA: Diagnosis not present

## 2023-02-04 DIAGNOSIS — Z7951 Long term (current) use of inhaled steroids: Secondary | ICD-10-CM | POA: Diagnosis not present

## 2023-02-04 DIAGNOSIS — Z8673 Personal history of transient ischemic attack (TIA), and cerebral infarction without residual deficits: Secondary | ICD-10-CM | POA: Diagnosis not present

## 2023-02-04 DIAGNOSIS — J45909 Unspecified asthma, uncomplicated: Secondary | ICD-10-CM | POA: Diagnosis not present

## 2023-02-04 DIAGNOSIS — N183 Chronic kidney disease, stage 3 unspecified: Secondary | ICD-10-CM | POA: Diagnosis not present

## 2023-02-04 DIAGNOSIS — G309 Alzheimer's disease, unspecified: Secondary | ICD-10-CM | POA: Diagnosis not present

## 2023-02-04 DIAGNOSIS — Z9181 History of falling: Secondary | ICD-10-CM | POA: Diagnosis not present

## 2023-02-04 DIAGNOSIS — F02A0 Dementia in other diseases classified elsewhere, mild, without behavioral disturbance, psychotic disturbance, mood disturbance, and anxiety: Secondary | ICD-10-CM | POA: Diagnosis not present

## 2023-02-04 DIAGNOSIS — F341 Dysthymic disorder: Secondary | ICD-10-CM | POA: Diagnosis not present

## 2023-02-05 DIAGNOSIS — E7800 Pure hypercholesterolemia, unspecified: Secondary | ICD-10-CM | POA: Diagnosis not present

## 2023-02-05 DIAGNOSIS — M1990 Unspecified osteoarthritis, unspecified site: Secondary | ICD-10-CM | POA: Diagnosis not present

## 2023-02-07 DIAGNOSIS — F01A0 Vascular dementia, mild, without behavioral disturbance, psychotic disturbance, mood disturbance, and anxiety: Secondary | ICD-10-CM | POA: Diagnosis not present

## 2023-02-07 DIAGNOSIS — F02A0 Dementia in other diseases classified elsewhere, mild, without behavioral disturbance, psychotic disturbance, mood disturbance, and anxiety: Secondary | ICD-10-CM | POA: Diagnosis not present

## 2023-02-07 DIAGNOSIS — N183 Chronic kidney disease, stage 3 unspecified: Secondary | ICD-10-CM | POA: Diagnosis not present

## 2023-02-07 DIAGNOSIS — G214 Vascular parkinsonism: Secondary | ICD-10-CM | POA: Diagnosis not present

## 2023-02-07 DIAGNOSIS — J45909 Unspecified asthma, uncomplicated: Secondary | ICD-10-CM | POA: Diagnosis not present

## 2023-02-07 DIAGNOSIS — G309 Alzheimer's disease, unspecified: Secondary | ICD-10-CM | POA: Diagnosis not present

## 2023-02-08 DIAGNOSIS — F01A0 Vascular dementia, mild, without behavioral disturbance, psychotic disturbance, mood disturbance, and anxiety: Secondary | ICD-10-CM | POA: Diagnosis not present

## 2023-02-08 DIAGNOSIS — G309 Alzheimer's disease, unspecified: Secondary | ICD-10-CM | POA: Diagnosis not present

## 2023-02-08 DIAGNOSIS — N183 Chronic kidney disease, stage 3 unspecified: Secondary | ICD-10-CM | POA: Diagnosis not present

## 2023-02-08 DIAGNOSIS — J45909 Unspecified asthma, uncomplicated: Secondary | ICD-10-CM | POA: Diagnosis not present

## 2023-02-08 DIAGNOSIS — F02A0 Dementia in other diseases classified elsewhere, mild, without behavioral disturbance, psychotic disturbance, mood disturbance, and anxiety: Secondary | ICD-10-CM | POA: Diagnosis not present

## 2023-02-08 DIAGNOSIS — G214 Vascular parkinsonism: Secondary | ICD-10-CM | POA: Diagnosis not present

## 2023-02-09 DIAGNOSIS — I679 Cerebrovascular disease, unspecified: Secondary | ICD-10-CM | POA: Diagnosis not present

## 2023-02-09 DIAGNOSIS — M6281 Muscle weakness (generalized): Secondary | ICD-10-CM | POA: Diagnosis not present

## 2023-02-09 DIAGNOSIS — I6990 Unspecified sequelae of unspecified cerebrovascular disease: Secondary | ICD-10-CM | POA: Diagnosis not present

## 2023-02-09 DIAGNOSIS — N1831 Chronic kidney disease, stage 3a: Secondary | ICD-10-CM | POA: Diagnosis not present

## 2023-02-09 DIAGNOSIS — K219 Gastro-esophageal reflux disease without esophagitis: Secondary | ICD-10-CM | POA: Diagnosis not present

## 2023-02-09 DIAGNOSIS — G20A1 Parkinson's disease without dyskinesia, without mention of fluctuations: Secondary | ICD-10-CM | POA: Diagnosis not present

## 2023-02-09 DIAGNOSIS — I69315 Cognitive social or emotional deficit following cerebral infarction: Secondary | ICD-10-CM | POA: Diagnosis not present

## 2023-02-12 DIAGNOSIS — F01A0 Vascular dementia, mild, without behavioral disturbance, psychotic disturbance, mood disturbance, and anxiety: Secondary | ICD-10-CM | POA: Diagnosis not present

## 2023-02-12 DIAGNOSIS — I69315 Cognitive social or emotional deficit following cerebral infarction: Secondary | ICD-10-CM | POA: Diagnosis not present

## 2023-02-12 DIAGNOSIS — G214 Vascular parkinsonism: Secondary | ICD-10-CM | POA: Diagnosis not present

## 2023-02-12 DIAGNOSIS — G309 Alzheimer's disease, unspecified: Secondary | ICD-10-CM | POA: Diagnosis not present

## 2023-02-12 DIAGNOSIS — F5105 Insomnia due to other mental disorder: Secondary | ICD-10-CM | POA: Diagnosis not present

## 2023-02-13 DIAGNOSIS — N183 Chronic kidney disease, stage 3 unspecified: Secondary | ICD-10-CM | POA: Diagnosis not present

## 2023-02-13 DIAGNOSIS — G214 Vascular parkinsonism: Secondary | ICD-10-CM | POA: Diagnosis not present

## 2023-02-13 DIAGNOSIS — F02A0 Dementia in other diseases classified elsewhere, mild, without behavioral disturbance, psychotic disturbance, mood disturbance, and anxiety: Secondary | ICD-10-CM | POA: Diagnosis not present

## 2023-02-13 DIAGNOSIS — J45909 Unspecified asthma, uncomplicated: Secondary | ICD-10-CM | POA: Diagnosis not present

## 2023-02-13 DIAGNOSIS — F01A0 Vascular dementia, mild, without behavioral disturbance, psychotic disturbance, mood disturbance, and anxiety: Secondary | ICD-10-CM | POA: Diagnosis not present

## 2023-02-13 DIAGNOSIS — G309 Alzheimer's disease, unspecified: Secondary | ICD-10-CM | POA: Diagnosis not present

## 2023-02-14 DIAGNOSIS — G214 Vascular parkinsonism: Secondary | ICD-10-CM | POA: Diagnosis not present

## 2023-02-14 DIAGNOSIS — N183 Chronic kidney disease, stage 3 unspecified: Secondary | ICD-10-CM | POA: Diagnosis not present

## 2023-02-14 DIAGNOSIS — G309 Alzheimer's disease, unspecified: Secondary | ICD-10-CM | POA: Diagnosis not present

## 2023-02-14 DIAGNOSIS — F01A0 Vascular dementia, mild, without behavioral disturbance, psychotic disturbance, mood disturbance, and anxiety: Secondary | ICD-10-CM | POA: Diagnosis not present

## 2023-02-14 DIAGNOSIS — F02A0 Dementia in other diseases classified elsewhere, mild, without behavioral disturbance, psychotic disturbance, mood disturbance, and anxiety: Secondary | ICD-10-CM | POA: Diagnosis not present

## 2023-02-14 DIAGNOSIS — J45909 Unspecified asthma, uncomplicated: Secondary | ICD-10-CM | POA: Diagnosis not present

## 2023-02-21 DIAGNOSIS — F02A0 Dementia in other diseases classified elsewhere, mild, without behavioral disturbance, psychotic disturbance, mood disturbance, and anxiety: Secondary | ICD-10-CM | POA: Diagnosis not present

## 2023-02-21 DIAGNOSIS — N183 Chronic kidney disease, stage 3 unspecified: Secondary | ICD-10-CM | POA: Diagnosis not present

## 2023-02-21 DIAGNOSIS — J45909 Unspecified asthma, uncomplicated: Secondary | ICD-10-CM | POA: Diagnosis not present

## 2023-02-21 DIAGNOSIS — F01A0 Vascular dementia, mild, without behavioral disturbance, psychotic disturbance, mood disturbance, and anxiety: Secondary | ICD-10-CM | POA: Diagnosis not present

## 2023-02-21 DIAGNOSIS — G309 Alzheimer's disease, unspecified: Secondary | ICD-10-CM | POA: Diagnosis not present

## 2023-02-21 DIAGNOSIS — G214 Vascular parkinsonism: Secondary | ICD-10-CM | POA: Diagnosis not present

## 2023-02-26 DIAGNOSIS — G214 Vascular parkinsonism: Secondary | ICD-10-CM | POA: Diagnosis not present

## 2023-02-26 DIAGNOSIS — F03B18 Unspecified dementia, moderate, with other behavioral disturbance: Secondary | ICD-10-CM | POA: Diagnosis not present

## 2023-02-26 DIAGNOSIS — G309 Alzheimer's disease, unspecified: Secondary | ICD-10-CM | POA: Diagnosis not present

## 2023-02-26 DIAGNOSIS — G3184 Mild cognitive impairment, so stated: Secondary | ICD-10-CM | POA: Diagnosis not present

## 2023-02-26 DIAGNOSIS — F02A0 Dementia in other diseases classified elsewhere, mild, without behavioral disturbance, psychotic disturbance, mood disturbance, and anxiety: Secondary | ICD-10-CM | POA: Diagnosis not present

## 2023-02-26 DIAGNOSIS — N183 Chronic kidney disease, stage 3 unspecified: Secondary | ICD-10-CM | POA: Diagnosis not present

## 2023-02-26 DIAGNOSIS — F01A0 Vascular dementia, mild, without behavioral disturbance, psychotic disturbance, mood disturbance, and anxiety: Secondary | ICD-10-CM | POA: Diagnosis not present

## 2023-02-26 DIAGNOSIS — J45909 Unspecified asthma, uncomplicated: Secondary | ICD-10-CM | POA: Diagnosis not present

## 2023-02-27 DIAGNOSIS — I679 Cerebrovascular disease, unspecified: Secondary | ICD-10-CM | POA: Diagnosis not present

## 2023-02-27 DIAGNOSIS — H9113 Presbycusis, bilateral: Secondary | ICD-10-CM | POA: Diagnosis not present

## 2023-02-27 DIAGNOSIS — R197 Diarrhea, unspecified: Secondary | ICD-10-CM | POA: Diagnosis not present

## 2023-02-27 DIAGNOSIS — I129 Hypertensive chronic kidney disease with stage 1 through stage 4 chronic kidney disease, or unspecified chronic kidney disease: Secondary | ICD-10-CM | POA: Diagnosis not present

## 2023-02-27 DIAGNOSIS — R269 Unspecified abnormalities of gait and mobility: Secondary | ICD-10-CM | POA: Diagnosis not present

## 2023-02-27 DIAGNOSIS — F01A0 Vascular dementia, mild, without behavioral disturbance, psychotic disturbance, mood disturbance, and anxiety: Secondary | ICD-10-CM | POA: Diagnosis not present

## 2023-02-27 DIAGNOSIS — G214 Vascular parkinsonism: Secondary | ICD-10-CM | POA: Diagnosis not present

## 2023-02-27 DIAGNOSIS — L89301 Pressure ulcer of unspecified buttock, stage 1: Secondary | ICD-10-CM | POA: Diagnosis not present

## 2023-02-27 DIAGNOSIS — R319 Hematuria, unspecified: Secondary | ICD-10-CM | POA: Diagnosis not present

## 2023-02-28 DIAGNOSIS — F01A0 Vascular dementia, mild, without behavioral disturbance, psychotic disturbance, mood disturbance, and anxiety: Secondary | ICD-10-CM | POA: Diagnosis not present

## 2023-02-28 DIAGNOSIS — J45909 Unspecified asthma, uncomplicated: Secondary | ICD-10-CM | POA: Diagnosis not present

## 2023-02-28 DIAGNOSIS — R399 Unspecified symptoms and signs involving the genitourinary system: Secondary | ICD-10-CM | POA: Diagnosis not present

## 2023-02-28 DIAGNOSIS — G214 Vascular parkinsonism: Secondary | ICD-10-CM | POA: Diagnosis not present

## 2023-02-28 DIAGNOSIS — F02A0 Dementia in other diseases classified elsewhere, mild, without behavioral disturbance, psychotic disturbance, mood disturbance, and anxiety: Secondary | ICD-10-CM | POA: Diagnosis not present

## 2023-02-28 DIAGNOSIS — N183 Chronic kidney disease, stage 3 unspecified: Secondary | ICD-10-CM | POA: Diagnosis not present

## 2023-02-28 DIAGNOSIS — G309 Alzheimer's disease, unspecified: Secondary | ICD-10-CM | POA: Diagnosis not present

## 2023-03-02 DIAGNOSIS — Z8673 Personal history of transient ischemic attack (TIA), and cerebral infarction without residual deficits: Secondary | ICD-10-CM | POA: Diagnosis not present

## 2023-03-02 DIAGNOSIS — F02B3 Dementia in other diseases classified elsewhere, moderate, with mood disturbance: Secondary | ICD-10-CM | POA: Diagnosis not present

## 2023-03-02 DIAGNOSIS — G20C Parkinsonism, unspecified: Secondary | ICD-10-CM | POA: Diagnosis not present

## 2023-03-02 DIAGNOSIS — N1831 Chronic kidney disease, stage 3a: Secondary | ICD-10-CM | POA: Diagnosis not present

## 2023-03-02 DIAGNOSIS — E1122 Type 2 diabetes mellitus with diabetic chronic kidney disease: Secondary | ICD-10-CM | POA: Diagnosis not present

## 2023-03-02 DIAGNOSIS — R310 Gross hematuria: Secondary | ICD-10-CM | POA: Diagnosis not present

## 2023-03-05 DIAGNOSIS — E118 Type 2 diabetes mellitus with unspecified complications: Secondary | ICD-10-CM | POA: Diagnosis not present

## 2023-03-05 DIAGNOSIS — E785 Hyperlipidemia, unspecified: Secondary | ICD-10-CM | POA: Diagnosis not present

## 2023-03-05 DIAGNOSIS — N1831 Chronic kidney disease, stage 3a: Secondary | ICD-10-CM | POA: Diagnosis not present

## 2023-03-05 DIAGNOSIS — G3184 Mild cognitive impairment, so stated: Secondary | ICD-10-CM | POA: Diagnosis not present

## 2023-03-05 DIAGNOSIS — E611 Iron deficiency: Secondary | ICD-10-CM | POA: Diagnosis not present

## 2023-03-05 DIAGNOSIS — E538 Deficiency of other specified B group vitamins: Secondary | ICD-10-CM | POA: Diagnosis not present

## 2023-03-06 ENCOUNTER — Other Ambulatory Visit: Payer: Self-pay | Admitting: Internal Medicine

## 2023-03-06 DIAGNOSIS — F341 Dysthymic disorder: Secondary | ICD-10-CM | POA: Diagnosis not present

## 2023-03-06 DIAGNOSIS — R310 Gross hematuria: Secondary | ICD-10-CM

## 2023-03-06 DIAGNOSIS — Z7951 Long term (current) use of inhaled steroids: Secondary | ICD-10-CM | POA: Diagnosis not present

## 2023-03-06 DIAGNOSIS — J45909 Unspecified asthma, uncomplicated: Secondary | ICD-10-CM | POA: Diagnosis not present

## 2023-03-06 DIAGNOSIS — G214 Vascular parkinsonism: Secondary | ICD-10-CM | POA: Diagnosis not present

## 2023-03-06 DIAGNOSIS — F02A0 Dementia in other diseases classified elsewhere, mild, without behavioral disturbance, psychotic disturbance, mood disturbance, and anxiety: Secondary | ICD-10-CM | POA: Diagnosis not present

## 2023-03-06 DIAGNOSIS — E7800 Pure hypercholesterolemia, unspecified: Secondary | ICD-10-CM | POA: Diagnosis not present

## 2023-03-06 DIAGNOSIS — N183 Chronic kidney disease, stage 3 unspecified: Secondary | ICD-10-CM | POA: Diagnosis not present

## 2023-03-06 DIAGNOSIS — Z7982 Long term (current) use of aspirin: Secondary | ICD-10-CM | POA: Diagnosis not present

## 2023-03-06 DIAGNOSIS — F01A0 Vascular dementia, mild, without behavioral disturbance, psychotic disturbance, mood disturbance, and anxiety: Secondary | ICD-10-CM | POA: Diagnosis not present

## 2023-03-06 DIAGNOSIS — M1990 Unspecified osteoarthritis, unspecified site: Secondary | ICD-10-CM | POA: Diagnosis not present

## 2023-03-06 DIAGNOSIS — D509 Iron deficiency anemia, unspecified: Secondary | ICD-10-CM | POA: Diagnosis not present

## 2023-03-06 DIAGNOSIS — Z8673 Personal history of transient ischemic attack (TIA), and cerebral infarction without residual deficits: Secondary | ICD-10-CM | POA: Diagnosis not present

## 2023-03-06 DIAGNOSIS — G309 Alzheimer's disease, unspecified: Secondary | ICD-10-CM | POA: Diagnosis not present

## 2023-03-06 DIAGNOSIS — Z9181 History of falling: Secondary | ICD-10-CM | POA: Diagnosis not present

## 2023-03-07 DIAGNOSIS — F02A0 Dementia in other diseases classified elsewhere, mild, without behavioral disturbance, psychotic disturbance, mood disturbance, and anxiety: Secondary | ICD-10-CM | POA: Diagnosis not present

## 2023-03-07 DIAGNOSIS — G309 Alzheimer's disease, unspecified: Secondary | ICD-10-CM | POA: Diagnosis not present

## 2023-03-07 DIAGNOSIS — F01A0 Vascular dementia, mild, without behavioral disturbance, psychotic disturbance, mood disturbance, and anxiety: Secondary | ICD-10-CM | POA: Diagnosis not present

## 2023-03-07 DIAGNOSIS — N183 Chronic kidney disease, stage 3 unspecified: Secondary | ICD-10-CM | POA: Diagnosis not present

## 2023-03-07 DIAGNOSIS — J45909 Unspecified asthma, uncomplicated: Secondary | ICD-10-CM | POA: Diagnosis not present

## 2023-03-07 DIAGNOSIS — G214 Vascular parkinsonism: Secondary | ICD-10-CM | POA: Diagnosis not present

## 2023-03-08 DIAGNOSIS — F02A0 Dementia in other diseases classified elsewhere, mild, without behavioral disturbance, psychotic disturbance, mood disturbance, and anxiety: Secondary | ICD-10-CM | POA: Diagnosis not present

## 2023-03-08 DIAGNOSIS — J45909 Unspecified asthma, uncomplicated: Secondary | ICD-10-CM | POA: Diagnosis not present

## 2023-03-08 DIAGNOSIS — N183 Chronic kidney disease, stage 3 unspecified: Secondary | ICD-10-CM | POA: Diagnosis not present

## 2023-03-08 DIAGNOSIS — G309 Alzheimer's disease, unspecified: Secondary | ICD-10-CM | POA: Diagnosis not present

## 2023-03-08 DIAGNOSIS — G214 Vascular parkinsonism: Secondary | ICD-10-CM | POA: Diagnosis not present

## 2023-03-08 DIAGNOSIS — F01A0 Vascular dementia, mild, without behavioral disturbance, psychotic disturbance, mood disturbance, and anxiety: Secondary | ICD-10-CM | POA: Diagnosis not present

## 2023-03-13 DIAGNOSIS — F411 Generalized anxiety disorder: Secondary | ICD-10-CM | POA: Diagnosis not present

## 2023-03-13 DIAGNOSIS — F331 Major depressive disorder, recurrent, moderate: Secondary | ICD-10-CM | POA: Diagnosis not present

## 2023-03-13 DIAGNOSIS — F01A0 Vascular dementia, mild, without behavioral disturbance, psychotic disturbance, mood disturbance, and anxiety: Secondary | ICD-10-CM | POA: Diagnosis not present

## 2023-03-13 DIAGNOSIS — I6990 Unspecified sequelae of unspecified cerebrovascular disease: Secondary | ICD-10-CM | POA: Diagnosis not present

## 2023-03-13 DIAGNOSIS — F5105 Insomnia due to other mental disorder: Secondary | ICD-10-CM | POA: Diagnosis not present

## 2023-03-13 DIAGNOSIS — G20A1 Parkinson's disease without dyskinesia, without mention of fluctuations: Secondary | ICD-10-CM | POA: Diagnosis not present

## 2023-03-13 DIAGNOSIS — G309 Alzheimer's disease, unspecified: Secondary | ICD-10-CM | POA: Diagnosis not present

## 2023-03-14 DIAGNOSIS — F01A0 Vascular dementia, mild, without behavioral disturbance, psychotic disturbance, mood disturbance, and anxiety: Secondary | ICD-10-CM | POA: Diagnosis not present

## 2023-03-14 DIAGNOSIS — J45909 Unspecified asthma, uncomplicated: Secondary | ICD-10-CM | POA: Diagnosis not present

## 2023-03-14 DIAGNOSIS — F02A0 Dementia in other diseases classified elsewhere, mild, without behavioral disturbance, psychotic disturbance, mood disturbance, and anxiety: Secondary | ICD-10-CM | POA: Diagnosis not present

## 2023-03-14 DIAGNOSIS — N183 Chronic kidney disease, stage 3 unspecified: Secondary | ICD-10-CM | POA: Diagnosis not present

## 2023-03-14 DIAGNOSIS — G214 Vascular parkinsonism: Secondary | ICD-10-CM | POA: Diagnosis not present

## 2023-03-14 DIAGNOSIS — G309 Alzheimer's disease, unspecified: Secondary | ICD-10-CM | POA: Diagnosis not present

## 2023-03-15 ENCOUNTER — Ambulatory Visit
Admission: RE | Admit: 2023-03-15 | Discharge: 2023-03-15 | Disposition: A | Discharge status: Home / Self Care | Payer: Medicare Other | Source: Ambulatory Visit | Attending: Internal Medicine | Admitting: Internal Medicine

## 2023-03-15 DIAGNOSIS — R310 Gross hematuria: Secondary | ICD-10-CM | POA: Diagnosis not present

## 2023-03-15 DIAGNOSIS — K838 Other specified diseases of biliary tract: Secondary | ICD-10-CM | POA: Diagnosis not present

## 2023-03-15 DIAGNOSIS — N200 Calculus of kidney: Secondary | ICD-10-CM | POA: Diagnosis not present

## 2023-03-15 DIAGNOSIS — K828 Other specified diseases of gallbladder: Secondary | ICD-10-CM | POA: Diagnosis not present

## 2023-03-15 DIAGNOSIS — K7689 Other specified diseases of liver: Secondary | ICD-10-CM | POA: Diagnosis not present

## 2023-03-15 IMAGING — CR DG CHEST 2V
2 series · 3 of 3 positions shown · non-contrast
Comparison: May 15, 2020.

CLINICAL DATA: Hypoxia fatigue and decreased appetite in an
89-year-old male.

EXAM:
CHEST - 2 VIEW

[chest lat]
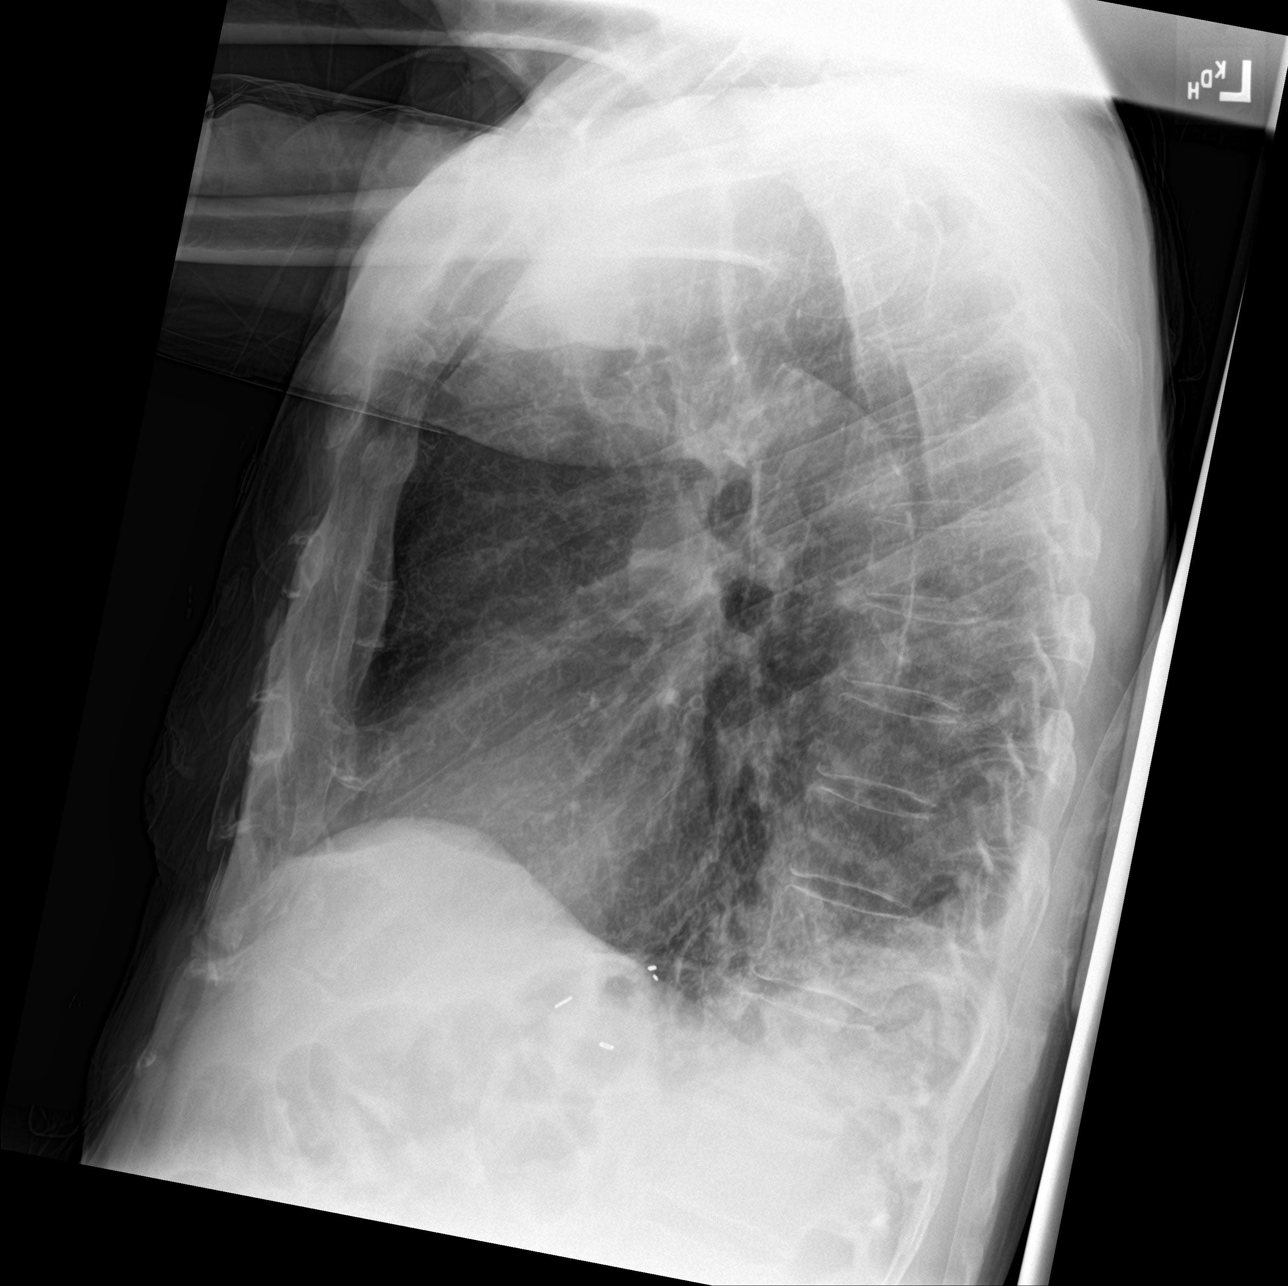

[Series 3: chest ap · 0.14mm/px · 2 of 2 slices shown]
[im 1/2]
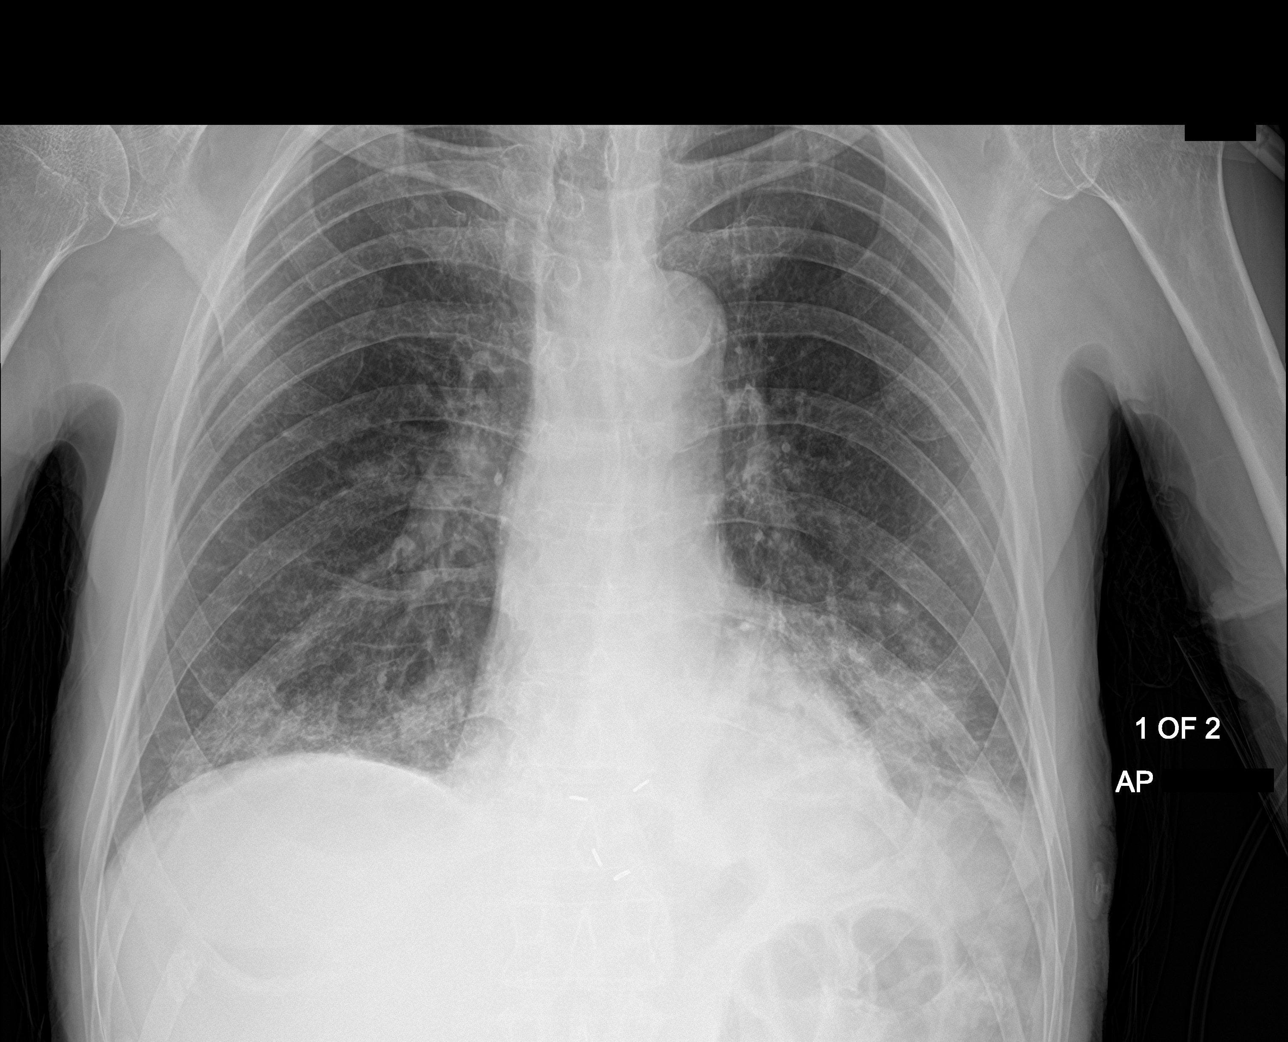
[im 2/2]
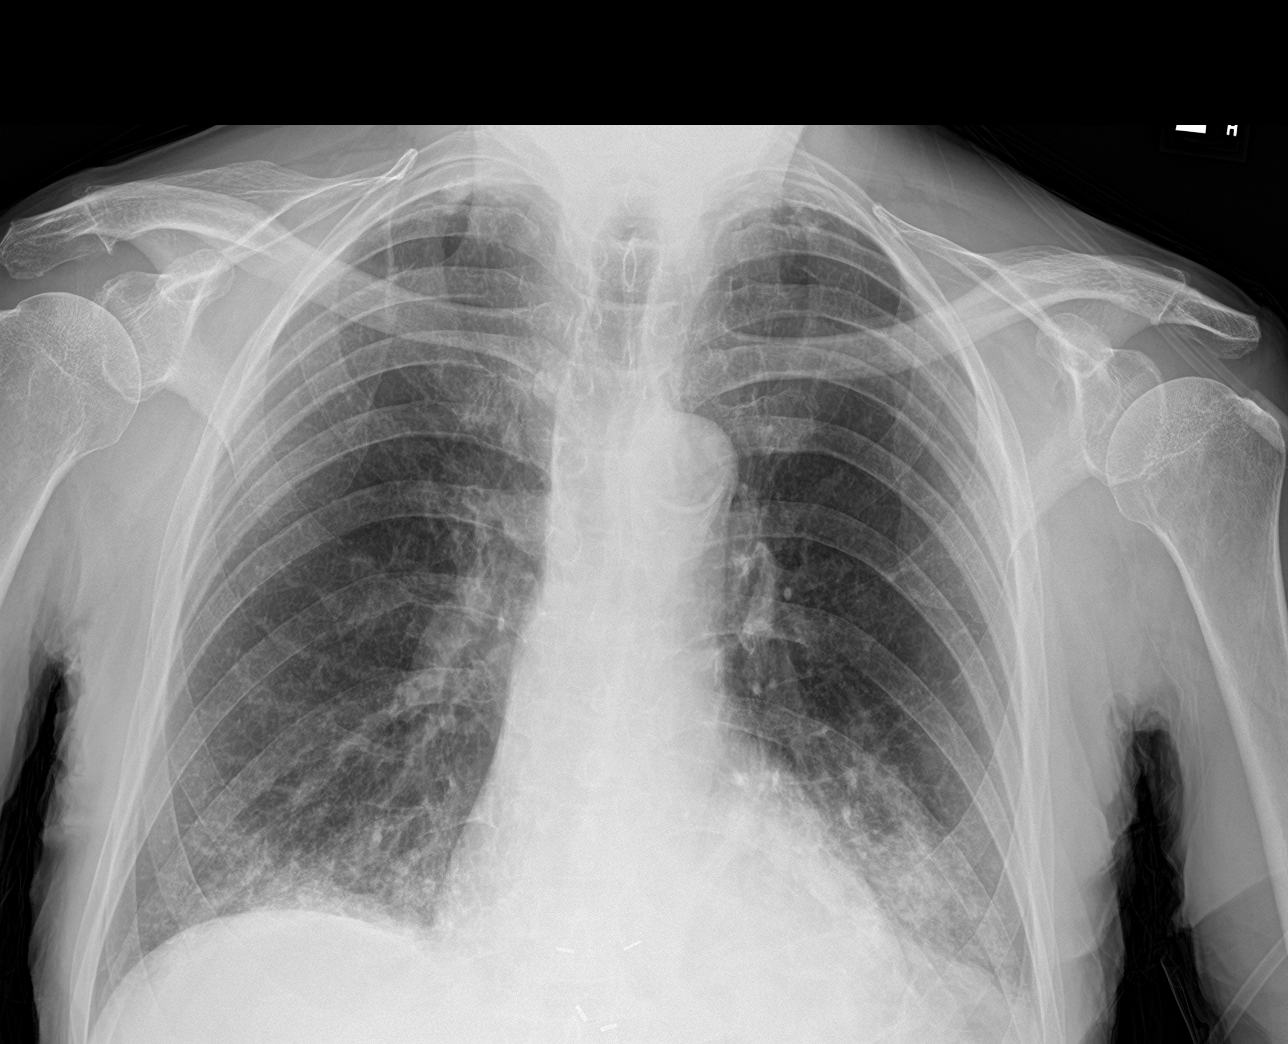

[3 of 3 positions shown; findings below may reference images not displayed]

FINDINGS: Trachea is midline.

Cardiomediastinal contours and hilar structures are unremarkable and
unchanged.

Partially obscured LEFT heart border and LEFT hemidiaphragm with
ill-defined airspace disease in the LEFT and RIGHT lung base. Small
bilateral pleural effusions.

On limited assessment there is no acute skeletal process.
IMPRESSION: Findings suspicious for bibasilar pneumonia or aspiration related
changes.

Small effusions.

## 2023-03-21 DIAGNOSIS — E611 Iron deficiency: Secondary | ICD-10-CM | POA: Diagnosis not present

## 2023-03-21 DIAGNOSIS — F32A Depression, unspecified: Secondary | ICD-10-CM | POA: Diagnosis not present

## 2023-03-21 DIAGNOSIS — N1831 Chronic kidney disease, stage 3a: Secondary | ICD-10-CM | POA: Diagnosis not present

## 2023-03-21 DIAGNOSIS — E785 Hyperlipidemia, unspecified: Secondary | ICD-10-CM | POA: Diagnosis not present

## 2023-03-21 DIAGNOSIS — E1122 Type 2 diabetes mellitus with diabetic chronic kidney disease: Secondary | ICD-10-CM | POA: Diagnosis not present

## 2023-03-21 DIAGNOSIS — G20C Parkinsonism, unspecified: Secondary | ICD-10-CM | POA: Diagnosis not present

## 2023-03-21 DIAGNOSIS — N200 Calculus of kidney: Secondary | ICD-10-CM | POA: Diagnosis not present

## 2023-03-21 DIAGNOSIS — K838 Other specified diseases of biliary tract: Secondary | ICD-10-CM | POA: Diagnosis not present

## 2023-03-26 ENCOUNTER — Other Ambulatory Visit: Payer: Self-pay | Admitting: Internal Medicine

## 2023-03-26 DIAGNOSIS — K838 Other specified diseases of biliary tract: Secondary | ICD-10-CM

## 2023-03-28 DIAGNOSIS — I1310 Hypertensive heart and chronic kidney disease without heart failure, with stage 1 through stage 4 chronic kidney disease, or unspecified chronic kidney disease: Secondary | ICD-10-CM | POA: Diagnosis not present

## 2023-03-28 DIAGNOSIS — G20A1 Parkinson's disease without dyskinesia, without mention of fluctuations: Secondary | ICD-10-CM | POA: Diagnosis not present

## 2023-03-28 DIAGNOSIS — R7303 Prediabetes: Secondary | ICD-10-CM | POA: Diagnosis not present

## 2023-03-28 DIAGNOSIS — S80812A Abrasion, left lower leg, initial encounter: Secondary | ICD-10-CM | POA: Diagnosis not present

## 2023-03-28 DIAGNOSIS — F01A0 Vascular dementia, mild, without behavioral disturbance, psychotic disturbance, mood disturbance, and anxiety: Secondary | ICD-10-CM | POA: Diagnosis not present

## 2023-03-28 DIAGNOSIS — N1831 Chronic kidney disease, stage 3a: Secondary | ICD-10-CM | POA: Diagnosis not present

## 2023-03-28 DIAGNOSIS — R269 Unspecified abnormalities of gait and mobility: Secondary | ICD-10-CM | POA: Diagnosis not present

## 2023-04-03 DIAGNOSIS — M1990 Unspecified osteoarthritis, unspecified site: Secondary | ICD-10-CM | POA: Diagnosis not present

## 2023-04-03 DIAGNOSIS — E7800 Pure hypercholesterolemia, unspecified: Secondary | ICD-10-CM | POA: Diagnosis not present

## 2023-04-04 ENCOUNTER — Ambulatory Visit
Admission: RE | Admit: 2023-04-04 | Discharge: 2023-04-04 | Disposition: A | Discharge status: Home / Self Care | Payer: Medicare Other | Source: Ambulatory Visit | Attending: Internal Medicine | Admitting: Internal Medicine

## 2023-04-04 DIAGNOSIS — K7689 Other specified diseases of liver: Secondary | ICD-10-CM | POA: Diagnosis not present

## 2023-04-04 DIAGNOSIS — K838 Other specified diseases of biliary tract: Secondary | ICD-10-CM | POA: Diagnosis not present

## 2023-04-04 DIAGNOSIS — K8020 Calculus of gallbladder without cholecystitis without obstruction: Secondary | ICD-10-CM | POA: Diagnosis not present

## 2023-04-04 DIAGNOSIS — N281 Cyst of kidney, acquired: Secondary | ICD-10-CM | POA: Diagnosis not present

## 2023-04-04 MED ORDER — GADOBUTROL 1 MMOL/ML IV SOLN
6.0000 mL | Freq: Once | INTRAVENOUS | Status: AC | PRN
  Administered 2023-04-04: 6 mL via INTRAVENOUS

## 2023-04-13 DIAGNOSIS — Z23 Encounter for immunization: Secondary | ICD-10-CM | POA: Diagnosis not present

## 2023-04-24 DIAGNOSIS — G4700 Insomnia, unspecified: Secondary | ICD-10-CM | POA: Diagnosis not present

## 2023-04-24 DIAGNOSIS — G309 Alzheimer's disease, unspecified: Secondary | ICD-10-CM | POA: Diagnosis not present

## 2023-04-24 DIAGNOSIS — F331 Major depressive disorder, recurrent, moderate: Secondary | ICD-10-CM | POA: Diagnosis not present

## 2023-04-24 DIAGNOSIS — F419 Anxiety disorder, unspecified: Secondary | ICD-10-CM | POA: Diagnosis not present

## 2023-04-24 DIAGNOSIS — G20A1 Parkinson's disease without dyskinesia, without mention of fluctuations: Secondary | ICD-10-CM | POA: Diagnosis not present

## 2023-04-25 DIAGNOSIS — F02A0 Dementia in other diseases classified elsewhere, mild, without behavioral disturbance, psychotic disturbance, mood disturbance, and anxiety: Secondary | ICD-10-CM | POA: Diagnosis not present

## 2023-04-25 DIAGNOSIS — G309 Alzheimer's disease, unspecified: Secondary | ICD-10-CM | POA: Diagnosis not present

## 2023-04-25 DIAGNOSIS — F331 Major depressive disorder, recurrent, moderate: Secondary | ICD-10-CM | POA: Diagnosis not present

## 2023-04-25 DIAGNOSIS — N1831 Chronic kidney disease, stage 3a: Secondary | ICD-10-CM | POA: Diagnosis not present

## 2023-04-25 DIAGNOSIS — R269 Unspecified abnormalities of gait and mobility: Secondary | ICD-10-CM | POA: Diagnosis not present

## 2023-04-25 DIAGNOSIS — I1310 Hypertensive heart and chronic kidney disease without heart failure, with stage 1 through stage 4 chronic kidney disease, or unspecified chronic kidney disease: Secondary | ICD-10-CM | POA: Diagnosis not present

## 2023-04-25 DIAGNOSIS — G20B1 Parkinson's disease with dyskinesia, without mention of fluctuations: Secondary | ICD-10-CM | POA: Diagnosis not present

## 2023-04-26 DIAGNOSIS — K8020 Calculus of gallbladder without cholecystitis without obstruction: Secondary | ICD-10-CM | POA: Diagnosis not present

## 2023-05-02 DIAGNOSIS — L2089 Other atopic dermatitis: Secondary | ICD-10-CM | POA: Diagnosis not present

## 2023-05-02 DIAGNOSIS — Z85828 Personal history of other malignant neoplasm of skin: Secondary | ICD-10-CM | POA: Diagnosis not present

## 2023-05-02 DIAGNOSIS — Z08 Encounter for follow-up examination after completed treatment for malignant neoplasm: Secondary | ICD-10-CM | POA: Diagnosis not present

## 2023-05-02 DIAGNOSIS — Z85820 Personal history of malignant melanoma of skin: Secondary | ICD-10-CM | POA: Diagnosis not present

## 2023-05-02 DIAGNOSIS — L853 Xerosis cutis: Secondary | ICD-10-CM | POA: Diagnosis not present

## 2023-05-02 DIAGNOSIS — L821 Other seborrheic keratosis: Secondary | ICD-10-CM | POA: Diagnosis not present

## 2023-05-02 DIAGNOSIS — L218 Other seborrheic dermatitis: Secondary | ICD-10-CM | POA: Diagnosis not present

## 2023-05-03 DIAGNOSIS — M1712 Unilateral primary osteoarthritis, left knee: Secondary | ICD-10-CM | POA: Diagnosis not present

## 2023-05-04 DIAGNOSIS — N1831 Chronic kidney disease, stage 3a: Secondary | ICD-10-CM | POA: Diagnosis not present

## 2023-05-04 DIAGNOSIS — M6281 Muscle weakness (generalized): Secondary | ICD-10-CM | POA: Diagnosis not present

## 2023-05-04 DIAGNOSIS — G20A1 Parkinson's disease without dyskinesia, without mention of fluctuations: Secondary | ICD-10-CM | POA: Diagnosis not present

## 2023-05-04 DIAGNOSIS — F0150 Vascular dementia without behavioral disturbance: Secondary | ICD-10-CM | POA: Diagnosis not present

## 2023-05-04 DIAGNOSIS — I6990 Unspecified sequelae of unspecified cerebrovascular disease: Secondary | ICD-10-CM | POA: Diagnosis not present

## 2023-05-07 ENCOUNTER — Ambulatory Visit: Payer: Medicare Other | Admitting: Physician Assistant

## 2023-05-09 DIAGNOSIS — E46 Unspecified protein-calorie malnutrition: Secondary | ICD-10-CM | POA: Diagnosis not present

## 2023-05-09 DIAGNOSIS — R269 Unspecified abnormalities of gait and mobility: Secondary | ICD-10-CM | POA: Diagnosis not present

## 2023-05-09 DIAGNOSIS — M1990 Unspecified osteoarthritis, unspecified site: Secondary | ICD-10-CM | POA: Diagnosis not present

## 2023-05-09 DIAGNOSIS — I130 Hypertensive heart and chronic kidney disease with heart failure and stage 1 through stage 4 chronic kidney disease, or unspecified chronic kidney disease: Secondary | ICD-10-CM | POA: Diagnosis not present

## 2023-05-09 DIAGNOSIS — L89301 Pressure ulcer of unspecified buttock, stage 1: Secondary | ICD-10-CM | POA: Diagnosis not present

## 2023-05-14 DIAGNOSIS — I6990 Unspecified sequelae of unspecified cerebrovascular disease: Secondary | ICD-10-CM | POA: Diagnosis not present

## 2023-05-14 DIAGNOSIS — G309 Alzheimer's disease, unspecified: Secondary | ICD-10-CM | POA: Diagnosis not present

## 2023-05-14 DIAGNOSIS — G20A1 Parkinson's disease without dyskinesia, without mention of fluctuations: Secondary | ICD-10-CM | POA: Diagnosis not present

## 2023-05-14 DIAGNOSIS — F331 Major depressive disorder, recurrent, moderate: Secondary | ICD-10-CM | POA: Diagnosis not present

## 2023-05-14 DIAGNOSIS — F01A0 Vascular dementia, mild, without behavioral disturbance, psychotic disturbance, mood disturbance, and anxiety: Secondary | ICD-10-CM | POA: Diagnosis not present

## 2023-05-16 DIAGNOSIS — F331 Major depressive disorder, recurrent, moderate: Secondary | ICD-10-CM | POA: Diagnosis not present

## 2023-05-16 DIAGNOSIS — G20A1 Parkinson's disease without dyskinesia, without mention of fluctuations: Secondary | ICD-10-CM | POA: Diagnosis not present

## 2023-05-16 DIAGNOSIS — R531 Weakness: Secondary | ICD-10-CM | POA: Diagnosis not present

## 2023-05-16 DIAGNOSIS — F0150 Vascular dementia without behavioral disturbance: Secondary | ICD-10-CM | POA: Diagnosis not present

## 2023-05-16 DIAGNOSIS — R5383 Other fatigue: Secondary | ICD-10-CM | POA: Diagnosis not present

## 2023-05-16 DIAGNOSIS — N1831 Chronic kidney disease, stage 3a: Secondary | ICD-10-CM | POA: Diagnosis not present

## 2023-05-16 DIAGNOSIS — N400 Enlarged prostate without lower urinary tract symptoms: Secondary | ICD-10-CM | POA: Diagnosis not present

## 2023-05-16 DIAGNOSIS — I130 Hypertensive heart and chronic kidney disease with heart failure and stage 1 through stage 4 chronic kidney disease, or unspecified chronic kidney disease: Secondary | ICD-10-CM | POA: Diagnosis not present

## 2023-05-17 DIAGNOSIS — Z79899 Other long term (current) drug therapy: Secondary | ICD-10-CM | POA: Diagnosis not present

## 2023-05-22 DIAGNOSIS — F419 Anxiety disorder, unspecified: Secondary | ICD-10-CM | POA: Diagnosis not present

## 2023-05-22 DIAGNOSIS — G309 Alzheimer's disease, unspecified: Secondary | ICD-10-CM | POA: Diagnosis not present

## 2023-05-23 DIAGNOSIS — G20A1 Parkinson's disease without dyskinesia, without mention of fluctuations: Secondary | ICD-10-CM | POA: Diagnosis not present

## 2023-05-23 DIAGNOSIS — E440 Moderate protein-calorie malnutrition: Secondary | ICD-10-CM | POA: Diagnosis not present

## 2023-05-23 DIAGNOSIS — D509 Iron deficiency anemia, unspecified: Secondary | ICD-10-CM | POA: Diagnosis not present

## 2023-05-23 DIAGNOSIS — I1310 Hypertensive heart and chronic kidney disease without heart failure, with stage 1 through stage 4 chronic kidney disease, or unspecified chronic kidney disease: Secondary | ICD-10-CM | POA: Diagnosis not present

## 2023-05-23 DIAGNOSIS — E876 Hypokalemia: Secondary | ICD-10-CM | POA: Diagnosis not present

## 2023-05-23 DIAGNOSIS — N1831 Chronic kidney disease, stage 3a: Secondary | ICD-10-CM | POA: Diagnosis not present

## 2023-05-23 DIAGNOSIS — F01A0 Vascular dementia, mild, without behavioral disturbance, psychotic disturbance, mood disturbance, and anxiety: Secondary | ICD-10-CM | POA: Diagnosis not present

## 2023-05-23 DIAGNOSIS — R269 Unspecified abnormalities of gait and mobility: Secondary | ICD-10-CM | POA: Diagnosis not present

## 2023-05-23 DIAGNOSIS — L89301 Pressure ulcer of unspecified buttock, stage 1: Secondary | ICD-10-CM | POA: Diagnosis not present

## 2023-05-23 DIAGNOSIS — G4700 Insomnia, unspecified: Secondary | ICD-10-CM | POA: Diagnosis not present

## 2023-05-24 ENCOUNTER — Ambulatory Visit: Payer: Medicare Other | Admitting: Physician Assistant

## 2023-05-29 DIAGNOSIS — I130 Hypertensive heart and chronic kidney disease with heart failure and stage 1 through stage 4 chronic kidney disease, or unspecified chronic kidney disease: Secondary | ICD-10-CM | POA: Diagnosis not present

## 2023-05-29 DIAGNOSIS — M1990 Unspecified osteoarthritis, unspecified site: Secondary | ICD-10-CM | POA: Diagnosis not present

## 2023-05-30 DIAGNOSIS — I1310 Hypertensive heart and chronic kidney disease without heart failure, with stage 1 through stage 4 chronic kidney disease, or unspecified chronic kidney disease: Secondary | ICD-10-CM | POA: Diagnosis not present

## 2023-05-30 DIAGNOSIS — F01A0 Vascular dementia, mild, without behavioral disturbance, psychotic disturbance, mood disturbance, and anxiety: Secondary | ICD-10-CM | POA: Diagnosis not present

## 2023-05-30 DIAGNOSIS — E440 Moderate protein-calorie malnutrition: Secondary | ICD-10-CM | POA: Diagnosis not present

## 2023-05-30 DIAGNOSIS — L8941 Pressure ulcer of contiguous site of back, buttock and hip, stage 1: Secondary | ICD-10-CM | POA: Diagnosis not present

## 2023-05-30 DIAGNOSIS — N1831 Chronic kidney disease, stage 3a: Secondary | ICD-10-CM | POA: Diagnosis not present

## 2023-05-30 DIAGNOSIS — G20A1 Parkinson's disease without dyskinesia, without mention of fluctuations: Secondary | ICD-10-CM | POA: Diagnosis not present

## 2023-05-30 DIAGNOSIS — R269 Unspecified abnormalities of gait and mobility: Secondary | ICD-10-CM | POA: Diagnosis not present

## 2023-06-04 DIAGNOSIS — E46 Unspecified protein-calorie malnutrition: Secondary | ICD-10-CM | POA: Diagnosis not present

## 2023-06-04 DIAGNOSIS — K219 Gastro-esophageal reflux disease without esophagitis: Secondary | ICD-10-CM | POA: Diagnosis not present

## 2023-06-04 DIAGNOSIS — Z8673 Personal history of transient ischemic attack (TIA), and cerebral infarction without residual deficits: Secondary | ICD-10-CM | POA: Diagnosis not present

## 2023-06-04 DIAGNOSIS — J45909 Unspecified asthma, uncomplicated: Secondary | ICD-10-CM | POA: Diagnosis not present

## 2023-06-04 DIAGNOSIS — G20A1 Parkinson's disease without dyskinesia, without mention of fluctuations: Secondary | ICD-10-CM | POA: Diagnosis not present

## 2023-06-04 DIAGNOSIS — F0150 Vascular dementia without behavioral disturbance: Secondary | ICD-10-CM | POA: Diagnosis not present

## 2023-06-04 DIAGNOSIS — N183 Chronic kidney disease, stage 3 unspecified: Secondary | ICD-10-CM | POA: Diagnosis not present

## 2023-06-04 DIAGNOSIS — D509 Iron deficiency anemia, unspecified: Secondary | ICD-10-CM | POA: Diagnosis not present

## 2023-06-04 DIAGNOSIS — I679 Cerebrovascular disease, unspecified: Secondary | ICD-10-CM | POA: Diagnosis not present

## 2023-06-05 DIAGNOSIS — F0150 Vascular dementia without behavioral disturbance: Secondary | ICD-10-CM | POA: Diagnosis not present

## 2023-06-05 DIAGNOSIS — N183 Chronic kidney disease, stage 3 unspecified: Secondary | ICD-10-CM | POA: Diagnosis not present

## 2023-06-05 DIAGNOSIS — G20A1 Parkinson's disease without dyskinesia, without mention of fluctuations: Secondary | ICD-10-CM | POA: Diagnosis not present

## 2023-06-05 DIAGNOSIS — I679 Cerebrovascular disease, unspecified: Secondary | ICD-10-CM | POA: Diagnosis not present

## 2023-06-05 DIAGNOSIS — Z8673 Personal history of transient ischemic attack (TIA), and cerebral infarction without residual deficits: Secondary | ICD-10-CM | POA: Diagnosis not present

## 2023-06-05 DIAGNOSIS — D509 Iron deficiency anemia, unspecified: Secondary | ICD-10-CM | POA: Diagnosis not present

## 2023-06-06 DIAGNOSIS — F0150 Vascular dementia without behavioral disturbance: Secondary | ICD-10-CM | POA: Diagnosis not present

## 2023-06-06 DIAGNOSIS — D509 Iron deficiency anemia, unspecified: Secondary | ICD-10-CM | POA: Diagnosis not present

## 2023-06-06 DIAGNOSIS — G20A1 Parkinson's disease without dyskinesia, without mention of fluctuations: Secondary | ICD-10-CM | POA: Diagnosis not present

## 2023-06-06 DIAGNOSIS — I679 Cerebrovascular disease, unspecified: Secondary | ICD-10-CM | POA: Diagnosis not present

## 2023-06-06 DIAGNOSIS — N183 Chronic kidney disease, stage 3 unspecified: Secondary | ICD-10-CM | POA: Diagnosis not present

## 2023-06-06 DIAGNOSIS — Z8673 Personal history of transient ischemic attack (TIA), and cerebral infarction without residual deficits: Secondary | ICD-10-CM | POA: Diagnosis not present

## 2023-06-07 DIAGNOSIS — G20A1 Parkinson's disease without dyskinesia, without mention of fluctuations: Secondary | ICD-10-CM | POA: Diagnosis not present

## 2023-06-07 DIAGNOSIS — F0150 Vascular dementia without behavioral disturbance: Secondary | ICD-10-CM | POA: Diagnosis not present

## 2023-06-07 DIAGNOSIS — I679 Cerebrovascular disease, unspecified: Secondary | ICD-10-CM | POA: Diagnosis not present

## 2023-06-07 DIAGNOSIS — D509 Iron deficiency anemia, unspecified: Secondary | ICD-10-CM | POA: Diagnosis not present

## 2023-06-07 DIAGNOSIS — N183 Chronic kidney disease, stage 3 unspecified: Secondary | ICD-10-CM | POA: Diagnosis not present

## 2023-06-07 DIAGNOSIS — Z8673 Personal history of transient ischemic attack (TIA), and cerebral infarction without residual deficits: Secondary | ICD-10-CM | POA: Diagnosis not present

## 2023-06-08 DIAGNOSIS — F0150 Vascular dementia without behavioral disturbance: Secondary | ICD-10-CM | POA: Diagnosis not present

## 2023-06-08 DIAGNOSIS — D509 Iron deficiency anemia, unspecified: Secondary | ICD-10-CM | POA: Diagnosis not present

## 2023-06-08 DIAGNOSIS — I679 Cerebrovascular disease, unspecified: Secondary | ICD-10-CM | POA: Diagnosis not present

## 2023-06-08 DIAGNOSIS — Z8673 Personal history of transient ischemic attack (TIA), and cerebral infarction without residual deficits: Secondary | ICD-10-CM | POA: Diagnosis not present

## 2023-06-08 DIAGNOSIS — N183 Chronic kidney disease, stage 3 unspecified: Secondary | ICD-10-CM | POA: Diagnosis not present

## 2023-06-08 DIAGNOSIS — G20A1 Parkinson's disease without dyskinesia, without mention of fluctuations: Secondary | ICD-10-CM | POA: Diagnosis not present

## 2023-06-13 DIAGNOSIS — R269 Unspecified abnormalities of gait and mobility: Secondary | ICD-10-CM | POA: Diagnosis not present

## 2023-06-13 DIAGNOSIS — G214 Vascular parkinsonism: Secondary | ICD-10-CM | POA: Diagnosis not present

## 2023-06-13 DIAGNOSIS — F01A0 Vascular dementia, mild, without behavioral disturbance, psychotic disturbance, mood disturbance, and anxiety: Secondary | ICD-10-CM | POA: Diagnosis not present

## 2023-06-13 DIAGNOSIS — W010XXA Fall on same level from slipping, tripping and stumbling without subsequent striking against object, initial encounter: Secondary | ICD-10-CM | POA: Diagnosis not present

## 2023-06-20 DIAGNOSIS — I1310 Hypertensive heart and chronic kidney disease without heart failure, with stage 1 through stage 4 chronic kidney disease, or unspecified chronic kidney disease: Secondary | ICD-10-CM | POA: Diagnosis not present

## 2023-06-20 DIAGNOSIS — R269 Unspecified abnormalities of gait and mobility: Secondary | ICD-10-CM | POA: Diagnosis not present

## 2023-06-20 DIAGNOSIS — N1831 Chronic kidney disease, stage 3a: Secondary | ICD-10-CM | POA: Diagnosis not present

## 2023-06-20 DIAGNOSIS — E440 Moderate protein-calorie malnutrition: Secondary | ICD-10-CM | POA: Diagnosis not present

## 2023-06-20 DIAGNOSIS — G214 Vascular parkinsonism: Secondary | ICD-10-CM | POA: Diagnosis not present

## 2023-06-26 DIAGNOSIS — F419 Anxiety disorder, unspecified: Secondary | ICD-10-CM | POA: Diagnosis not present

## 2023-06-26 DIAGNOSIS — G309 Alzheimer's disease, unspecified: Secondary | ICD-10-CM | POA: Diagnosis not present

## 2023-07-02 DIAGNOSIS — M1990 Unspecified osteoarthritis, unspecified site: Secondary | ICD-10-CM | POA: Diagnosis not present

## 2023-07-02 DIAGNOSIS — I130 Hypertensive heart and chronic kidney disease with heart failure and stage 1 through stage 4 chronic kidney disease, or unspecified chronic kidney disease: Secondary | ICD-10-CM | POA: Diagnosis not present

## 2023-10-16 ENCOUNTER — Ambulatory Visit: Payer: Medicare Other | Admitting: Dermatology

## 2023-11-08 DEATH — deceased
